# Patient Record
Sex: Male | Born: 1950 | Race: White | Hispanic: No | Marital: Married | Smoking: Former smoker
Health system: Southern US, Community
[De-identification: ages and names within clinical notes are randomized; demographics above are authoritative.]

## PROBLEM LIST (undated history)

## (undated) DIAGNOSIS — G4730 Sleep apnea, unspecified: Secondary | ICD-10-CM

## (undated) DIAGNOSIS — Z973 Presence of spectacles and contact lenses: Secondary | ICD-10-CM

## (undated) DIAGNOSIS — I10 Essential (primary) hypertension: Secondary | ICD-10-CM

## (undated) DIAGNOSIS — R351 Nocturia: Secondary | ICD-10-CM

## (undated) DIAGNOSIS — M069 Rheumatoid arthritis, unspecified: Secondary | ICD-10-CM

## (undated) DIAGNOSIS — E785 Hyperlipidemia, unspecified: Secondary | ICD-10-CM

## (undated) DIAGNOSIS — T7840XA Allergy, unspecified, initial encounter: Secondary | ICD-10-CM

## (undated) DIAGNOSIS — I4891 Unspecified atrial fibrillation: Secondary | ICD-10-CM

## (undated) DIAGNOSIS — Z86010 Personal history of colonic polyps: Secondary | ICD-10-CM

## (undated) DIAGNOSIS — J301 Allergic rhinitis due to pollen: Secondary | ICD-10-CM

## (undated) DIAGNOSIS — K635 Polyp of colon: Secondary | ICD-10-CM

## (undated) DIAGNOSIS — I714 Abdominal aortic aneurysm, without rupture, unspecified: Secondary | ICD-10-CM

## (undated) DIAGNOSIS — I2510 Atherosclerotic heart disease of native coronary artery without angina pectoris: Secondary | ICD-10-CM

## (undated) DIAGNOSIS — I499 Cardiac arrhythmia, unspecified: Secondary | ICD-10-CM

## (undated) HISTORY — DX: Cardiac arrhythmia, unspecified: I49.9

## (undated) HISTORY — PX: INGUINAL HERNIA REPAIR: SUR1180

## (undated) HISTORY — DX: Essential (primary) hypertension: I10

## (undated) HISTORY — DX: Allergy, unspecified, initial encounter: T78.40XA

## (undated) HISTORY — DX: Hyperlipidemia, unspecified: E78.5

## (undated) HISTORY — DX: Abdominal aortic aneurysm, without rupture: I71.4

## (undated) HISTORY — DX: Atherosclerotic heart disease of native coronary artery without angina pectoris: I25.10

## (undated) HISTORY — DX: Personal history of colonic polyps: Z86.010

## (undated) HISTORY — PX: COLONOSCOPY: SHX174

## (undated) HISTORY — DX: Rheumatoid arthritis, unspecified: M06.9

## (undated) HISTORY — PX: TONSILLECTOMY: SUR1361

## (undated) HISTORY — DX: Abdominal aortic aneurysm, without rupture, unspecified: I71.40

## (undated) HISTORY — DX: Unspecified atrial fibrillation: I48.91

---

## 1997-09-30 ENCOUNTER — Encounter: Payer: Self-pay | Admitting: Internal Medicine

## 1999-11-06 ENCOUNTER — Ambulatory Visit (HOSPITAL_COMMUNITY): Admission: RE | Admit: 1999-11-06 | Discharge: 1999-11-06 | Payer: Self-pay | Admitting: Internal Medicine

## 2004-11-06 ENCOUNTER — Encounter: Admission: RE | Admit: 2004-11-06 | Discharge: 2004-11-06 | Payer: Self-pay | Admitting: Rheumatology

## 2004-11-13 ENCOUNTER — Encounter: Admission: RE | Admit: 2004-11-13 | Discharge: 2004-11-13 | Payer: Self-pay | Admitting: Rheumatology

## 2008-06-11 ENCOUNTER — Ambulatory Visit: Payer: Self-pay | Admitting: Internal Medicine

## 2008-06-24 ENCOUNTER — Ambulatory Visit: Payer: Self-pay | Admitting: Internal Medicine

## 2008-06-24 ENCOUNTER — Encounter: Payer: Self-pay | Admitting: Internal Medicine

## 2008-06-30 ENCOUNTER — Encounter: Payer: Self-pay | Admitting: Internal Medicine

## 2009-06-16 ENCOUNTER — Encounter: Payer: Self-pay | Admitting: Internal Medicine

## 2009-06-23 ENCOUNTER — Encounter (INDEPENDENT_AMBULATORY_CARE_PROVIDER_SITE_OTHER): Payer: Self-pay | Admitting: *Deleted

## 2009-10-17 ENCOUNTER — Encounter (INDEPENDENT_AMBULATORY_CARE_PROVIDER_SITE_OTHER): Payer: Self-pay | Admitting: *Deleted

## 2009-11-24 ENCOUNTER — Encounter (INDEPENDENT_AMBULATORY_CARE_PROVIDER_SITE_OTHER): Payer: Self-pay | Admitting: *Deleted

## 2009-11-25 ENCOUNTER — Ambulatory Visit: Payer: Self-pay | Admitting: Internal Medicine

## 2009-12-09 ENCOUNTER — Ambulatory Visit: Payer: Self-pay | Admitting: Internal Medicine

## 2009-12-16 ENCOUNTER — Encounter: Payer: Self-pay | Admitting: Internal Medicine

## 2009-12-21 ENCOUNTER — Telehealth: Payer: Self-pay | Admitting: Internal Medicine

## 2010-10-10 NOTE — Letter (Signed)
Summary: Patient Notice- Polyp Results  [name]  [address] [place], [place] [address]   Phone: [phone]  Fax: [phone]        December 16, 2009 MRN: [id number]    Frederick Bridges [address] [place], [place]  [address]    Dear [name],  I am pleased to inform you that the colon polyp(s) removed during your recent colonoscopy was (were) found to be benign (no cancer detected) upon pathologic examination.  Due to these findings and your history of numerous polyps in 2009, I recommend you have a repeat colonoscopy examination in 3 years to look for recurrent polyps.  Should you develop new or worsening symptoms of abdominal pain, bowel habit changes or bleeding from the rectum or bowels, please schedule an evaluation with either your primary care physician or with me.  Please call us if you are having persistent problems or have questions about your condition that have not been fully answered at this time.  Sincerely,  Iva Boop MD, [facility]  This letter has been electronically signed by your physician.  Appended Document: Patient Notice- Polyp Results letter mailed 4.11.11

## 2010-10-10 NOTE — Letter (Signed)
Summary: Previsit letter  [facility]  [address] [place], [place] [address]   Phone: [phone]  Fax: [phone]       10/17/2009 MRN: [id number]  Frederick Bridges [address] [place], [place]  [address]  Dear Mr. Riffel,  Welcome to the Gastroenterology Division at [facility].    You are scheduled to see a nurse for your pre-procedure visit on 11-25-09 at 8:00a.m. on the 3rd floor at [facility], [address]  We ask that you try to arrive at our office 15 minutes prior to your appointment time to allow for check-in.  Your nurse visit will consist of discussing your medical and surgical history, your immediate family medical history, and your medications.    Please bring a complete list of all your medications or, if you prefer, bring the medication bottles and we will list them.  We will need to be aware of both prescribed and over the counter drugs.  We will need to know exact dosage information as well.  If you are on blood thinners (Coumadin, Plavix, Aggrenox, Ticlid, etc.) please call our office today/prior to your appointment, as we need to consult with your physician about holding your medication.   Please be prepared to read and sign documents such as consent forms, a financial agreement, and acknowledgement forms.  If necessary, and with your consent, a friend or relative is welcome to sit-in on the nurse visit with you.  Please bring your insurance card so that we may make a copy of it.  If your insurance requires a referral to see a specialist, please bring your referral form from your primary care physician.  No co-pay is required for this nurse visit.     If you cannot keep your appointment, please call [phone] to cancel or reschedule prior to your appointment date.  This allows [place] the opportunity to schedule an appointment for another patient in need of care.    Thank you for choosing [facility] for your medical  needs.  We appreciate the opportunity to care for you.  Please visit [place] at our website  to learn more about our practice.                     Sincerely.                                                                                                                   The Gastroenterology Division

## 2010-10-10 NOTE — Progress Notes (Signed)
Summary: call to pt re: genetic testing  Phone Note Outgoing Call   Call placed by: Iva Boop MD, Clementeen Graham,  December 21, 2009 12:02 PM Summary of Call: left message re: discuss possible genetic testing' I will call back his cell [phone]  Follow-up for Phone Call        I explained that the # and type of polyps he has had are an indication to have genetic screening and blood testing (> attenuated FAP or MYH abnormalities) and that it could have implications for his famuily. will send him the info in the mail for him to read. He is not willing to proceed now but will consider Follow-up by: Iva Boop MD, Clementeen Graham,  December 22, 2009 5:38 PM     Appended Document: call to pt re: genetic testing Pamphlet mailed to patient's home

## 2010-10-10 NOTE — Letter (Signed)
Summary: Moviprep Instructions  [facility]  [address]   [place], [place] [address]   Phone: [phone]  Fax: [phone]       Frederick Bridges    10-14-1950    MRN: [id number]        Procedure Day Dorna Bloom: 12-09-09, Friday     Arrival Time: 8:30 a.m.     Procedure Time: 9:30 a.m.     Location of Procedure:                    x    Endoscopy Center (4th Floor)                        PREPARATION FOR COLONOSCOPY WITH MOVIPREP   Starting 5 days prior to your procedure 12-04-09  do not eat nuts, seeds, popcorn, corn, beans, peas,  salads, or any raw vegetables.  Do not take any fiber supplements (e.g. Metamucil, Citrucel, and Benefiber).  THE DAY BEFORE YOUR PROCEDURE         DATE: 12-08-09   DAY: Thursday  1.  Drink clear liquids the entire day-NO SOLID FOOD  2.  Do not drink anything colored red or purple.  Avoid juices with pulp.  No orange juice.  3.  Drink at least 64 oz. (8 glasses) of fluid/clear liquids during the day to prevent dehydration and help the prep work efficiently. No alcohol beverages.  CLEAR LIQUIDS INCLUDE: Water Jello Ice Popsicles Tea (sugar ok, no milk/cream) Powdered fruit flavored drinks Coffee (sugar ok, no milk/cream) Gatorade Juice: apple, white grape, white cranberry  Lemonade Clear bullion, consomm, broth Carbonated beverages (any kind) Strained chicken noodle soup Hard Candy                             4.  In the morning, mix first dose of MoviPrep solution:    Empty 1 Pouch A and 1 Pouch B into the disposable container    Add lukewarm drinking water to the top line of the container. Mix to dissolve    Refrigerate (mixed solution should be used within 24 hrs)  5.  Begin drinking the prep at 5:00 p.m. The MoviPrep container is divided by 4 marks.   Every 15 minutes drink the solution down to the next mark (approximately 8 oz) until the full liter is complete.   6.  Follow completed prep with 16 oz of clear  liquid of your choice (Nothing red or purple).  Continue to drink clear liquids until bedtime.  7.  Before going to bed, mix second dose of MoviPrep solution:    Empty 1 Pouch A and 1 Pouch B into the disposable container    Add lukewarm drinking water to the top line of the container. Mix to dissolve    Refrigerate  THE DAY OF YOUR PROCEDURE      DATE: 12-09-09   DAY: Friday  Beginning at 3:00 a.m. (5 hours before procedure):         1. Every 15 minutes, drink the solution down to the next mark (approx 8 oz) until the full liter is complete.  2. Follow completed prep with 16 oz. of clear liquid of your choice.    3. You may drink clear liquids until 6:00 a.m.  (2 HOURS BEFORE PROCEDURE).   MEDICATION INSTRUCTIONS  Unless otherwise instructed, you should take regular prescription medications with a small sip of water  as early as possible the morning of your procedure.   Additional medication instructions: n/a         OTHER INSTRUCTIONS  You will need a responsible adult at least 59 years of age to accompany you and drive you home.   This person must remain in the waiting room during your procedure.  Wear loose fitting clothing that is easily removed.  Leave jewelry and other valuables at home.  However, you may wish to bring a book to read or  an iPod/MP3 player to listen to music as you wait for your procedure to start.  Remove all body piercing jewelry and leave at home.  Total time from sign-in until discharge is approximately 2-3 hours.  You should go home directly after your procedure and rest.  You can resume normal activities the  day after your procedure.  The day of your procedure you should not:   Drive   Make legal decisions   Operate machinery   Drink alcohol   Return to work  You will receive specific instructions about eating, activities and medications before you leave.    The above instructions have been reviewed and explained to me by    Sherren Kerns RN  November 25, 2009 8:14 AM    I fully understand and can verbalize these instructions _____________________________ Date _________

## 2010-10-10 NOTE — Procedures (Signed)
Summary: Colonoscopy  Patient: Frederick Bridges Note: All result statuses are Final unless otherwise noted.  Tests: (1) Colonoscopy (COL)   COL Colonoscopy           DONE      Endoscopy Center     [address]     [place], [place]  [address]           COLONOSCOPY PROCEDURE REPORT           PATIENT:  [name], [name]  MR#:  [id number]     BIRTHDATE:  1951/07/17, 58 yrs. old  GENDER:  male     ENDOSCOPIST:  Iva Boop, MD, [facility]           PROCEDURE DATE:  12/09/2009     PROCEDURE:  Colonoscopy with biopsy and snare polypectomy     ASA CLASS:  Class II     INDICATIONS:  history of pre-cancerous (adenomatous) colon polyps,     surveillance and screening 15 polyps removed 10/09     for closer follow-up due to # of polyps, all recivered were     adenomas or sessile serrated polyps     he is also immunosuppressed on Embral for rheumatoid arthritis     MEDICATIONS:   Fentanyl 75 mcg IV, Versed 8 mg IV           DESCRIPTION OF PROCEDURE:   After the risks benefits and     alternatives of the procedure were thoroughly explained, informed     consent was obtained.  Digital rectal exam was performed and     revealed no abnormalities and normal prostate.   The LB [id number]     [id number] endoscope was introduced through the anus and advanced to     the cecum, which was identified by both the appendix and ileocecal     valve, without limitations.  The quality of the prep was     excellent, using MoviPrep.  The instrument was then slowly     withdrawn as the colon was fully examined.     Insertion: 2:06 minutes Withdrawal: 20:25 minutes     <<PROCEDUREIMAGES>>           FINDINGS:  Four polyps were found. 3mm ascending polyp, 12 mm     hepatic flexure polyp (identified by right colon retroflex exam),     10 mm hepatic flexure polyp, 7 mm transverse polyp. The 3 mm polyp     was removed using cold biopsy forceps. The other polyps were     snared, then cauterized with monopolar cautery.  Retrieval was     successful. Mild diverticulosis was found in the sigmoid colon.     This was otherwise a normal examination of the colon.   Retroflexed     views in the rectum revealed no abnormalities.    The scope was     then withdrawn from the patient and the procedure completed.           COMPLICATIONS:  None     ENDOSCOPIC IMPRESSION:     1) Four polyps removed (48mm-12mm sizes)     2) Mild diverticulosis in the sigmoid colon     3) Otherwise normal examination           4) 15 polyps removed 10/09 (adenomas and sessile serrated     polyps)           RECOMMENDATIONS:     Consider genetic testing pending pathology review.  REPEAT EXAM:  In for Colonoscopy, pending biopsy results.           Iva Boop, MD, Clementeen Graham           CC:  Jarome Matin, MD     Zenovia Jordan, MD     The Patient           n.     eSIGNED:   Iva Boop at 12/09/2009 09:51 AM           Evette Doffing, [id number]  Note: An exclamation mark (!) indicates a result that was not dispersed into the flowsheet. Document Creation Date: 12/09/2009 9:52 AM _______________________________________________________________________  (1) Order result status: Final Collection or observation date-time: 12/09/2009 09:40 Requested date-time:  Receipt date-time:  Reported date-time:  Referring Physician:   Ordering Physician: Stan Head [phone]) Specimen Source:  Source: Launa Grill Order Number: [id number] Lab site:   Appended Document: Colonoscopy     Procedures Next Due Date:    Colonoscopy: 12/2012

## 2010-10-10 NOTE — Letter (Signed)
Summary: CVTS  CVTS   Imported By: Lester Jay 06/15/2010 09:02:41  _____________________________________________________________________  External Attachment:    Type:   Image     Comment:   External Document

## 2010-10-10 NOTE — Procedures (Signed)
Summary: Recall / [facility]  Recall / [facility]   Imported By: Lennie Odor 02/09/2010 15:55:22  _____________________________________________________________________  External Attachment:    Type:   Image     Comment:   External Document

## 2010-10-10 NOTE — Miscellaneous (Signed)
Summary: previsit/rm  Clinical Lists Changes  Medications: Added new medication of MOVIPREP 100 GM  SOLR (PEG-KCL-NACL-NASULF-NA ASC-C) As per prep instructions. - Signed Rx of MOVIPREP 100 GM  SOLR (PEG-KCL-NACL-NASULF-NA ASC-C) As per prep instructions.;  #1 x 0;  Signed;  Entered by: Sherren Kerns RN;  Authorized by: Iva Boop MD, [facility];  Method used: Electronically to [facility] # [phone]*, [address], [place], [place]  [address], Ph: [phone], Fax: [phone] Observations: Added new observation of ALLERGY REV: Done (11/25/2009 7:50)    Prescriptions: MOVIPREP 100 GM  SOLR (PEG-KCL-NACL-NASULF-NA ASC-C) As per prep instructions.  #1 x 0   Entered by:   Sherren Kerns RN   Authorized by:   Iva Boop MD, [facility]   Signed by:   Sherren Kerns RN on 11/25/2009   Method used:   Electronically to        [facility] # [phone]* (retail)       [address]       [place], [place]  [address]       Ph: [phone]       Fax: [phone]   RxID:   [id number]

## 2012-12-22 ENCOUNTER — Encounter: Payer: Self-pay | Admitting: Internal Medicine

## 2012-12-22 DIAGNOSIS — Z860100 Personal history of colon polyps, unspecified: Secondary | ICD-10-CM

## 2012-12-22 HISTORY — DX: Personal history of colon polyps, unspecified: Z86.0100

## 2012-12-22 HISTORY — DX: Personal history of colonic polyps: Z86.010

## 2012-12-23 ENCOUNTER — Encounter: Payer: Self-pay | Admitting: Internal Medicine

## 2013-07-14 ENCOUNTER — Encounter: Payer: Self-pay | Admitting: Internal Medicine

## 2014-03-10 ENCOUNTER — Encounter: Payer: Self-pay | Admitting: Internal Medicine

## 2014-04-30 ENCOUNTER — Ambulatory Visit (AMBULATORY_SURGERY_CENTER): Payer: Self-pay

## 2014-04-30 VITALS — Ht 70.0 in | Wt 213.0 lb

## 2014-04-30 DIAGNOSIS — Z860100 Personal history of colon polyps, unspecified: Secondary | ICD-10-CM

## 2014-04-30 MED ORDER — SUPREP BOWEL PREP KIT 17.5-3.13-1.6 GM/177ML PO SOLN: 1.0000 | Freq: Once | ORAL | Status: DC

## 2014-04-30 NOTE — Progress Notes (Signed)
No allergies to eggs or soy No past problems with anesthesia No home oxygen No diet/weight loss meds  Has email  Emmi instructions given for colonoscopy 

## 2014-05-14 ENCOUNTER — Ambulatory Visit (AMBULATORY_SURGERY_CENTER): Payer: 59 | Admitting: Internal Medicine

## 2014-05-14 ENCOUNTER — Encounter: Payer: Self-pay | Admitting: Internal Medicine

## 2014-05-14 VITALS — BP 117/74 | HR 55 | Temp 97.5°F | Resp 19 | Ht 70.0 in | Wt 213.0 lb

## 2014-05-14 DIAGNOSIS — D126 Benign neoplasm of colon, unspecified: Secondary | ICD-10-CM

## 2014-05-14 DIAGNOSIS — D125 Benign neoplasm of sigmoid colon: Secondary | ICD-10-CM

## 2014-05-14 DIAGNOSIS — D120 Benign neoplasm of cecum: Secondary | ICD-10-CM

## 2014-05-14 DIAGNOSIS — Z86010 Personal history of colonic polyps: Secondary | ICD-10-CM

## 2014-05-14 MED ORDER — SODIUM CHLORIDE 0.9 % IV SOLN: 500.0000 mL | INTRAVENOUS | Status: DC

## 2014-05-14 NOTE — Patient Instructions (Addendum)
I found and removed 2 tiny polyps. I will let you know pathology results and when to have another routine colonoscopy by mail. Suspect it will be 2020.  I appreciate the opportunity to care for you. Gatha Mayer, MD, FACG  YOU HAD AN ENDOSCOPIC PROCEDURE TODAY AT [facility]: Refer to the procedure report that was given to you for any specific questions about what was found during the examination.  If the procedure report does not answer your questions, please call your gastroenterologist to clarify.  If you requested that your care partner not be given the details of your procedure findings, then the procedure report has been included in a sealed envelope for you to review at your convenience later.  YOU SHOULD EXPECT: Some feelings of bloating in the abdomen. Passage of more gas than usual.  Walking can help get rid of the air that was put into your GI tract during the procedure and reduce the bloating. If you had a lower endoscopy (such as a colonoscopy or flexible sigmoidoscopy) you may notice spotting of blood in your stool or on the toilet paper. If you underwent a bowel prep for your procedure, then you may not have a normal bowel movement for a few days.  DIET: Your first meal following the procedure should be a light meal and then it is ok to progress to your normal diet.  A half-sandwich or bowl of soup is an example of a good first meal.  Heavy or fried foods are harder to digest and may make you feel nauseous or bloated.  Likewise meals heavy in dairy and vegetables can cause extra gas to form and this can also increase the bloating.  Drink plenty of fluids but you should avoid alcoholic beverages for 24 hours.  ACTIVITY: Your care partner should take you home directly after the procedure.  You should plan to take it easy, moving slowly for the rest of the day.  You can resume normal activity the day after the procedure however you should NOT DRIVE or use heavy machinery  for 24 hours (because of the sedation medicines used during the test).    SYMPTOMS TO REPORT IMMEDIATELY: A gastroenterologist can be reached at any hour.  During normal business hours, 8:30 AM to 5:00 PM Monday through Friday, call [phone].  After hours and on weekends, please call the GI answering service at [phone] who will take a message and have the physician on call contact you.   Following lower endoscopy (colonoscopy or flexible sigmoidoscopy):  Excessive amounts of blood in the stool  Significant tenderness or worsening of abdominal pains  Swelling of the abdomen that is new, acute  Fever of 100F or higher  Following upper endoscopy (EGD)  Vomiting of blood or coffee ground material  New chest pain or pain under the shoulder blades  Painful or persistently difficult swallowing  New shortness of breath  Fever of 100F or higher  Black, tarry-looking stools  FOLLOW UP: If any biopsies were taken you will be contacted by phone or by letter within the next 1-3 weeks.  Call your gastroenterologist if you have not heard about the biopsies in 3 weeks.  Our staff will call the home number listed on your records the next business day following your procedure to check on you and address any questions or concerns that you may have at that time regarding the information given to you following your procedure. This is a courtesy call and  so if there is no answer at the home number and we have not heard from you through the emergency physician on call, we will assume that you have returned to your regular daily activities without incident.  SIGNATURES/CONFIDENTIALITY: You and/or your care partner have signed paperwork which will be entered into your electronic medical record.  These signatures attest to the fact that that the information above on your After Visit Summary has been reviewed and is understood.  Full responsibility of the confidentiality of this discharge information  lies with you and/or your care-partner.    Information on polyps given to you today

## 2014-05-14 NOTE — Progress Notes (Signed)
Report to PACU, RN, vss, BBS= Clear.  

## 2014-05-14 NOTE — Op Note (Signed)
[place]  [address] [place], [address]   COLONOSCOPY PROCEDURE REPORT  PATIENT: Frederick, Bridges  MR#: [id number] BIRTHDATE: [birth year] , 62  yrs. old GENDER: Male ENDOSCOPIST: Gatha Mayer, MD, [facility] PROCEDURE DATE:  05/14/2014 PROCEDURE:   Colonoscopy with biopsy and snare polypectomy First Screening Colonoscopy - Avg.  risk and is 50 yrs.  old or older - No.  Prior Negative Screening - Now for repeat screening. N/A  History of Adenoma - Now for follow-up colonoscopy & has been > or = to 3 yrs.  Yes hx of adenoma.  Has been 3 or more years since last colonoscopy.  Polyps Removed Today? Yes. ASA CLASS:   Class II INDICATIONS:Patient's personal history of adenomatous colon polyps.  MEDICATIONS: Propofol (Diprivan) 220 mg IV, MAC sedation, administered by CRNA, and These medications were titrated to patient response per physician's verbal order  DESCRIPTION OF PROCEDURE:   After the risks benefits and alternatives of the procedure were thoroughly explained, informed consent was obtained.  A digital rectal exam revealed no abnormalities of the rectum, A digital rectal exam revealed no prostatic nodules, and A digital rectal exam revealed the prostate was not enlarged.   The LB [id number]  endoscope was introduced through the anus and advanced to the cecum, which was identified by both the appendix and ileocecal valve. No adverse events experienced.   The quality of the prep was Suprep good  The instrument was then slowly withdrawn as the colon was fully examined.  COLON FINDINGS: Two sessile polyps measuring 2 and 5 mm in size were found at the cecum and in the sigmoid colon.  A polypectomy was performed with cold forceps and with a cold snare.  The resection was complete and the polyp tissue was completely retrieved.   The colon mucosa was otherwise normal.  Retroflexed views revealed no abnormalities. The time to cecum=2 minutes 54 seconds.   Withdrawal time=10 minutes 02 seconds.  The scope was withdrawn and the procedure completed. COMPLICATIONS: There were no complications.  ENDOSCOPIC IMPRESSION: 1.   Two sessile polyps measuring 2 and 5 mm in size were found at the cecum and in the sigmoid colon; polypectomy was performed with cold forceps and with a cold snare 2.   The colon mucosa was otherwise normal - hx adenomas and SSP's - 2009 and 2011  RECOMMENDATIONS: 1.  Timing of repeat colonoscopy will be determined by pathology findings. 2.   Probably 5 years   eSigned:  Gatha Mayer, MD, [facility] 05/14/2014 2:22 PM   cc: The Patient  and Janie Morning, MD

## 2014-05-14 NOTE — Progress Notes (Signed)
Called to room to assist during endoscopic procedure.  Patient ID and intended procedure confirmed with present staff. Received instructions for my participation in the procedure from the performing physician.  

## 2014-05-18 ENCOUNTER — Telehealth: Payer: Self-pay

## 2014-05-18 NOTE — Telephone Encounter (Signed)
Left a message at [phone] for the pt to call back if any questions or concerns. maw

## 2014-05-20 ENCOUNTER — Encounter: Payer: Self-pay | Admitting: Internal Medicine

## 2014-05-20 NOTE — Progress Notes (Signed)
Quick Note:  2 hyperplastic polyps - one cecum and one sigmoid Repeat colon 2020 - hx ssp's in past, ? If cecal polyp ssp now ______

## 2014-06-10 ENCOUNTER — Encounter: Payer: Self-pay | Admitting: Internal Medicine

## 2015-06-28 ENCOUNTER — Other Ambulatory Visit (HOSPITAL_COMMUNITY): Payer: Self-pay | Admitting: Surgery

## 2015-07-11 ENCOUNTER — Encounter (HOSPITAL_COMMUNITY)
Admission: RE | Admit: 2015-07-11 | Discharge: 2015-07-11 | Disposition: A | Discharge status: Home / Self Care | Payer: 59 | Source: Ambulatory Visit | Attending: Surgery | Admitting: Surgery

## 2015-07-11 MED ORDER — TECHNETIUM TC 99M SESTAMIBI - CARDIOLITE
26.9000 | Freq: Once | INTRAVENOUS | Status: AC | PRN
  Administered 2015-07-11: 09:00:00 26.9 via INTRAVENOUS

## 2015-07-25 ENCOUNTER — Ambulatory Visit: Payer: Self-pay | Admitting: Surgery

## 2015-08-25 NOTE — Pre-Procedure Instructions (Signed)
    Emiel Leong Lim  08/25/2015      [id number] - [place], [place] - [address] AT [address] [place] [address] Phone: [phone] Fax: [phone]    Your procedure is scheduled on Friday, September 02, 2015  Report to [facility] Admitting at 5:30 A.M.  Call this number if you have problems the morning of surgery:  [phone]   Remember:  Do not eat food or drink liquids after midnight Thursday, September 01, 2015  Take these medicines the morning of surgery with A SIP OF WATER : NONE  Stop taking Aspirin,vitamins, fish oil, and herbal medications. Do not take any NSAIDs ie: Ibuprofen, Advil, Naproxen or any medication containing Aspirin; stop now.   Do not wear jewelry, make-up or nail polish.  Do not wear lotions, powders, or perfumes.  You may wear deodorant.  Do not shave 48 hours prior to surgery.  Men may shave face and neck.  Do not bring valuables to the hospital.  [facility] is not responsible for any belongings or valuables.  Contacts, dentures or bridgework may not be worn into surgery.  Leave your suitcase in the car.  After surgery it may be brought to your room.  For patients admitted to the hospital, discharge time will be determined by your treatment team.  Patients discharged the day of surgery will not be allowed to drive home.   Name and phone number of your driver:   Special instructions: Shower the night before surgery and the morning of surgery with CHG.  Please read over the following fact sheets that you were given. Pain Booklet, Coughing and Deep Breathing and Surgical Site Infection Prevention

## 2015-08-26 ENCOUNTER — Encounter (HOSPITAL_COMMUNITY)
Admission: RE | Admit: 2015-08-26 | Discharge: 2015-08-26 | Disposition: A | Discharge status: Home / Self Care | Payer: 59 | Source: Ambulatory Visit | Attending: Surgery | Admitting: Surgery

## 2015-08-26 ENCOUNTER — Encounter (HOSPITAL_COMMUNITY): Payer: Self-pay

## 2015-08-26 DIAGNOSIS — Z01812 Encounter for preprocedural laboratory examination: Secondary | ICD-10-CM | POA: Insufficient documentation

## 2015-08-26 DIAGNOSIS — E210 Primary hyperparathyroidism: Secondary | ICD-10-CM | POA: Insufficient documentation

## 2015-08-26 HISTORY — DX: Presence of spectacles and contact lenses: Z97.3

## 2015-08-26 HISTORY — DX: Polyp of colon: K63.5

## 2015-08-26 HISTORY — DX: Allergic rhinitis due to pollen: J30.1

## 2015-08-26 HISTORY — DX: Nocturia: R35.1

## 2015-08-26 LAB — COMPREHENSIVE METABOLIC PANEL
ALT: 19 U/L (ref 17–63)
AST: 20 U/L (ref 15–41)
Albumin: 3.8 g/dL (ref 3.5–5.0)
Alkaline Phosphatase: 63 U/L (ref 38–126)
Anion gap: 8 (ref 5–15)
BUN: 14 mg/dL (ref 6–20)
CO2: 24 mmol/L (ref 22–32)
Calcium: 11 mg/dL — ABNORMAL HIGH (ref 8.9–10.3)
Chloride: 105 mmol/L (ref 101–111)
Creatinine, Ser: 0.83 mg/dL (ref 0.61–1.24)
GFR calc Af Amer: >60 mL/min (ref >60)
GFR calc non Af Amer: >60 mL/min (ref >60)
Glucose, Bld: 109 mg/dL — ABNORMAL HIGH (ref 65–99)
Potassium: 4.3 mmol/L (ref 3.5–5.1)
Sodium: 137 mmol/L (ref 135–145)
Total Bilirubin: 0.6 mg/dL (ref 0.3–1.2)
Total Protein: 7.3 g/dL (ref 6.5–8.1)

## 2015-08-26 LAB — CBC
HCT: 46.5 % (ref 39.0–52.0)
Hemoglobin: 15.6 g/dL (ref 13.0–17.0)
MCH: 31.1 pg (ref 26.0–34.0)
MCHC: 33.5 g/dL (ref 30.0–36.0)
MCV: 92.8 fL (ref 78.0–100.0)
Platelets: 263 10*3/uL (ref 150–400)
RBC: 5.01 MIL/uL (ref 4.22–5.81)
RDW: 12.6 % (ref 11.5–15.5)
WBC: 6.7 10*3/uL (ref 4.0–10.5)

## 2015-08-26 NOTE — Progress Notes (Addendum)
PCP - Dr. [name] at [facility] Cardiologist - denies  EKG/CXR - denies Echo - denies Stress test- requested from Dr. Terance Hart Cardiac cath - denies  Patient denies chest pain and shortness of breath at PAT appointment.

## 2015-08-30 ENCOUNTER — Encounter (HOSPITAL_COMMUNITY): Payer: Self-pay | Admitting: Surgery

## 2015-08-30 DIAGNOSIS — E210 Primary hyperparathyroidism: Secondary | ICD-10-CM | POA: Diagnosis present

## 2015-08-30 NOTE — H&P (Signed)
General Surgery [facility]  Frederick Bridges DOB: [birth year] Married / Language: English / Race: White Male  History of Present Illness The patient is a 64 year old male who presents with a parathyroid neoplasm.  Patient referred by Dr. Leanna Bridges for evaluation of suspected primary hyperparathyroidism. Patient was noted over the past several years to have elevated serum calcium levels ranging from 10.8-11.9. Patient had an intact PTH level checked which was in the upper range of normal at 48. Patient has had no other diagnostic studies. Patient denies any prior history of endocrine disease. He has had no prior head or neck surgery. There is no family history of endocrine disease and no family history of endocrine neoplasm. Patient notes mild fatigue. He does note bone and joint pain. He has had a bone density scan a few years ago which showed osteopenia. He has had one episode of nephrolithiasis. Patient presents today for further evaluation and recommendations.   Other Problems Arthritis Back Pain Hemorrhoids Kidney Stone Umbilical Hernia Repair  Past Surgical History  Colon Polyp Removal - Colonoscopy Oral Surgery Tonsillectomy Ventral / Umbilical Hernia Surgery Left.  Diagnostic Studies History  Colonoscopy within last year  Allergies No Known Drug Allergies10/17/2016  Medication History Etanercept (50MG /ML Solution, Subcutaneous) Active. Multiple Vitamin (Oral once a week) Active. Medications Reconciled  Social History Alcohol use Moderate alcohol use. Caffeine use Carbonated beverages, Coffee, Tea. Illicit drug use Remotely quit drug use. Tobacco use Former smoker.  Family History Arthritis Mother. Cancer Brother, Mother. Diabetes Mellitus Daughter. Respiratory Condition Brother, Mother.  Review of Systems General Present- Weight Gain. Not Present- Appetite Loss, Chills, Fatigue, Fever, Night Sweats  and Weight Loss. Skin Not Present- Change in Wart/Mole, Dryness, Hives, Jaundice, New Lesions, Non-Healing Wounds, Rash and Ulcer. HEENT Present- Seasonal Allergies and Wears glasses/contact lenses. Not Present- Earache, Hearing Loss, Hoarseness, Nose Bleed, Oral Ulcers, Ringing in the Ears, Sinus Pain, Sore Throat, Visual Disturbances and Yellow Eyes. Respiratory Present- Snoring. Not Present- Bloody sputum, Chronic Cough, Difficulty Breathing and Wheezing. Breast Not Present- Breast Mass, Breast Pain, Nipple Discharge and Skin Changes. Cardiovascular Not Present- Chest Pain, Difficulty Breathing Lying Down, Leg Cramps, Palpitations, Rapid Heart Rate, Shortness of Breath and Swelling of Extremities. Gastrointestinal Present- Hemorrhoids. Not Present- Abdominal Pain, Bloating, Bloody Stool, Change in Bowel Habits, Chronic diarrhea, Constipation, Difficulty Swallowing, Excessive gas, Gets full quickly at meals, Indigestion, Nausea, Rectal Pain and Vomiting. Male Genitourinary Not Present- Blood in Urine, Change in Urinary Stream, Frequency, Impotence, Nocturia, Painful Urination, Urgency and Urine Leakage. Musculoskeletal Present- Back Pain, Joint Stiffness and Muscle Pain. Not Present- Joint Pain, Muscle Weakness and Swelling of Extremities. Neurological Not Present- Decreased Memory, Fainting, Headaches, Numbness, Seizures, Tingling, Tremor, Trouble walking and Weakness. Psychiatric Not Present- Anxiety, Bipolar, Change in Sleep Pattern, Depression, Fearful and Frequent crying. Endocrine Not Present- Cold Intolerance, Excessive Hunger, Hair Changes, Heat Intolerance, Hot flashes and New Diabetes. Hematology Not Present- Easy Bruising, Excessive bleeding, Gland problems, HIV and Persistent Infections.  Vitals Weight: 221 lb Height: 70in Body Surface Area: 2.18 m Body Mass Index: 31.71 kg/m  Temp.: 97.59F(Temporal)  Pulse: 88 (Regular)  BP: 130/70 (Sitting, Left Arm,  Standard)   Physical Exam   General - appears comfortable, no distress; not diaphorectic  HEENT - normocephalic; sclerae clear, gaze conjugate; mucous membranes moist, dentition good; voice normal  Neck - symmetric on extension; no palpable anterior or posterior cervical adenopathy; no palpable masses in the thyroid bed  Chest - clear bilaterally  without rhonchi, rales, or wheeze  Cor - regular rhythm with normal rate; no significant murmur  Ext - non-tender without significant edema or lymphedema  Neuro - grossly intact; no tremor    Assessment & Plan  PRIMARY HYPERPARATHYROIDISM (E21.0) HYPERCALCEMIA (E83.52)  Patient presents on referral from his primary care physician for evaluation of hypercalcemia and suspected primary hyperparathyroidism. Patient is provided with written literature on parathyroid disease to review at home.  Patient likely has primary hyperparathyroidism based on hypercalcemia and an inappropriately normal intact PTH level. We will order a 25-hydroxy vitamin D level, a 24-hour urine collection for calcium, and arrange for a nuclear medicine parathyroid scan. Once these results are available, I will contact the patient.  The patient and I discussed possible minimally invasive parathyroidectomy as an outpatient surgical procedure. We will await the results of his diagnostic testing before making any further arrangements for surgical intervention.  Earnstine Regal, MD, [facility] Office: [phone]

## 2015-09-01 MED ORDER — CEFAZOLIN SODIUM-DEXTROSE 2-3 GM-% IV SOLR
2.0000 g | INTRAVENOUS | Status: AC
  Administered 2015-09-02: 2 g via INTRAVENOUS
  Filled 2015-09-01: qty 50

## 2015-09-02 ENCOUNTER — Ambulatory Visit (HOSPITAL_COMMUNITY)
Admission: RE | Admit: 2015-09-02 | Discharge: 2015-09-02 | Disposition: A | Discharge status: Home / Self Care | Payer: 59 | Source: Ambulatory Visit | Attending: Surgery | Admitting: Surgery

## 2015-09-02 ENCOUNTER — Encounter (HOSPITAL_COMMUNITY)
Admission: RE | Disposition: A | Discharge status: Home / Self Care | Payer: Self-pay | Source: Ambulatory Visit | Attending: Surgery

## 2015-09-02 ENCOUNTER — Ambulatory Visit (HOSPITAL_COMMUNITY): Payer: 59 | Admitting: Certified Registered Nurse Anesthetist

## 2015-09-02 ENCOUNTER — Encounter (HOSPITAL_COMMUNITY): Payer: Self-pay | Admitting: *Deleted

## 2015-09-02 DIAGNOSIS — D351 Benign neoplasm of parathyroid gland: Secondary | ICD-10-CM | POA: Diagnosis not present

## 2015-09-02 DIAGNOSIS — E210 Primary hyperparathyroidism: Secondary | ICD-10-CM | POA: Diagnosis present

## 2015-09-02 DIAGNOSIS — Z87891 Personal history of nicotine dependence: Secondary | ICD-10-CM | POA: Insufficient documentation

## 2015-09-02 DIAGNOSIS — Z79899 Other long term (current) drug therapy: Secondary | ICD-10-CM | POA: Diagnosis not present

## 2015-09-02 DIAGNOSIS — M1990 Unspecified osteoarthritis, unspecified site: Secondary | ICD-10-CM | POA: Diagnosis not present

## 2015-09-02 HISTORY — PX: PARATHYROIDECTOMY: SHX19

## 2015-09-02 SURGERY — PARATHYROIDECTOMY
Anesthesia: General

## 2015-09-02 MED ORDER — OXYCODONE HCL 5 MG PO TABS: 5.0000 mg | ORAL_TABLET | Freq: Once | ORAL | Status: DC | PRN

## 2015-09-02 MED ORDER — FENTANYL CITRATE (PF) 100 MCG/2ML IJ SOLN
INTRAMUSCULAR | Status: DC | PRN
  Administered 2015-09-02: 50 ug via INTRAVENOUS
  Administered 2015-09-02: 100 ug via INTRAVENOUS

## 2015-09-02 MED ORDER — NEOSTIGMINE METHYLSULFATE 10 MG/10ML IV SOLN
INTRAVENOUS | Status: AC
  Filled 2015-09-02: qty 1

## 2015-09-02 MED ORDER — LIDOCAINE HCL 4 % EX SOLN
CUTANEOUS | Status: DC | PRN
  Administered 2015-09-02: 4 mL via TOPICAL

## 2015-09-02 MED ORDER — FENTANYL CITRATE (PF) 100 MCG/2ML IJ SOLN: 25.0000 ug | INTRAMUSCULAR | Status: DC | PRN

## 2015-09-02 MED ORDER — 0.9 % SODIUM CHLORIDE (POUR BTL) OPTIME
TOPICAL | Status: DC | PRN
  Administered 2015-09-02: 1000 mL

## 2015-09-02 MED ORDER — SUCCINYLCHOLINE CHLORIDE 20 MG/ML IJ SOLN
INTRAMUSCULAR | Status: AC
  Filled 2015-09-02: qty 1

## 2015-09-02 MED ORDER — ACETAMINOPHEN 160 MG/5ML PO SOLN
325.0000 mg | ORAL | Status: DC | PRN
  Filled 2015-09-02: qty 20.3

## 2015-09-02 MED ORDER — PROPOFOL 10 MG/ML IV BOLUS
INTRAVENOUS | Status: AC
  Filled 2015-09-02: qty 40

## 2015-09-02 MED ORDER — BUPIVACAINE HCL (PF) 0.25 % IJ SOLN
INTRAMUSCULAR | Status: AC
  Filled 2015-09-02: qty 30

## 2015-09-02 MED ORDER — ONDANSETRON HCL 4 MG/2ML IJ SOLN
INTRAMUSCULAR | Status: DC | PRN
  Administered 2015-09-02: 4 mg via INTRAVENOUS

## 2015-09-02 MED ORDER — HYDROCODONE-ACETAMINOPHEN 5-325 MG PO TABS: 1.0000 | ORAL_TABLET | ORAL | Status: DC | PRN

## 2015-09-02 MED ORDER — MIDAZOLAM HCL 2 MG/2ML IJ SOLN
INTRAMUSCULAR | Status: AC
  Filled 2015-09-02: qty 2

## 2015-09-02 MED ORDER — MIDAZOLAM HCL 5 MG/5ML IJ SOLN
INTRAMUSCULAR | Status: DC | PRN
  Administered 2015-09-02: 2 mg via INTRAVENOUS

## 2015-09-02 MED ORDER — SODIUM CHLORIDE 0.9 % IJ SOLN
INTRAMUSCULAR | Status: AC
  Filled 2015-09-02: qty 10

## 2015-09-02 MED ORDER — PROPOFOL 10 MG/ML IV BOLUS
INTRAVENOUS | Status: DC | PRN
  Administered 2015-09-02: 140 mg via INTRAVENOUS

## 2015-09-02 MED ORDER — EPHEDRINE SULFATE 50 MG/ML IJ SOLN
INTRAMUSCULAR | Status: AC
  Filled 2015-09-02: qty 1

## 2015-09-02 MED ORDER — PHENYLEPHRINE HCL 10 MG/ML IJ SOLN
INTRAMUSCULAR | Status: DC | PRN
  Administered 2015-09-02: 120 ug via INTRAVENOUS
  Administered 2015-09-02: 80 ug via INTRAVENOUS

## 2015-09-02 MED ORDER — ACETAMINOPHEN 325 MG PO TABS: 325.0000 mg | ORAL_TABLET | ORAL | Status: DC | PRN

## 2015-09-02 MED ORDER — NEOSTIGMINE METHYLSULFATE 10 MG/10ML IV SOLN
INTRAVENOUS | Status: DC | PRN
  Administered 2015-09-02: 4 mg via INTRAVENOUS

## 2015-09-02 MED ORDER — LIDOCAINE HCL (CARDIAC) 20 MG/ML IV SOLN
INTRAVENOUS | Status: AC
  Filled 2015-09-02: qty 5

## 2015-09-02 MED ORDER — ONDANSETRON HCL 4 MG/2ML IJ SOLN
INTRAMUSCULAR | Status: AC
  Filled 2015-09-02: qty 2

## 2015-09-02 MED ORDER — GLYCOPYRROLATE 0.2 MG/ML IJ SOLN
INTRAMUSCULAR | Status: AC
  Filled 2015-09-02: qty 3

## 2015-09-02 MED ORDER — ROCURONIUM BROMIDE 50 MG/5ML IV SOLN
INTRAVENOUS | Status: AC
  Filled 2015-09-02: qty 1

## 2015-09-02 MED ORDER — LIDOCAINE HCL (CARDIAC) 20 MG/ML IV SOLN
INTRAVENOUS | Status: DC | PRN
  Administered 2015-09-02: 70 mg via INTRAVENOUS

## 2015-09-02 MED ORDER — ROCURONIUM BROMIDE 100 MG/10ML IV SOLN
INTRAVENOUS | Status: DC | PRN
  Administered 2015-09-02: 50 mg via INTRAVENOUS

## 2015-09-02 MED ORDER — PHENYLEPHRINE 40 MCG/ML (10ML) SYRINGE FOR IV PUSH (FOR BLOOD PRESSURE SUPPORT)
PREFILLED_SYRINGE | INTRAVENOUS | Status: AC
  Filled 2015-09-02: qty 10

## 2015-09-02 MED ORDER — BUPIVACAINE HCL (PF) 0.25 % IJ SOLN
INTRAMUSCULAR | Status: DC | PRN
  Administered 2015-09-02: 10 mL

## 2015-09-02 MED ORDER — OXYCODONE HCL 5 MG/5ML PO SOLN: 5.0000 mg | Freq: Once | ORAL | Status: DC | PRN

## 2015-09-02 MED ORDER — GLYCOPYRROLATE 0.2 MG/ML IJ SOLN
INTRAMUSCULAR | Status: DC | PRN
  Administered 2015-09-02: 0.6 mg via INTRAVENOUS

## 2015-09-02 MED ORDER — ARTIFICIAL TEARS OP OINT
TOPICAL_OINTMENT | OPHTHALMIC | Status: DC | PRN
  Administered 2015-09-02: 1 via OPHTHALMIC

## 2015-09-02 MED ORDER — LACTATED RINGERS IV SOLN
INTRAVENOUS | Status: DC | PRN
  Administered 2015-09-02: 07:00:00 via INTRAVENOUS

## 2015-09-02 MED ORDER — FENTANYL CITRATE (PF) 250 MCG/5ML IJ SOLN
INTRAMUSCULAR | Status: AC
  Filled 2015-09-02: qty 5

## 2015-09-02 MED ORDER — ARTIFICIAL TEARS OP OINT
TOPICAL_OINTMENT | OPHTHALMIC | Status: AC
  Filled 2015-09-02: qty 3.5

## 2015-09-02 SURGICAL SUPPLY — 49 items
ATTRACTOMAT 16X20 MAGNETIC DRP (DRAPES) IMPLANT
BENZOIN TINCTURE PRP APPL 2/3 (GAUZE/BANDAGES/DRESSINGS) ×2 IMPLANT
BLADE SURG 10 STRL SS (BLADE) ×2 IMPLANT
BLADE SURG 15 STRL LF DISP TIS (BLADE) ×1 IMPLANT
BLADE SURG 15 STRL SS (BLADE) ×1
CANISTER SUCTION 2500CC (MISCELLANEOUS) ×2 IMPLANT
CHLORAPREP W/TINT 26ML (MISCELLANEOUS) ×2 IMPLANT
CLIP TI MEDIUM 6 (CLIP) ×2 IMPLANT
CLIP TI WIDE RED SMALL 6 (CLIP) ×2 IMPLANT
CONT SPEC 4OZ CLIKSEAL STRL BL (MISCELLANEOUS) ×2 IMPLANT
COVER SURGICAL LIGHT HANDLE (MISCELLANEOUS) ×2 IMPLANT
CRADLE DONUT ADULT HEAD (MISCELLANEOUS) ×2 IMPLANT
DRAPE PED LAPAROTOMY (DRAPES) ×2 IMPLANT
DRAPE UTILITY XL STRL (DRAPES) ×2 IMPLANT
ELECT CAUTERY BLADE 6.4 (BLADE) ×2 IMPLANT
ELECT REM PT RETURN 9FT ADLT (ELECTROSURGICAL) ×2
ELECTRODE REM PT RTRN 9FT ADLT (ELECTROSURGICAL) ×1 IMPLANT
GAUZE SPONGE 2X2 8PLY STRL LF (GAUZE/BANDAGES/DRESSINGS) ×1 IMPLANT
GAUZE SPONGE 4X4 16PLY XRAY LF (GAUZE/BANDAGES/DRESSINGS) ×2 IMPLANT
GLOVE BIOGEL PI IND STRL 7.0 (GLOVE) ×1 IMPLANT
GLOVE BIOGEL PI INDICATOR 7.0 (GLOVE) ×1
GLOVE ECLIPSE 7.5 STRL STRAW (GLOVE) ×2 IMPLANT
GLOVE SURG ORTHO 8.0 STRL STRW (GLOVE) ×2 IMPLANT
GOWN STRL REUS W/ TWL LRG LVL3 (GOWN DISPOSABLE) ×1 IMPLANT
GOWN STRL REUS W/ TWL XL LVL3 (GOWN DISPOSABLE) ×1 IMPLANT
GOWN STRL REUS W/TWL LRG LVL3 (GOWN DISPOSABLE) ×1
GOWN STRL REUS W/TWL XL LVL3 (GOWN DISPOSABLE) ×1
HEMOSTAT SURGICEL 2X4 FIBR (HEMOSTASIS) ×2 IMPLANT
KIT BASIN OR (CUSTOM PROCEDURE TRAY) ×2 IMPLANT
KIT ROOM TURNOVER OR (KITS) ×2 IMPLANT
NEEDLE HYPO 25GX1X1/2 BEV (NEEDLE) ×2 IMPLANT
NS IRRIG 1000ML POUR BTL (IV SOLUTION) ×2 IMPLANT
PACK SURGICAL SETUP 50X90 (CUSTOM PROCEDURE TRAY) ×2 IMPLANT
PAD ARMBOARD 7.5X6 YLW CONV (MISCELLANEOUS) ×2 IMPLANT
PENCIL BUTTON HOLSTER BLD 10FT (ELECTRODE) ×2 IMPLANT
SPONGE GAUZE 2X2 STER 10/PKG (GAUZE/BANDAGES/DRESSINGS) ×1
SPONGE INTESTINAL PEANUT (DISPOSABLE) IMPLANT
STRIP CLOSURE SKIN 1/2X4 (GAUZE/BANDAGES/DRESSINGS) ×2 IMPLANT
SUT MNCRL AB 4-0 PS2 18 (SUTURE) ×2 IMPLANT
SUT SILK 2 0 (SUTURE)
SUT SILK 2-0 18XBRD TIE 12 (SUTURE) IMPLANT
SUT SILK 3 0 (SUTURE)
SUT SILK 3-0 18XBRD TIE 12 (SUTURE) IMPLANT
SUT VIC AB 3-0 SH 18 (SUTURE) ×2 IMPLANT
SYR BULB 3OZ (MISCELLANEOUS) ×2 IMPLANT
SYR CONTROL 10ML LL (SYRINGE) IMPLANT
TOWEL OR 17X24 6PK STRL BLUE (TOWEL DISPOSABLE) ×2 IMPLANT
TOWEL OR 17X26 10 PK STRL BLUE (TOWEL DISPOSABLE) ×2 IMPLANT
TUBE CONNECTING 12X1/4 (SUCTIONS) ×2 IMPLANT

## 2015-09-02 NOTE — Transfer of Care (Signed)
Immediate Anesthesia Transfer of Care Note  Patient: Frederick Bridges  Procedure(s) Performed: Procedure(s): PARATHYROIDECTOMY (N/A)  Patient Location: PACU  Anesthesia Type:General  Level of Consciousness: awake, alert , oriented and patient cooperative  Airway & Oxygen Therapy: Patient Spontanous Breathing  Post-op Assessment: Report given to RN, Post -op Vital signs reviewed and stable and Patient moving all extremities  Post vital signs: Reviewed and stable  Last Vitals:  Filed Vitals:   09/02/15 0633  BP: 180/85  Pulse: 85  Temp: 36.3 C  Resp: 20    Complications: No apparent anesthesia complications

## 2015-09-02 NOTE — Anesthesia Preprocedure Evaluation (Signed)
Anesthesia Evaluation  Patient identified by MRN, date of birth, ID band Patient awake    Reviewed: Allergy & Precautions, NPO status , Patient's Chart, lab work & pertinent test results  History of Anesthesia Complications Negative for: history of anesthetic complications  Airway Mallampati: II  TM Distance: >3 FB Neck ROM: Full    Dental  (+) Teeth Intact   Pulmonary neg shortness of breath, neg sleep apnea, neg COPD, neg recent URI, former smoker,    breath sounds clear to auscultation       Cardiovascular negative cardio ROS   Rhythm:Regular     Neuro/Psych negative neurological ROS  negative psych ROS   GI/Hepatic negative GI ROS, Neg liver ROS,   Endo/Other  negative endocrine ROS  Renal/GU negative Renal ROS     Musculoskeletal  (+) Arthritis , Rheumatoid disorders,    Abdominal   Peds  Hematology negative hematology ROS (+)   Anesthesia Other Findings   Reproductive/Obstetrics                             Anesthesia Physical Anesthesia Plan  ASA: II  Anesthesia Plan: General   Post-op Pain Management:    Induction: Intravenous  Airway Management Planned: Oral ETT  Additional Equipment: None  Intra-op Plan:   Post-operative Plan: Extubation in OR  Informed Consent: I have reviewed the patients History and Physical, chart, labs and discussed the procedure including the risks, benefits and alternatives for the proposed anesthesia with the patient or authorized representative who has indicated his/her understanding and acceptance.   Dental advisory given  Plan Discussed with: CRNA  Anesthesia Plan Comments:         Anesthesia Quick Evaluation

## 2015-09-02 NOTE — Op Note (Signed)
OPERATIVE REPORT - PARATHYROIDECTOMY  Preoperative diagnosis: Primary hyperparathyroidism  Postop diagnosis: Same  Procedure: Left inferior minimally invasive parathyroidectomy  Surgeon:  Earnstine Regal, MD, FACS  Anesthesia: Gen. endotracheal  Estimated blood loss: Minimal  Preparation: ChloraPrep  Indications: Patient referred by Dr. [name] for evaluation of suspected primary hyperparathyroidism. Patient was noted over the past several years to have elevated serum calcium levels ranging from 10.8-11.9. Patient had an intact PTH level checked which was in the upper range of normal at 48. 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  Procedure: Patient was prepared in the holding area. He was brought to operating room and placed in a supine position on the operating room table. Following administration of general anesthesia, the patient was positioned and then prepped and draped in the usual strict aseptic fashion. After ascertaining that an adequate level of anesthesia been achieved, a neck incision was made with a #15 blade. Dissection was carried through subcutaneous tissues and platysma. Hemostasis was obtained with the electrocautery. Skin flaps were developed circumferentially and a Weitlander retractor was placed for exposure.  Strap muscles were incised in the midline. Strap muscles were reflected exposing the thyroid lobe. With gentle blunt dissection the thyroid lobe was mobilized.  Dissection was carried through adipose tissue and an enlarged parathyroid gland was identified. It was gently mobilized. Vascular structures were divided between small and medium ligaclips. Care was taken to avoid the recurrent laryngeal nerve and the esophagus. The parathyroid gland was completely excised. It was submitted to pathology where frozen section confirmed parathyroid tissue consistent with adenoma.  Neck was irrigated with warm saline and good hemostasis was noted. Fibrillar  was placed in the operative field. Strap muscles were reapproximated in the midline with interrupted 3-0 Vicryl sutures. Platysma was closed with interrupted 3-0 Vicryl sutures. Skin was closed with a running 4-0 Monocryl subcuticular suture. Marcaine was infiltrated circumferentially. Wound was washed and dried and benzoin and Steri-Strips were applied. Sterile gauze dressings were applied. Patient was awakened from anesthesia and brought to the recovery room. The patient tolerated the procedure well.   Earnstine Regal, MD, [facility]

## 2015-09-02 NOTE — Anesthesia Procedure Notes (Signed)
Procedure Name: Intubation Date/Time: 09/02/2015 7:17 AM Performed by: Willeen Cass P Pre-anesthesia Checklist: Patient identified, Timeout performed, Emergency Drugs available, Suction available and Patient being monitored Patient Re-evaluated:Patient Re-evaluated prior to inductionOxygen Delivery Method: Circle system utilized Preoxygenation: Pre-oxygenation with 100% oxygen Intubation Type: IV induction Ventilation: Mask ventilation without difficulty and Oral airway inserted - appropriate to patient size Laryngoscope Size: Mac and 4 Grade View: Grade I Tube type: Oral Tube size: 7.5 mm Number of attempts: 1 Airway Equipment and Method: Stylet and LTA kit utilized Placement Confirmation: ETT inserted through vocal cords under direct vision,  breath sounds checked- equal and bilateral and positive ETCO2 Secured at: 23 cm Tube secured with: Tape Dental Injury: Teeth and Oropharynx as per pre-operative assessment

## 2015-09-02 NOTE — Interval H&P Note (Signed)
History and Physical Interval Note:  09/02/2015 6:45 AM  Frederick Bridges  has presented today for surgery, with the diagnosis of primary parathyroidism.  Nuclear med scan localized an adenoma to the left inferior position.  The various methods of treatment have been discussed with the patient and family. After consideration of risks, benefits and other options for treatment, the patient has consented to    Procedure(s): PARATHYROIDECTOMY (N/A) as a surgical intervention .    The patient's history has been reviewed, patient examined, no change in status, stable for surgery.  I have reviewed the patient's chart and labs.  Questions were answered to the patient's satisfaction.    Earnstine Regal, MD, [facility] Office: [place]

## 2015-09-04 NOTE — Anesthesia Postprocedure Evaluation (Signed)
Anesthesia Post Note  Patient: Frederick Bridges  Procedure(s) Performed: Procedure(s) (LRB): PARATHYROIDECTOMY (N/A)  Patient location during evaluation: PACU Anesthesia Type: General Level of consciousness: awake Pain management: pain level controlled Vital Signs Assessment: post-procedure vital signs reviewed and stable Respiratory status: spontaneous breathing Cardiovascular status: stable Postop Assessment: no signs of nausea or vomiting Anesthetic complications: no    Last Vitals:  Filed Vitals:   09/02/15 0912 09/02/15 0915  BP:    Pulse: 74 70  Temp:  36.6 C  Resp: 13 13    Last Pain:  Filed Vitals:   09/02/15 0942  PainSc: 2                  Janye Maynor

## 2015-09-06 ENCOUNTER — Encounter (HOSPITAL_COMMUNITY): Payer: Self-pay | Admitting: Surgery

## 2016-07-09 ENCOUNTER — Other Ambulatory Visit (INDEPENDENT_AMBULATORY_CARE_PROVIDER_SITE_OTHER): Payer: Self-pay | Admitting: Orthopaedic Surgery

## 2016-07-09 MED ORDER — METHOCARBAMOL 500 MG PO TABS: 500.0000 mg | ORAL_TABLET | Freq: Four times a day (QID) | ORAL | 2 refills | Status: DC | PRN

## 2016-07-09 MED ORDER — PREDNISONE 10 MG (21) PO TBPK: 10.0000 mg | ORAL_TABLET | Freq: Every day | ORAL | 0 refills | Status: DC

## 2016-07-09 MED ORDER — NAPROXEN 500 MG PO TABS: 500.0000 mg | ORAL_TABLET | Freq: Two times a day (BID) | ORAL | 3 refills | Status: DC

## 2017-02-27 DIAGNOSIS — M0579 Rheumatoid arthritis with rheumatoid factor of multiple sites without organ or systems involvement: Secondary | ICD-10-CM | POA: Diagnosis not present

## 2017-05-14 DIAGNOSIS — M5441 Lumbago with sciatica, right side: Secondary | ICD-10-CM | POA: Diagnosis not present

## 2017-05-14 DIAGNOSIS — G8929 Other chronic pain: Secondary | ICD-10-CM | POA: Diagnosis not present

## 2017-05-14 DIAGNOSIS — M5442 Lumbago with sciatica, left side: Secondary | ICD-10-CM | POA: Diagnosis not present

## 2017-06-08 ENCOUNTER — Other Ambulatory Visit (INDEPENDENT_AMBULATORY_CARE_PROVIDER_SITE_OTHER): Payer: Self-pay | Admitting: Orthopaedic Surgery

## 2017-07-10 DIAGNOSIS — M542 Cervicalgia: Secondary | ICD-10-CM | POA: Diagnosis not present

## 2017-07-10 DIAGNOSIS — M5441 Lumbago with sciatica, right side: Secondary | ICD-10-CM | POA: Diagnosis not present

## 2017-07-10 DIAGNOSIS — G8929 Other chronic pain: Secondary | ICD-10-CM | POA: Diagnosis not present

## 2017-07-10 DIAGNOSIS — M5442 Lumbago with sciatica, left side: Secondary | ICD-10-CM | POA: Diagnosis not present

## 2017-07-18 DIAGNOSIS — M0579 Rheumatoid arthritis with rheumatoid factor of multiple sites without organ or systems involvement: Secondary | ICD-10-CM | POA: Diagnosis not present

## 2017-08-08 DIAGNOSIS — M545 Low back pain: Secondary | ICD-10-CM | POA: Diagnosis not present

## 2017-08-15 DIAGNOSIS — M0579 Rheumatoid arthritis with rheumatoid factor of multiple sites without organ or systems involvement: Secondary | ICD-10-CM | POA: Diagnosis not present

## 2017-08-29 DIAGNOSIS — Z6830 Body mass index (BMI) 30.0-30.9, adult: Secondary | ICD-10-CM | POA: Diagnosis not present

## 2017-08-29 DIAGNOSIS — M0579 Rheumatoid arthritis with rheumatoid factor of multiple sites without organ or systems involvement: Secondary | ICD-10-CM | POA: Diagnosis not present

## 2017-08-29 DIAGNOSIS — M5432 Sciatica, left side: Secondary | ICD-10-CM | POA: Diagnosis not present

## 2017-08-29 DIAGNOSIS — Z79899 Other long term (current) drug therapy: Secondary | ICD-10-CM | POA: Diagnosis not present

## 2017-08-29 DIAGNOSIS — E669 Obesity, unspecified: Secondary | ICD-10-CM | POA: Diagnosis not present

## 2017-08-29 DIAGNOSIS — L409 Psoriasis, unspecified: Secondary | ICD-10-CM | POA: Diagnosis not present

## 2017-09-04 DIAGNOSIS — Z6830 Body mass index (BMI) 30.0-30.9, adult: Secondary | ICD-10-CM | POA: Diagnosis not present

## 2017-09-04 DIAGNOSIS — M5442 Lumbago with sciatica, left side: Secondary | ICD-10-CM | POA: Diagnosis not present

## 2017-09-04 DIAGNOSIS — R030 Elevated blood-pressure reading, without diagnosis of hypertension: Secondary | ICD-10-CM | POA: Diagnosis not present

## 2017-10-04 DIAGNOSIS — R030 Elevated blood-pressure reading, without diagnosis of hypertension: Secondary | ICD-10-CM | POA: Diagnosis not present

## 2017-10-04 DIAGNOSIS — M5442 Lumbago with sciatica, left side: Secondary | ICD-10-CM | POA: Diagnosis not present

## 2017-10-04 DIAGNOSIS — M542 Cervicalgia: Secondary | ICD-10-CM | POA: Diagnosis not present

## 2017-10-04 DIAGNOSIS — Z6830 Body mass index (BMI) 30.0-30.9, adult: Secondary | ICD-10-CM | POA: Diagnosis not present

## 2017-10-10 DIAGNOSIS — M0579 Rheumatoid arthritis with rheumatoid factor of multiple sites without organ or systems involvement: Secondary | ICD-10-CM | POA: Diagnosis not present

## 2017-10-14 ENCOUNTER — Other Ambulatory Visit: Payer: Self-pay

## 2017-10-14 DIAGNOSIS — D225 Melanocytic nevi of trunk: Secondary | ICD-10-CM | POA: Diagnosis not present

## 2017-10-14 DIAGNOSIS — L570 Actinic keratosis: Secondary | ICD-10-CM | POA: Diagnosis not present

## 2017-10-14 DIAGNOSIS — C44519 Basal cell carcinoma of skin of other part of trunk: Secondary | ICD-10-CM | POA: Diagnosis not present

## 2017-10-14 DIAGNOSIS — L814 Other melanin hyperpigmentation: Secondary | ICD-10-CM | POA: Diagnosis not present

## 2017-10-14 DIAGNOSIS — D485 Neoplasm of uncertain behavior of skin: Secondary | ICD-10-CM | POA: Diagnosis not present

## 2017-10-14 DIAGNOSIS — L820 Inflamed seborrheic keratosis: Secondary | ICD-10-CM | POA: Diagnosis not present

## 2017-11-01 ENCOUNTER — Other Ambulatory Visit: Payer: Self-pay

## 2017-11-01 DIAGNOSIS — C44519 Basal cell carcinoma of skin of other part of trunk: Secondary | ICD-10-CM | POA: Diagnosis not present

## 2017-11-07 DIAGNOSIS — M5442 Lumbago with sciatica, left side: Secondary | ICD-10-CM | POA: Diagnosis not present

## 2017-11-19 DIAGNOSIS — Z6830 Body mass index (BMI) 30.0-30.9, adult: Secondary | ICD-10-CM | POA: Diagnosis not present

## 2017-11-19 DIAGNOSIS — M5442 Lumbago with sciatica, left side: Secondary | ICD-10-CM | POA: Diagnosis not present

## 2017-11-19 DIAGNOSIS — R030 Elevated blood-pressure reading, without diagnosis of hypertension: Secondary | ICD-10-CM | POA: Diagnosis not present

## 2017-11-19 DIAGNOSIS — M542 Cervicalgia: Secondary | ICD-10-CM | POA: Diagnosis not present

## 2017-12-03 DIAGNOSIS — Z6830 Body mass index (BMI) 30.0-30.9, adult: Secondary | ICD-10-CM | POA: Diagnosis not present

## 2017-12-03 DIAGNOSIS — Z135 Encounter for screening for eye and ear disorders: Secondary | ICD-10-CM | POA: Diagnosis not present

## 2017-12-03 DIAGNOSIS — M48061 Spinal stenosis, lumbar region without neurogenic claudication: Secondary | ICD-10-CM | POA: Diagnosis not present

## 2017-12-03 DIAGNOSIS — R030 Elevated blood-pressure reading, without diagnosis of hypertension: Secondary | ICD-10-CM | POA: Diagnosis not present

## 2017-12-03 DIAGNOSIS — M5126 Other intervertebral disc displacement, lumbar region: Secondary | ICD-10-CM | POA: Diagnosis not present

## 2017-12-03 DIAGNOSIS — M5442 Lumbago with sciatica, left side: Secondary | ICD-10-CM | POA: Diagnosis not present

## 2017-12-05 DIAGNOSIS — M0579 Rheumatoid arthritis with rheumatoid factor of multiple sites without organ or systems involvement: Secondary | ICD-10-CM | POA: Diagnosis not present

## 2017-12-24 DIAGNOSIS — M859 Disorder of bone density and structure, unspecified: Secondary | ICD-10-CM | POA: Diagnosis not present

## 2017-12-24 DIAGNOSIS — R739 Hyperglycemia, unspecified: Secondary | ICD-10-CM | POA: Diagnosis not present

## 2017-12-24 DIAGNOSIS — E7849 Other hyperlipidemia: Secondary | ICD-10-CM | POA: Diagnosis not present

## 2017-12-24 DIAGNOSIS — Z125 Encounter for screening for malignant neoplasm of prostate: Secondary | ICD-10-CM | POA: Diagnosis not present

## 2017-12-24 DIAGNOSIS — R82998 Other abnormal findings in urine: Secondary | ICD-10-CM | POA: Diagnosis not present

## 2017-12-31 DIAGNOSIS — E7849 Other hyperlipidemia: Secondary | ICD-10-CM | POA: Diagnosis not present

## 2017-12-31 DIAGNOSIS — I10 Essential (primary) hypertension: Secondary | ICD-10-CM | POA: Diagnosis not present

## 2017-12-31 DIAGNOSIS — Z1389 Encounter for screening for other disorder: Secondary | ICD-10-CM | POA: Diagnosis not present

## 2017-12-31 DIAGNOSIS — M069 Rheumatoid arthritis, unspecified: Secondary | ICD-10-CM | POA: Diagnosis not present

## 2017-12-31 DIAGNOSIS — Z0000 Encounter for general adult medical examination without abnormal findings: Secondary | ICD-10-CM | POA: Diagnosis not present

## 2017-12-31 DIAGNOSIS — E663 Overweight: Secondary | ICD-10-CM | POA: Diagnosis not present

## 2017-12-31 DIAGNOSIS — M859 Disorder of bone density and structure, unspecified: Secondary | ICD-10-CM | POA: Diagnosis not present

## 2017-12-31 DIAGNOSIS — M5416 Radiculopathy, lumbar region: Secondary | ICD-10-CM | POA: Diagnosis not present

## 2017-12-31 DIAGNOSIS — Z1212 Encounter for screening for malignant neoplasm of rectum: Secondary | ICD-10-CM | POA: Diagnosis not present

## 2017-12-31 DIAGNOSIS — Z6831 Body mass index (BMI) 31.0-31.9, adult: Secondary | ICD-10-CM | POA: Diagnosis not present

## 2017-12-31 DIAGNOSIS — R7309 Other abnormal glucose: Secondary | ICD-10-CM | POA: Diagnosis not present

## 2018-01-29 DIAGNOSIS — L819 Disorder of pigmentation, unspecified: Secondary | ICD-10-CM | POA: Diagnosis not present

## 2018-01-29 DIAGNOSIS — Z85828 Personal history of other malignant neoplasm of skin: Secondary | ICD-10-CM | POA: Diagnosis not present

## 2018-01-30 DIAGNOSIS — M0579 Rheumatoid arthritis with rheumatoid factor of multiple sites without organ or systems involvement: Secondary | ICD-10-CM | POA: Diagnosis not present

## 2018-02-04 DIAGNOSIS — M5442 Lumbago with sciatica, left side: Secondary | ICD-10-CM | POA: Diagnosis not present

## 2018-02-24 DIAGNOSIS — M5432 Sciatica, left side: Secondary | ICD-10-CM | POA: Diagnosis not present

## 2018-02-24 DIAGNOSIS — L409 Psoriasis, unspecified: Secondary | ICD-10-CM | POA: Diagnosis not present

## 2018-02-24 DIAGNOSIS — Z6831 Body mass index (BMI) 31.0-31.9, adult: Secondary | ICD-10-CM | POA: Diagnosis not present

## 2018-02-24 DIAGNOSIS — E669 Obesity, unspecified: Secondary | ICD-10-CM | POA: Diagnosis not present

## 2018-02-24 DIAGNOSIS — Z79899 Other long term (current) drug therapy: Secondary | ICD-10-CM | POA: Diagnosis not present

## 2018-02-24 DIAGNOSIS — M0579 Rheumatoid arthritis with rheumatoid factor of multiple sites without organ or systems involvement: Secondary | ICD-10-CM | POA: Diagnosis not present

## 2018-03-11 DIAGNOSIS — R030 Elevated blood-pressure reading, without diagnosis of hypertension: Secondary | ICD-10-CM | POA: Diagnosis not present

## 2018-03-11 DIAGNOSIS — M5442 Lumbago with sciatica, left side: Secondary | ICD-10-CM | POA: Diagnosis not present

## 2018-03-11 DIAGNOSIS — Z6831 Body mass index (BMI) 31.0-31.9, adult: Secondary | ICD-10-CM | POA: Diagnosis not present

## 2018-03-27 DIAGNOSIS — Z79899 Other long term (current) drug therapy: Secondary | ICD-10-CM | POA: Diagnosis not present

## 2018-03-27 DIAGNOSIS — M0579 Rheumatoid arthritis with rheumatoid factor of multiple sites without organ or systems involvement: Secondary | ICD-10-CM | POA: Diagnosis not present

## 2018-05-22 DIAGNOSIS — M0579 Rheumatoid arthritis with rheumatoid factor of multiple sites without organ or systems involvement: Secondary | ICD-10-CM | POA: Diagnosis not present

## 2018-05-29 ENCOUNTER — Encounter (INDEPENDENT_AMBULATORY_CARE_PROVIDER_SITE_OTHER): Payer: Self-pay | Admitting: Orthopaedic Surgery

## 2018-05-29 ENCOUNTER — Ambulatory Visit (INDEPENDENT_AMBULATORY_CARE_PROVIDER_SITE_OTHER): Payer: Medicare Other | Admitting: Orthopaedic Surgery

## 2018-05-29 DIAGNOSIS — M47816 Spondylosis without myelopathy or radiculopathy, lumbar region: Secondary | ICD-10-CM | POA: Diagnosis not present

## 2018-05-29 DIAGNOSIS — Z6830 Body mass index (BMI) 30.0-30.9, adult: Secondary | ICD-10-CM | POA: Diagnosis not present

## 2018-05-29 DIAGNOSIS — L409 Psoriasis, unspecified: Secondary | ICD-10-CM | POA: Diagnosis not present

## 2018-05-29 DIAGNOSIS — M5432 Sciatica, left side: Secondary | ICD-10-CM | POA: Diagnosis not present

## 2018-05-29 DIAGNOSIS — E669 Obesity, unspecified: Secondary | ICD-10-CM | POA: Diagnosis not present

## 2018-05-29 DIAGNOSIS — M65341 Trigger finger, right ring finger: Secondary | ICD-10-CM

## 2018-05-29 DIAGNOSIS — Z79899 Other long term (current) drug therapy: Secondary | ICD-10-CM | POA: Diagnosis not present

## 2018-05-29 DIAGNOSIS — M0579 Rheumatoid arthritis with rheumatoid factor of multiple sites without organ or systems involvement: Secondary | ICD-10-CM | POA: Diagnosis not present

## 2018-05-29 DIAGNOSIS — M2550 Pain in unspecified joint: Secondary | ICD-10-CM | POA: Diagnosis not present

## 2018-05-29 MED ORDER — LIDOCAINE HCL 1 % IJ SOLN
0.3000 mL | INTRAMUSCULAR | Status: AC | PRN
Start: 2018-05-29 — End: 2018-05-29
  Administered 2018-05-29: .3 mL

## 2018-05-29 MED ORDER — METHYLPREDNISOLONE ACETATE 40 MG/ML IJ SUSP
13.3300 mg | INTRAMUSCULAR | Status: AC | PRN
  Administered 2018-05-29: 13.33 mg

## 2018-05-29 MED ORDER — BUPIVACAINE HCL 0.5 % IJ SOLN
0.3300 mL | INTRAMUSCULAR | Status: AC | PRN
  Administered 2018-05-29: .33 mL

## 2018-05-29 NOTE — Progress Notes (Signed)
Office Visit Note   Patient: Frederick Bridges           Date of Birth: Feb 25, 1951           MRN: [id number] Visit Date: 05/29/2018              Requested by: [name], MD [address] [place], [place] [address] PCP: [name], MD   Assessment & Plan: Visit Diagnoses:  1. Trigger finger, right ring finger     Plan: Impression is right ring trigger finger.  After discussion of treatment options we agreed to proceed with repeating the cortisone injection.  He understands that if this recurs then likely be looking at surgery.  Risks and benefits and rehab and recovery were discussed.  He is on antibiotic for his rheumatoid arthritis for which he will have to be off of for at least 2 weeks before and 2 weeks after surgery.  Follow-Up Instructions: Return if symptoms worsen or fail to improve.   Orders:  No orders of the defined types were placed in this encounter.  No orders of the defined types were placed in this encounter.     Procedures: Hand/UE Inj: R ring A1 for trigger finger on 05/29/2018 8:46 AM Indications: pain Details: 25 G needle Medications: 0.3 mL lidocaine 1 %; 0.33 mL bupivacaine 0.5 %; 13.33 mg methylPREDNISolone acetate 40 MG/ML Outcome: tolerated well, no immediate complications Consent was given by the patient. Patient was prepped and draped in the usual sterile fashion.       Clinical Data: No additional findings.   Subjective: Chief Complaint  Patient presents with  . Right Ring Finger - Pain    Frederick Bridges comes in today for recurrent right ring trigger finger.  I previously injected his finger about 9 months ago.  He recently had an episode in which his finger locked up.  He had to forcefully extend it.  Denies any changes in medical history.   Review of Systems   Objective: Vital Signs: There were no vitals taken for this visit.  Physical Exam  Ortho Exam Right ring finger shows painful A1 pulley with flexion and  extension. Specialty Comments:  No specialty comments available.  Imaging: No results found.   PMFS History: Patient Active Problem List   Diagnosis Date Noted  . Hyperparathyroidism, primary ([place]) 08/30/2015  . Personal history of colonic adenomas 12/22/2012   Past Medical History:  Diagnosis Date  . Colon polyps   . Hay fever   . Nocturia   . Personal history of colonic adenomas 12/22/2012  . Rheumatoid arthritis ([place])   . Wears glasses     Family History  Problem Relation Age of Onset  . Colon cancer Neg Hx   . Pancreatic cancer Neg Hx   . Rectal cancer Neg Hx   . Stomach cancer Neg Hx   . Lung cancer Mother   . Lung cancer Brother     Past Surgical History:  Procedure Laterality Date  . COLONOSCOPY  multiple  . INGUINAL HERNIA REPAIR    . PARATHYROIDECTOMY N/A 09/02/2015   Procedure: PARATHYROIDECTOMY;  Surgeon: Armandina Gemma, MD;  Location: [place];  Service: General;  Laterality: N/A;  . TONSILLECTOMY     as a child   Social History   Occupational History  . Not on file  Tobacco Use  . Smoking status: Former Smoker    Last attempt to quit: 07/10/2008    Years since quitting: 9.8  . Smokeless tobacco: Never  Used  Substance and Sexual Activity  . Alcohol use: Yes    Alcohol/week: 12.0 standard drinks    Types: 12 Cans of beer per week    Comment: 12 cans per week  . Drug use: No  . Sexual activity: Not on file

## 2018-07-17 DIAGNOSIS — M0579 Rheumatoid arthritis with rheumatoid factor of multiple sites without organ or systems involvement: Secondary | ICD-10-CM | POA: Diagnosis not present

## 2018-08-01 DIAGNOSIS — L905 Scar conditions and fibrosis of skin: Secondary | ICD-10-CM | POA: Diagnosis not present

## 2018-08-01 DIAGNOSIS — D485 Neoplasm of uncertain behavior of skin: Secondary | ICD-10-CM | POA: Diagnosis not present

## 2018-08-01 DIAGNOSIS — Z85828 Personal history of other malignant neoplasm of skin: Secondary | ICD-10-CM | POA: Diagnosis not present

## 2018-08-28 DIAGNOSIS — Z79899 Other long term (current) drug therapy: Secondary | ICD-10-CM | POA: Diagnosis not present

## 2018-08-28 DIAGNOSIS — E669 Obesity, unspecified: Secondary | ICD-10-CM | POA: Diagnosis not present

## 2018-08-28 DIAGNOSIS — L409 Psoriasis, unspecified: Secondary | ICD-10-CM | POA: Diagnosis not present

## 2018-08-28 DIAGNOSIS — M0579 Rheumatoid arthritis with rheumatoid factor of multiple sites without organ or systems involvement: Secondary | ICD-10-CM | POA: Diagnosis not present

## 2018-08-28 DIAGNOSIS — M47816 Spondylosis without myelopathy or radiculopathy, lumbar region: Secondary | ICD-10-CM | POA: Diagnosis not present

## 2018-08-28 DIAGNOSIS — Z6832 Body mass index (BMI) 32.0-32.9, adult: Secondary | ICD-10-CM | POA: Diagnosis not present

## 2018-08-28 DIAGNOSIS — M5432 Sciatica, left side: Secondary | ICD-10-CM | POA: Diagnosis not present

## 2018-09-04 DIAGNOSIS — J020 Streptococcal pharyngitis: Secondary | ICD-10-CM | POA: Diagnosis not present

## 2018-09-04 DIAGNOSIS — I10 Essential (primary) hypertension: Secondary | ICD-10-CM | POA: Diagnosis not present

## 2018-09-04 DIAGNOSIS — E669 Obesity, unspecified: Secondary | ICD-10-CM | POA: Diagnosis not present

## 2018-09-04 DIAGNOSIS — Z6831 Body mass index (BMI) 31.0-31.9, adult: Secondary | ICD-10-CM | POA: Diagnosis not present

## 2018-09-11 DIAGNOSIS — M0579 Rheumatoid arthritis with rheumatoid factor of multiple sites without organ or systems involvement: Secondary | ICD-10-CM | POA: Diagnosis not present

## 2018-10-20 DIAGNOSIS — R05 Cough: Secondary | ICD-10-CM | POA: Diagnosis not present

## 2018-10-20 DIAGNOSIS — Z6833 Body mass index (BMI) 33.0-33.9, adult: Secondary | ICD-10-CM | POA: Diagnosis not present

## 2018-10-20 DIAGNOSIS — J181 Lobar pneumonia, unspecified organism: Secondary | ICD-10-CM | POA: Diagnosis not present

## 2018-11-06 DIAGNOSIS — Z79899 Other long term (current) drug therapy: Secondary | ICD-10-CM | POA: Diagnosis not present

## 2018-11-06 DIAGNOSIS — M0579 Rheumatoid arthritis with rheumatoid factor of multiple sites without organ or systems involvement: Secondary | ICD-10-CM | POA: Diagnosis not present

## 2018-12-29 DIAGNOSIS — R739 Hyperglycemia, unspecified: Secondary | ICD-10-CM | POA: Diagnosis not present

## 2018-12-29 DIAGNOSIS — M859 Disorder of bone density and structure, unspecified: Secondary | ICD-10-CM | POA: Diagnosis not present

## 2018-12-29 DIAGNOSIS — I10 Essential (primary) hypertension: Secondary | ICD-10-CM | POA: Diagnosis not present

## 2018-12-29 DIAGNOSIS — Z125 Encounter for screening for malignant neoplasm of prostate: Secondary | ICD-10-CM | POA: Diagnosis not present

## 2018-12-29 DIAGNOSIS — E7849 Other hyperlipidemia: Secondary | ICD-10-CM | POA: Diagnosis not present

## 2018-12-29 DIAGNOSIS — R82998 Other abnormal findings in urine: Secondary | ICD-10-CM | POA: Diagnosis not present

## 2019-01-01 DIAGNOSIS — M0579 Rheumatoid arthritis with rheumatoid factor of multiple sites without organ or systems involvement: Secondary | ICD-10-CM | POA: Diagnosis not present

## 2019-01-05 DIAGNOSIS — M069 Rheumatoid arthritis, unspecified: Secondary | ICD-10-CM | POA: Diagnosis not present

## 2019-01-05 DIAGNOSIS — R739 Hyperglycemia, unspecified: Secondary | ICD-10-CM | POA: Diagnosis not present

## 2019-01-05 DIAGNOSIS — Z1331 Encounter for screening for depression: Secondary | ICD-10-CM | POA: Diagnosis not present

## 2019-01-05 DIAGNOSIS — M8580 Other specified disorders of bone density and structure, unspecified site: Secondary | ICD-10-CM | POA: Diagnosis not present

## 2019-01-05 DIAGNOSIS — J181 Lobar pneumonia, unspecified organism: Secondary | ICD-10-CM | POA: Diagnosis not present

## 2019-01-05 DIAGNOSIS — E785 Hyperlipidemia, unspecified: Secondary | ICD-10-CM | POA: Diagnosis not present

## 2019-01-05 DIAGNOSIS — Z1339 Encounter for screening examination for other mental health and behavioral disorders: Secondary | ICD-10-CM | POA: Diagnosis not present

## 2019-01-05 DIAGNOSIS — M5416 Radiculopathy, lumbar region: Secondary | ICD-10-CM | POA: Diagnosis not present

## 2019-01-05 DIAGNOSIS — N529 Male erectile dysfunction, unspecified: Secondary | ICD-10-CM | POA: Diagnosis not present

## 2019-01-05 DIAGNOSIS — I10 Essential (primary) hypertension: Secondary | ICD-10-CM | POA: Diagnosis not present

## 2019-01-05 DIAGNOSIS — Z0000 Encounter for general adult medical examination without abnormal findings: Secondary | ICD-10-CM | POA: Diagnosis not present

## 2019-01-05 DIAGNOSIS — E669 Obesity, unspecified: Secondary | ICD-10-CM | POA: Diagnosis not present

## 2019-02-11 DIAGNOSIS — H04123 Dry eye syndrome of bilateral lacrimal glands: Secondary | ICD-10-CM | POA: Diagnosis not present

## 2019-02-11 DIAGNOSIS — H02834 Dermatochalasis of left upper eyelid: Secondary | ICD-10-CM | POA: Diagnosis not present

## 2019-02-11 DIAGNOSIS — H02831 Dermatochalasis of right upper eyelid: Secondary | ICD-10-CM | POA: Diagnosis not present

## 2019-02-11 DIAGNOSIS — H2513 Age-related nuclear cataract, bilateral: Secondary | ICD-10-CM | POA: Diagnosis not present

## 2019-02-26 DIAGNOSIS — M0579 Rheumatoid arthritis with rheumatoid factor of multiple sites without organ or systems involvement: Secondary | ICD-10-CM | POA: Diagnosis not present

## 2019-03-02 DIAGNOSIS — L409 Psoriasis, unspecified: Secondary | ICD-10-CM | POA: Diagnosis not present

## 2019-03-02 DIAGNOSIS — E669 Obesity, unspecified: Secondary | ICD-10-CM | POA: Diagnosis not present

## 2019-03-02 DIAGNOSIS — Z6831 Body mass index (BMI) 31.0-31.9, adult: Secondary | ICD-10-CM | POA: Diagnosis not present

## 2019-03-02 DIAGNOSIS — M2550 Pain in unspecified joint: Secondary | ICD-10-CM | POA: Diagnosis not present

## 2019-03-02 DIAGNOSIS — M0579 Rheumatoid arthritis with rheumatoid factor of multiple sites without organ or systems involvement: Secondary | ICD-10-CM | POA: Diagnosis not present

## 2019-03-02 DIAGNOSIS — M5432 Sciatica, left side: Secondary | ICD-10-CM | POA: Diagnosis not present

## 2019-03-02 DIAGNOSIS — M47816 Spondylosis without myelopathy or radiculopathy, lumbar region: Secondary | ICD-10-CM | POA: Diagnosis not present

## 2019-03-17 ENCOUNTER — Encounter: Payer: Self-pay | Admitting: Orthopaedic Surgery

## 2019-03-17 ENCOUNTER — Ambulatory Visit (INDEPENDENT_AMBULATORY_CARE_PROVIDER_SITE_OTHER): Payer: Medicare Other | Admitting: Orthopaedic Surgery

## 2019-03-17 ENCOUNTER — Other Ambulatory Visit: Payer: Self-pay

## 2019-03-17 DIAGNOSIS — M65341 Trigger finger, right ring finger: Secondary | ICD-10-CM | POA: Diagnosis not present

## 2019-03-17 MED ORDER — BUPIVACAINE HCL 0.5 % IJ SOLN
0.3300 mL | INTRAMUSCULAR | Status: AC | PRN
  Administered 2019-03-17: .33 mL

## 2019-03-17 MED ORDER — LIDOCAINE HCL 1 % IJ SOLN
0.3000 mL | INTRAMUSCULAR | Status: AC | PRN
  Administered 2019-03-17: .3 mL

## 2019-03-17 MED ORDER — METHYLPREDNISOLONE ACETATE 40 MG/ML IJ SUSP
13.3300 mg | INTRAMUSCULAR | Status: AC | PRN
  Administered 2019-03-17: 13.33 mg

## 2019-03-17 NOTE — Progress Notes (Signed)
Office Visit Note   Patient: Frederick Bridges           Date of Birth: [birth year]           MRN: [id number] Visit Date: 03/17/2019              Requested by: [name], MD [place],  [place] [address] PCP: [name], MD   Assessment & Plan: Visit Diagnoses: No diagnosis found.  Plan: Impression is recurrent right ring trigger finger.  He has had 2 injections with temporary relief of about 9 to 10 months each.  He is interested in having this surgically released in the fall or winter time therefore we provide him with 1 more injection to get him through the summer.  He tolerated the injection well today.  He will contact us when he is ready to schedule surgery.  He understands that he will have to stop his anti-Biologics for his rheumatoid arthritis at least 2 weeks before and after surgery.  Risks and benefits and alternatives and rehab and recovery were discussed.  We will see him back as needed.  Follow-Up Instructions: Return if symptoms worsen or fail to improve.   Orders:  No orders of the defined types were placed in this encounter.  No orders of the defined types were placed in this encounter.     Procedures: Hand/UE Inj: R ring A1 for trigger finger on 03/17/2019 10:12 AM Indications: pain Details: 25 G needle Medications: 0.3 mL lidocaine 1 %; 0.33 mL bupivacaine 0.5 %; 13.33 mg methylPREDNISolone acetate 40 MG/ML Outcome: tolerated well, no immediate complications Consent was given by the patient. Patient was prepped and draped in the usual sterile fashion.       Clinical Data: No additional findings.   Subjective: Chief Complaint  Patient presents with  . Right Hand - Pain    Thi comes in today for reevaluation of a right ring trigger finger.  He is previous injection was about 9 to 10 months ago.  He had really good relief with this injection but recently it has started to wear off and he is starting to experience increased pain  and triggering and difficulty making a fist.  He does have rheumatoid arthritis.   Review of Systems   Objective: Vital Signs: There were no vitals taken for this visit.  Physical Exam  Ortho Exam Right hand exam is unchanged.  He has tenderness at the A1 pulley in line with the ring finger. Specialty Comments:  No specialty comments available.  Imaging: No results found.   PMFS History: Patient Active Problem List   Diagnosis Date Noted  . Hyperparathyroidism, primary ([place]) 08/30/2015  . Personal history of colonic adenomas 12/22/2012   Past Medical History:  Diagnosis Date  . Colon polyps   . Hay fever   . Nocturia   . Personal history of colonic adenomas 12/22/2012  . Rheumatoid arthritis ([place])   . Wears glasses     Family History  Problem Relation Age of Onset  . Colon cancer Neg Hx   . Pancreatic cancer Neg Hx   . Rectal cancer Neg Hx   . Stomach cancer Neg Hx   . Lung cancer Mother   . Lung cancer Brother     Past Surgical History:  Procedure Laterality Date  . COLONOSCOPY  multiple  . INGUINAL HERNIA REPAIR    . PARATHYROIDECTOMY N/A 09/02/2015   Procedure: PARATHYROIDECTOMY;  Surgeon: Armandina Gemma, MD;  Location: [facility];  Service: General;  Laterality: N/A;  . TONSILLECTOMY     as a child   Social History   Occupational History  . Not on file  Tobacco Use  . Smoking status: Former Smoker    Quit date: 07/10/2008    Years since quitting: 10.6  . Smokeless tobacco: Never Used  Substance and Sexual Activity  . Alcohol use: Yes    Alcohol/week: 12.0 standard drinks    Types: 12 Cans of beer per week    Comment: 12 cans per week  . Drug use: No  . Sexual activity: Not on file

## 2019-04-06 DIAGNOSIS — I714 Abdominal aortic aneurysm, without rupture, unspecified: Secondary | ICD-10-CM | POA: Insufficient documentation

## 2019-04-06 DIAGNOSIS — M5136 Other intervertebral disc degeneration, lumbar region: Secondary | ICD-10-CM | POA: Diagnosis not present

## 2019-04-16 DIAGNOSIS — D1801 Hemangioma of skin and subcutaneous tissue: Secondary | ICD-10-CM | POA: Diagnosis not present

## 2019-04-16 DIAGNOSIS — B078 Other viral warts: Secondary | ICD-10-CM | POA: Diagnosis not present

## 2019-04-16 DIAGNOSIS — L814 Other melanin hyperpigmentation: Secondary | ICD-10-CM | POA: Diagnosis not present

## 2019-04-16 DIAGNOSIS — Z85828 Personal history of other malignant neoplasm of skin: Secondary | ICD-10-CM | POA: Diagnosis not present

## 2019-04-16 DIAGNOSIS — I788 Other diseases of capillaries: Secondary | ICD-10-CM | POA: Diagnosis not present

## 2019-04-16 DIAGNOSIS — L821 Other seborrheic keratosis: Secondary | ICD-10-CM | POA: Diagnosis not present

## 2019-04-23 DIAGNOSIS — M5136 Other intervertebral disc degeneration, lumbar region: Secondary | ICD-10-CM | POA: Diagnosis not present

## 2019-04-23 DIAGNOSIS — M0579 Rheumatoid arthritis with rheumatoid factor of multiple sites without organ or systems involvement: Secondary | ICD-10-CM | POA: Diagnosis not present

## 2019-04-27 DIAGNOSIS — R319 Hematuria, unspecified: Secondary | ICD-10-CM | POA: Diagnosis not present

## 2019-04-30 DIAGNOSIS — R3121 Asymptomatic microscopic hematuria: Secondary | ICD-10-CM | POA: Diagnosis not present

## 2019-04-30 DIAGNOSIS — R310 Gross hematuria: Secondary | ICD-10-CM | POA: Diagnosis not present

## 2019-05-06 DIAGNOSIS — N200 Calculus of kidney: Secondary | ICD-10-CM | POA: Diagnosis not present

## 2019-05-06 DIAGNOSIS — R310 Gross hematuria: Secondary | ICD-10-CM | POA: Diagnosis not present

## 2019-05-12 DIAGNOSIS — J181 Lobar pneumonia, unspecified organism: Secondary | ICD-10-CM | POA: Diagnosis not present

## 2019-05-12 DIAGNOSIS — N3000 Acute cystitis without hematuria: Secondary | ICD-10-CM | POA: Diagnosis not present

## 2019-05-12 DIAGNOSIS — R3121 Asymptomatic microscopic hematuria: Secondary | ICD-10-CM | POA: Diagnosis not present

## 2019-05-12 DIAGNOSIS — M5416 Radiculopathy, lumbar region: Secondary | ICD-10-CM | POA: Diagnosis not present

## 2019-05-12 DIAGNOSIS — I10 Essential (primary) hypertension: Secondary | ICD-10-CM | POA: Diagnosis not present

## 2019-06-10 ENCOUNTER — Other Ambulatory Visit: Payer: Self-pay

## 2019-06-10 DIAGNOSIS — I714 Abdominal aortic aneurysm, without rupture, unspecified: Secondary | ICD-10-CM

## 2019-06-11 ENCOUNTER — Telehealth (HOSPITAL_COMMUNITY): Payer: Self-pay | Admitting: *Deleted

## 2019-06-11 ENCOUNTER — Ambulatory Visit (HOSPITAL_COMMUNITY): Payer: Medicare Other

## 2019-06-11 ENCOUNTER — Encounter: Payer: Medicare Other | Admitting: Vascular Surgery

## 2019-06-11 NOTE — Telephone Encounter (Signed)
The above patient or their representative was contacted and gave the following answers to these questions:         Do you have any of the following symptoms?n  Fever                    Cough                   Shortness of breath  Do  you have any of the following other symptoms? n   muscle pain         vomiting,        diarrhea        rash         weakness        red eye        abdominal pain         bruising          bruising or bleeding              joint pain           severe headache    Have you been in contact with someone who was or has been sick in the past 2 weeks?n  Yes                 Unsure                         Unable to assess   Does the person that you were in contact with have any of the following symptoms?   Cough         shortness of breath           muscle pain         vomiting,            diarrhea            rash            weakness           fever            red eye           abdominal pain           bruising  or  bleeding                joint pain                severe headache               Have you  or someone you have been in contact with traveled internationally in th last month?         If yes, which countries?   Have you  or someone you have been in contact with traveled outside [place] in th last month?         If yes, which state and city?   COMMENTS OR ACTION PLAN FOR THIS PATIENT:         [place]  

## 2019-06-12 ENCOUNTER — Other Ambulatory Visit: Payer: Self-pay

## 2019-06-12 ENCOUNTER — Ambulatory Visit (INDEPENDENT_AMBULATORY_CARE_PROVIDER_SITE_OTHER): Payer: Medicare Other | Admitting: Vascular Surgery

## 2019-06-12 ENCOUNTER — Ambulatory Visit (HOSPITAL_COMMUNITY)
Admission: RE | Admit: 2019-06-12 | Discharge: 2019-06-12 | Disposition: A | Discharge status: Home / Self Care | Payer: Medicare Other | Source: Ambulatory Visit | Attending: Family | Admitting: Family

## 2019-06-12 ENCOUNTER — Encounter: Payer: Self-pay | Admitting: Vascular Surgery

## 2019-06-12 VITALS — BP 151/95 | HR 65 | Temp 97.2°F | Resp 20 | Ht 70.0 in | Wt 212.0 lb

## 2019-06-12 DIAGNOSIS — I714 Abdominal aortic aneurysm, without rupture, unspecified: Secondary | ICD-10-CM

## 2019-06-12 NOTE — Progress Notes (Signed)
Patient ID: Frederick Bridges, male   DOB: April 14, 1951, 68 y.o.   MRN: [id number]  Reason for Consult: New Patient (Initial Visit)   Referred by [name], MD  Subjective:     HPI:  Frederick Bridges is a 68 y.o. male sent for evaluation abdominal aortic aneurysm.  He has no new back or abdominal pain.  This was seen on screening exam.  He does not have any family members with abdominal aneurysm.  He is able to walk without limitation.  Denies any history of stroke TIA amaurosis.  Past Medical History:  Diagnosis Date  . AAA (abdominal aortic aneurysm) ([place])   . Colon polyps   . Hay fever   . Nocturia   . Personal history of colonic adenomas 12/22/2012  . Rheumatoid arthritis ([place])   . Wears glasses    Family History  Problem Relation Age of Onset  . Lung cancer Mother   . Lung cancer Brother   . Colon cancer Neg Hx   . Pancreatic cancer Neg Hx   . Rectal cancer Neg Hx   . Stomach cancer Neg Hx    Past Surgical History:  Procedure Laterality Date  . COLONOSCOPY  multiple  . INGUINAL HERNIA REPAIR    . PARATHYROIDECTOMY N/A 09/02/2015   Procedure: PARATHYROIDECTOMY;  Surgeon: Armandina Gemma, MD;  Location: [facility];  Service: General;  Laterality: N/A;  . TONSILLECTOMY     as a child    Short Social History:  Social History   Tobacco Use  . Smoking status: Former Smoker    Quit date: 07/10/2008    Years since quitting: 10.9  . Smokeless tobacco: Never Used  Substance Use Topics  . Alcohol use: Yes    Alcohol/week: 12.0 standard drinks    Types: 12 Cans of beer per week    Comment: 12 cans per week    No Known Allergies  Current Outpatient Medications  Medication Sig Dispense Refill  . golimumab (SIMPONI ARIA) 50 MG/4ML SOLN injection Simponi ARIA 12.5 mg/mL intravenous solution    . naproxen sodium (ALEVE) 220 MG tablet Take 220 mg by mouth.     No current facility-administered medications for this visit.     Review of Systems  Constitutional:   Constitutional negative. HENT: HENT negative.  Eyes: Eyes negative.  Respiratory: Respiratory negative.  Cardiovascular: Cardiovascular negative.  GI: Gastrointestinal negative.  GU: Positive for hematuria.  Skin: Skin negative.  Neurological: Neurological negative. Hematologic: Hematologic/lymphatic negative.  Psychiatric: Psychiatric negative.        Objective:  Objective   Vitals:   06/12/19 0945  BP: (!) 151/95  Pulse: 65  Resp: 20  Temp: (!) 97.2 F (36.2 C)  SpO2: 98%  Weight: 212 lb (96.2 kg)  Height: 5\' 10"  (1.778 m)   Body mass index is 30.42 kg/m.  Physical Exam Constitutional:      Appearance: Normal appearance.  HENT:     Head: Normocephalic.     Nose: Nose normal.     Mouth/Throat:     Mouth: Mucous membranes are moist.  Eyes:     Pupils: Pupils are equal, round, and reactive to light.  Neck:     Musculoskeletal: Normal range of motion and neck supple.     Vascular: No carotid bruit.  Cardiovascular:     Rate and Rhythm: Normal rate.     Pulses: Normal pulses.  Abdominal:     General: Abdomen is flat.     Palpations: Abdomen is  soft. There is no mass.  Musculoskeletal: Normal range of motion.  Skin:    General: Skin is warm and dry.     Capillary Refill: Capillary refill takes less than 2 seconds.  Neurological:     Mental Status: He is alert.  Psychiatric:        Mood and Affect: Mood normal.        Behavior: Behavior normal.        Thought Content: Thought content normal.        Judgment: Judgment normal.     Data: I have independently interpreted his aortic duplex which demonstrates largest diameter 2.97 cm.  This correlates with outside CT scan which was 3 cm in the distal aorta.     Assessment/Plan:     68 year old male with 3 cm distal aortic aneurysm.  Given the small size we will see him back in 2 years.  I discussed the signs and symptoms of rupture as well as expected course of persistent growth although is mainly reached  the size for repair.  He demonstrates good understanding we will get him followed up with repeat aortic duplex in 2 years.     Waynetta Sandy MD [facility]

## 2019-06-19 DIAGNOSIS — R799 Abnormal finding of blood chemistry, unspecified: Secondary | ICD-10-CM | POA: Diagnosis not present

## 2019-06-19 DIAGNOSIS — M0579 Rheumatoid arthritis with rheumatoid factor of multiple sites without organ or systems involvement: Secondary | ICD-10-CM | POA: Diagnosis not present

## 2019-06-26 ENCOUNTER — Encounter: Payer: Self-pay | Admitting: Internal Medicine

## 2019-08-14 DIAGNOSIS — M0579 Rheumatoid arthritis with rheumatoid factor of multiple sites without organ or systems involvement: Secondary | ICD-10-CM | POA: Diagnosis not present

## 2019-08-15 DIAGNOSIS — M5136 Other intervertebral disc degeneration, lumbar region: Secondary | ICD-10-CM | POA: Diagnosis not present

## 2019-09-02 ENCOUNTER — Ambulatory Visit (AMBULATORY_SURGERY_CENTER): Payer: Medicare Other

## 2019-09-02 ENCOUNTER — Other Ambulatory Visit: Payer: Self-pay

## 2019-09-02 VITALS — Temp 97.7°F | Ht 70.0 in | Wt 215.4 lb

## 2019-09-02 DIAGNOSIS — Z86010 Personal history of colonic polyps: Secondary | ICD-10-CM

## 2019-09-02 DIAGNOSIS — Z1159 Encounter for screening for other viral diseases: Secondary | ICD-10-CM

## 2019-09-02 NOTE — Progress Notes (Signed)

## 2019-09-14 ENCOUNTER — Ambulatory Visit (INDEPENDENT_AMBULATORY_CARE_PROVIDER_SITE_OTHER): Payer: Medicare Other

## 2019-09-14 ENCOUNTER — Other Ambulatory Visit: Payer: Self-pay | Admitting: Internal Medicine

## 2019-09-14 DIAGNOSIS — Z1159 Encounter for screening for other viral diseases: Secondary | ICD-10-CM

## 2019-09-15 LAB — SARS CORONAVIRUS 2 (TAT 6-24 HRS): SARS Coronavirus 2: NEGATIVE

## 2019-09-17 ENCOUNTER — Other Ambulatory Visit: Payer: Self-pay | Admitting: Internal Medicine

## 2019-09-17 ENCOUNTER — Other Ambulatory Visit: Payer: Self-pay

## 2019-09-17 ENCOUNTER — Ambulatory Visit (AMBULATORY_SURGERY_CENTER): Payer: Medicare Other | Admitting: Internal Medicine

## 2019-09-17 ENCOUNTER — Encounter: Payer: Self-pay | Admitting: Internal Medicine

## 2019-09-17 VITALS — BP 112/79 | HR 73 | Temp 97.6°F | Resp 12 | Ht 70.0 in | Wt 215.0 lb

## 2019-09-17 DIAGNOSIS — Z86010 Personal history of colonic polyps: Secondary | ICD-10-CM | POA: Diagnosis not present

## 2019-09-17 DIAGNOSIS — M069 Rheumatoid arthritis, unspecified: Secondary | ICD-10-CM | POA: Diagnosis not present

## 2019-09-17 MED ORDER — SODIUM CHLORIDE 0.9 % IV SOLN: 500.0000 mL | Freq: Once | INTRAVENOUS | Status: DC

## 2019-09-17 NOTE — Patient Instructions (Addendum)
No polyps today!  Your next routine colonoscopy should be in 5 years - 2026.  You do not need to test stool for blood if offered by Dr. Philip Aspen since you have regular colonoscopy testing.  I appreciate the opportunity to care for you. Gatha Mayer, MD, FACG   YOU HAD AN ENDOSCOPIC PROCEDURE TODAY AT [facility]:   Refer to the procedure report that was given to you for any specific questions about what was found during the examination.  If the procedure report does not answer your questions, please call your gastroenterologist to clarify.  If you requested that your care partner not be given the details of your procedure findings, then the procedure report has been included in a sealed envelope for you to review at your convenience later.  YOU SHOULD EXPECT: Some feelings of bloating in the abdomen. Passage of more gas than usual.  Walking can help get rid of the air that was put into your GI tract during the procedure and reduce the bloating. If you had a lower endoscopy (such as a colonoscopy or flexible sigmoidoscopy) you may notice spotting of blood in your stool or on the toilet paper. If you underwent a bowel prep for your procedure, you may not have a normal bowel movement for a few days.  Please Note:  You might notice some irritation and congestion in your nose or some drainage.  This is from the oxygen used during your procedure.  There is no need for concern and it should clear up in a day or so.  SYMPTOMS TO REPORT IMMEDIATELY:   Following lower endoscopy (colonoscopy or flexible sigmoidoscopy):  Excessive amounts of blood in the stool  Significant tenderness or worsening of abdominal pains  Swelling of the abdomen that is new, acute  Fever of 100F or higher   For urgent or emergent issues, a gastroenterologist can be reached at any hour by calling [phone].   DIET:  We do recommend a small meal at first, but then you may proceed to your regular  diet.  Drink plenty of fluids but you should avoid alcoholic beverages for 24 hours.  ACTIVITY:  You should plan to take it easy for the rest of today and you should NOT DRIVE or use heavy machinery until tomorrow (because of the sedation medicines used during the test).    FOLLOW UP: Our staff will call the number listed on your records 48-72 hours following your procedure to check on you and address any questions or concerns that you may have regarding the information given to you following your procedure. If we do not reach you, we will leave a message.  We will attempt to reach you two times.  During this call, we will ask if you have developed any symptoms of COVID 19. If you develop any symptoms (ie: fever, flu-like symptoms, shortness of breath, cough etc.) before then, please call [phone].  If you test positive for Covid 19 in the 2 weeks post procedure, please call and report this information to [place].    If any biopsies were taken you will be contacted by phone or by letter within the next 1-3 weeks.  Please call us at [phone] if you have not heard about the biopsies in 3 weeks.    SIGNATURES/CONFIDENTIALITY: You and/or your care partner have signed paperwork which will be entered into your electronic medical record.  These signatures attest to the fact that that the information above on your  After Visit Summary has been reviewed and is understood.  Full responsibility of the confidentiality of this discharge information lies with you and/or your care-partner.

## 2019-09-17 NOTE — Op Note (Signed)
[facility] Patient Name: Frederick Bridges Procedure Date: 09/17/2019 10:02 AM MRN: [id number] Endoscopist: Gatha Mayer , MD Age: 69 Referring MD:  Date of Birth: 1950/11/18 Gender: Male Account #: [id number] Procedure:                Colonoscopy Indications:              Surveillance: Personal history of adenomatous                            polyps on last colonoscopy 5 years ago Medicines:                Propofol per Anesthesia, Monitored Anesthesia Care Procedure:                Pre-Anesthesia Assessment:                           - Prior to the procedure, a History and Physical                            was performed, and patient medications and                            allergies were reviewed. The patient's tolerance of                            previous anesthesia was also reviewed. The risks                            and benefits of the procedure and the sedation                            options and risks were discussed with the patient.                            All questions were answered, and informed consent                            was obtained. Prior Anticoagulants: The patient has                            taken no previous anticoagulant or antiplatelet                            agents. ASA Grade Assessment: II - A patient with                            mild systemic disease. After reviewing the risks                            and benefits, the patient was deemed in                            satisfactory condition to undergo the procedure.  After obtaining informed consent, the colonoscope                            was passed under direct vision. Throughout the                            procedure, the patient's blood pressure, pulse, and                            oxygen saturations were monitored continuously. The                            Colonoscope was introduced through the anus and                             advanced to the the cecum, identified by                            appendiceal orifice and ileocecal valve. The                            colonoscopy was performed without difficulty. The                            patient tolerated the procedure well. The quality                            of the bowel preparation was good. The bowel                            preparation used was Miralax via split dose                            instruction. The ileocecal valve, appendiceal                            orifice, and rectum were photographed. Scope In: 10:04:16 AM Scope Out: 10:16:13 AM Scope Withdrawal Time: 0 hours 8 minutes 35 seconds  Total Procedure Duration: 0 hours 11 minutes 57 seconds  Findings:                 The perianal and digital rectal examinations were                            normal. Pertinent negatives include normal prostate                            (size, shape, and consistency).                           The entire examined colon appeared normal on direct                            and retroflexion views. Complications:  No immediate complications. Estimated Blood Loss:     Estimated blood loss: none. Impression:               - The entire examined colon is normal on direct and                            retroflexion views.                           - No specimens collected.                           - Personal history of colonic polyps.                           - 2009 - 15 removed - max 8 mm mix of adenomas and                            ssp's                           - 2011 - 3 polyps max 12 mm - right sided                            hyperplastic (serrated)                           - 05/14/2014 - 2 diminutive polyps, cecum and sigmoid                            - 2 hyperplastic Recommendation:           - Patient has a contact number available for                            emergencies. The signs and symptoms of potential                             delayed complications were discussed with the                            patient. Return to normal activities tomorrow.                            Written discharge instructions were provided to the                            patient.                           - Resume previous diet.                           - Continue present medications.                           - Repeat colonoscopy in  5 years for surveillance. Gatha Mayer, MD 09/17/2019 10:26:20 AM This report has been signed electronically.

## 2019-09-17 NOTE — Progress Notes (Signed)
Report to PACU, RN, vss, BBS= Clear.  

## 2019-09-21 ENCOUNTER — Telehealth: Payer: Self-pay | Admitting: *Deleted

## 2019-09-21 NOTE — Telephone Encounter (Signed)
  Follow up Call-  Call back number 09/17/2019  Post procedure Call Back phone  # [phone]  Permission to leave phone message Yes  Some recent data might be hidden     Patient questions:  Do you have a fever, pain , or abdominal swelling? No. Pain Score  0 *  Have you tolerated food without any problems? Yes.    Have you been able to return to your normal activities? Yes.    Do you have any questions about your discharge instructions: Diet   No. Medications  No. Follow up visit  No.  Do you have questions or concerns about your Care? No.  Actions: * If pain score is 4 or above: No action needed, pain <4.  1. Have you developed a fever since your procedure? no  2.   Have you had an respiratory symptoms (SOB or cough) since your procedure? no  3.   Have you tested positive for COVID 19 since your procedure no  4.   Have you had any family members/close contacts diagnosed with the COVID 19 since your procedure?  no   If yes to any of these questions please route to Joylene John, RN and Alphonsa Gin, Therapist, sports.

## 2019-10-09 DIAGNOSIS — M0579 Rheumatoid arthritis with rheumatoid factor of multiple sites without organ or systems involvement: Secondary | ICD-10-CM | POA: Diagnosis not present

## 2019-12-04 DIAGNOSIS — Z79899 Other long term (current) drug therapy: Secondary | ICD-10-CM | POA: Diagnosis not present

## 2019-12-04 DIAGNOSIS — M0579 Rheumatoid arthritis with rheumatoid factor of multiple sites without organ or systems involvement: Secondary | ICD-10-CM | POA: Diagnosis not present

## 2020-01-05 DIAGNOSIS — M859 Disorder of bone density and structure, unspecified: Secondary | ICD-10-CM | POA: Diagnosis not present

## 2020-01-05 DIAGNOSIS — R739 Hyperglycemia, unspecified: Secondary | ICD-10-CM | POA: Diagnosis not present

## 2020-01-05 DIAGNOSIS — E7849 Other hyperlipidemia: Secondary | ICD-10-CM | POA: Diagnosis not present

## 2020-01-11 DIAGNOSIS — M8580 Other specified disorders of bone density and structure, unspecified site: Secondary | ICD-10-CM | POA: Diagnosis not present

## 2020-01-11 DIAGNOSIS — M069 Rheumatoid arthritis, unspecified: Secondary | ICD-10-CM | POA: Diagnosis not present

## 2020-01-11 DIAGNOSIS — Z1339 Encounter for screening examination for other mental health and behavioral disorders: Secondary | ICD-10-CM | POA: Diagnosis not present

## 2020-01-11 DIAGNOSIS — Z0000 Encounter for general adult medical examination without abnormal findings: Secondary | ICD-10-CM | POA: Diagnosis not present

## 2020-01-11 DIAGNOSIS — N529 Male erectile dysfunction, unspecified: Secondary | ICD-10-CM | POA: Diagnosis not present

## 2020-01-11 DIAGNOSIS — I10 Essential (primary) hypertension: Secondary | ICD-10-CM | POA: Diagnosis not present

## 2020-01-11 DIAGNOSIS — R0683 Snoring: Secondary | ICD-10-CM | POA: Diagnosis not present

## 2020-01-11 DIAGNOSIS — R82998 Other abnormal findings in urine: Secondary | ICD-10-CM | POA: Diagnosis not present

## 2020-01-11 DIAGNOSIS — E785 Hyperlipidemia, unspecified: Secondary | ICD-10-CM | POA: Diagnosis not present

## 2020-01-11 DIAGNOSIS — R739 Hyperglycemia, unspecified: Secondary | ICD-10-CM | POA: Diagnosis not present

## 2020-01-11 DIAGNOSIS — E669 Obesity, unspecified: Secondary | ICD-10-CM | POA: Diagnosis not present

## 2020-01-11 DIAGNOSIS — Z1331 Encounter for screening for depression: Secondary | ICD-10-CM | POA: Diagnosis not present

## 2020-01-11 DIAGNOSIS — M5416 Radiculopathy, lumbar region: Secondary | ICD-10-CM | POA: Diagnosis not present

## 2020-01-14 DIAGNOSIS — Z1212 Encounter for screening for malignant neoplasm of rectum: Secondary | ICD-10-CM | POA: Diagnosis not present

## 2020-01-29 DIAGNOSIS — M0579 Rheumatoid arthritis with rheumatoid factor of multiple sites without organ or systems involvement: Secondary | ICD-10-CM | POA: Diagnosis not present

## 2020-02-01 DIAGNOSIS — K219 Gastro-esophageal reflux disease without esophagitis: Secondary | ICD-10-CM | POA: Insufficient documentation

## 2020-02-01 DIAGNOSIS — J309 Allergic rhinitis, unspecified: Secondary | ICD-10-CM | POA: Insufficient documentation

## 2020-02-01 DIAGNOSIS — J329 Chronic sinusitis, unspecified: Secondary | ICD-10-CM | POA: Diagnosis not present

## 2020-02-10 ENCOUNTER — Other Ambulatory Visit: Payer: Self-pay

## 2020-02-10 ENCOUNTER — Ambulatory Visit (INDEPENDENT_AMBULATORY_CARE_PROVIDER_SITE_OTHER): Payer: Medicare Other | Admitting: Neurology

## 2020-02-10 ENCOUNTER — Encounter: Payer: Self-pay | Admitting: Neurology

## 2020-02-10 VITALS — BP 143/90 | HR 71 | Ht 71.0 in | Wt 216.5 lb

## 2020-02-10 DIAGNOSIS — M2619 Other specified anomalies of jaw-cranial base relationship: Secondary | ICD-10-CM | POA: Diagnosis not present

## 2020-02-10 DIAGNOSIS — R0683 Snoring: Secondary | ICD-10-CM

## 2020-02-10 DIAGNOSIS — Z9089 Acquired absence of other organs: Secondary | ICD-10-CM | POA: Diagnosis not present

## 2020-02-10 DIAGNOSIS — B9689 Other specified bacterial agents as the cause of diseases classified elsewhere: Secondary | ICD-10-CM

## 2020-02-10 DIAGNOSIS — J329 Chronic sinusitis, unspecified: Secondary | ICD-10-CM | POA: Diagnosis not present

## 2020-02-10 DIAGNOSIS — G478 Other sleep disorders: Secondary | ICD-10-CM | POA: Diagnosis not present

## 2020-02-10 NOTE — Progress Notes (Signed)
SLEEP MEDICINE CLINIC    Provider:  Larey Seat, MD  Primary Care Physician:  [name], MD [place] [address]     Referring Provider: [name], [address] [place],  [place] [address]          Chief Complaint according to patient   Patient presents with:    . New Patient (Initial Visit)     Paper referral from Dr. Philip Aspen (PCP) for snoring/sleep apnea. No prior sleep study. He is currently being treated for sinus infection. He is seeing ENT for this. He is unsure if he wants to do sleep study, but came today to speak with MD about next steps.      HISTORY OF PRESENT ILLNESS:  02-10-2020: Frederick Bridges is a 69 y.o. year old  Caucasian male patient and seen here upon referral on 02/10/2020 from Dr. Philip Aspen. Chief concern according to patient :  I have quite a bit of sinus trouble and was recently placed on ATB for this- by dr. Constance Holster.  I sometimes be awake for an hour before falling asleep, sometimes I wake earlier than desired"    I have the pleasure of seeing Frederick Bridges today, a right-handed White or Caucasian male with a possible sleep disorder.  He  has a past medical history of AAA (abdominal aortic aneurysm) ([place]), Allergy, Colon polyps, Hay fever, Nocturia, Personal history of colonic adenomas (12/22/2012), Rheumatoid arthritis ([place]), and Wears glasses, and has sinusitis. .   Sleep relevant medical history: Nocturia 2-3 , Tonsillectomy: yes, no ENT surgery.     Family medical /sleep history: No other family member on CPAP with OSA.   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  He lives in a household with spouse , the couple has adult children , daughter 39, son is 26, 4 grandchildren.  Pets are not present.  Tobacco use- quit after 35 years 12 years ago .  ETOH use : socially-,  Caffeine intake in form of Coffee( 2 cups a day) Soda( 3-4/week) Tea ( 3/ week ) or  energy drinks. Regular exercise in form of golf and walking.  Hobbies :golf on Tuesday , 3-4 beers a week.      Sleep habits are as follows: The patient's dinner time is between 6 PM. The patient goes to bed at 10.30-11.30 PM and reads in bed- continues to sleep for 4 hours, wakes for 1-2 bathroom breaks, the first time at 2-3 AM.   The preferred sleep position is on theright shoulder, with the support of 1 pillow, non- adjustable bed.   Dreams are reportedly frequent.Marland Kitchen  7.30 AM is the usual rise time. The patient wakes up spontaneously.  He reports mostly feeling refreshed / restored in AM, with symptoms such as dry mouth , slight morning headaches , and residual fatigue.  Naps are not taken.    Review of Systems: Out of a complete 14 system review, the patient complains of only the following symptoms, and all other reviewed systems are negative.:  Fatigue, sleepiness , snoring, fragmented sleep, Insomnia -yes - difficulties falling asleep, reading on a kindle, having a clock projecting the time to the ceiling of the bedroom.    How likely are you to doze in the following situations: 0 = not likely, 1 = slight chance, 2 = moderate chance, 3 = high chance   Sitting and Reading? Watching Television? Sitting  inactive in a public place (theater or meeting)? As a passenger in a car for an hour without a break? Lying down in the afternoon when circumstances permit? Sitting and talking to someone? Sitting quietly after lunch without alcohol? In a car, while stopped for a few minutes in traffic?   Total = 7/ 24 points   FSS endorsed at 26/ 63 points.   GDS 0/ 15     Fit bit recording : up at 6-7 AM , average sleep time 6.45- 7.5 hours   Social History   Socioeconomic History  . Marital status: Married    Spouse name: Not on file  . Number of children: Not on file  . Years of education: Not on file  . Highest education level: Not on file  Occupational History  . Occupation:  retired    Fish farm manager: Frederick Bridges  Tobacco Use  . Smoking status: Former Smoker    Types: Cigarettes    Quit date: 07/10/2008    Years since quitting: 11.5  . Smokeless tobacco: Never Used  Substance and Sexual Activity  . Alcohol use: Yes    Alcohol/week: 12.0 standard drinks    Types: 12 Cans of beer per week    Comment: 12 cans per week  . Drug use: No  . Sexual activity: Not on file  Other Topics Concern  . Not on file  Social History Narrative   Left handed    Social Determinants of Health   Financial Resource Strain:   . Difficulty of Paying Living Expenses:   Food Insecurity:   . Worried About Charity fundraiser in the Last Year:   . Arboriculturist in the Last Year:   Transportation Needs:   . Film/video editor (Medical):   Marland Kitchen Lack of Transportation (Non-Medical):   Physical Activity:   . Days of Exercise per Week:   . Minutes of Exercise per Session:   Stress:   . Feeling of Stress :   Social Connections:   . Frequency of Communication with Friends and Family:   . Frequency of Social Gatherings with Friends and Family:   . Attends Religious Services:   . Active Member of Clubs or Organizations:   . Attends Archivist Meetings:   Marland Kitchen Marital Status:     Family History  Problem Relation Age of Onset  . Lung cancer Mother   . Lung cancer Brother   . Colon cancer Neg Hx   . Pancreatic cancer Neg Hx   . Rectal cancer Neg Hx   . Stomach cancer Neg Hx   . Colon polyps Neg Hx   . Esophageal cancer Neg Hx     Past Medical History:  Diagnosis Date  . AAA (abdominal aortic aneurysm) ([place])   . Allergy    hayfever  . Colon polyps   . Hay fever   . Nocturia   . Personal history of colonic adenomas 12/22/2012  . Rheumatoid arthritis ([place])   . Wears glasses     Past Surgical History:  Procedure Laterality Date  . COLONOSCOPY  multiple  . INGUINAL HERNIA REPAIR    . PARATHYROIDECTOMY N/A 09/02/2015   Procedure:  PARATHYROIDECTOMY;  Surgeon: Armandina Gemma, MD;  Location: [facility];  Service: General;  Laterality: N/A;  . TONSILLECTOMY     as a child     Current Outpatient Medications on File Prior to Visit  Medication Sig Dispense Refill  . amoxicillin-clavulanate (AUGMENTIN) 875-125 MG tablet Take  1 tablet by mouth 2 (two) times daily.    . Cholecalciferol (VITAMIN D3 PO) Take 5,000 Units by mouth daily.    . Cyanocobalamin (B-12 PO) Take 5,000 mg by mouth daily.    Marland Kitchen golimumab (SIMPONI ARIA) 50 MG/4ML SOLN injection every 8 (eight) weeks.     . Magnesium 100 MG CAPS Take 800 mg by mouth.     . Multiple Vitamin (MULTI VITAMIN MENS PO) Take 1 tablet by mouth daily.    . Red Yeast Rice Extract (RED YEAST RICE PO) Take 600 mg by mouth daily.     . Turmeric (QC TUMERIC COMPLEX PO) Take 1 capsule by mouth daily.    Marland Kitchen Ubiquinol 100 MG CAPS Take 1 Dose by mouth daily.    Marland Kitchen UNABLE TO FIND Take 125 mg by mouth daily. Med Name: tocotriends    . zinc gluconate 50 MG tablet Take 50 mg by mouth daily.     No current facility-administered medications on file prior to visit.    No Known Allergies  Physical exam:  Today's Vitals   02/10/20 0952  BP: (!) 143/90  Pulse: 71  SpO2: 96%  Weight: 216 lb 8 oz (98.2 kg)  Height: 5\' 11"  (1.803 m)   Body mass index is 30.2 kg/m.   Wt Readings from Last 3 Encounters:  02/10/20 216 lb 8 oz (98.2 kg)  09/17/19 215 lb (97.5 kg)  09/02/19 215 lb 6.4 oz (97.7 kg)     Ht Readings from Last 3 Encounters:  02/10/20 5\' 10"  (1.77803 m)  09/17/19 5\' 10"  (1.778 m)  09/02/19 5\' 10"  (1.778 m)      General: The patient is awake, alert and appears not in acute distress. The patient is well groomed. Head: Normocephalic, atraumatic. Neck is supple. Mallampati 3 plus- very small airway.  ,  neck circumference:16. 5 inches . Scalloped tongue- crowded dentition.  Nasal airflow barely patent.  Retrognathia is mild   Dental status: intact  Cardiovascular:  Regular rate and  cardiac rhythm by pulse,  without distended neck veins. Respiratory: Lungs are clear to auscultation.  Skin:  Without evidence of ankle edema, or rash. Trunk: The patient's posture is erect.   Neurologic exam : The patient is awake and alert, oriented to place and time.   Memory subjective described as intact.  Attention span & concentration ability appears normal.  Speech is fluent,  without  dysarthria, dysphonia or aphasia.  Mood and affect are appropriate.   Cranial nerves: no loss of smell or taste reported - he had reduced taste while having sinus infection.    Pupils are equal and briskly reactive to light. Funduscopic exam deferred. .  Extraocular movements in vertical and horizontal planes were intact and without nystagmus. No Diplopia. Visual fields by finger perimetry are intact. Hearing was intact to soft voice and finger rubbing.   Facial sensation intact to fine touch. Facial motor strength is symmetric and tongue and uvula move midline.  Neck ROM : rotation, tilt and flexion extension were normal for age and shoulder shrug was symmetrical.  Motor exam:  Symmetric bulk, tone and ROM.   Normal tone without cog wheeling, symmetric grip strength . Sensory:  Fine touch, pinprick and vibration were tested  and  normal.  Proprioception tested in the upper extremities was normal. Coordination: Rapid alternating movements in the fingers/hands were of normal speed.  The Finger-to-nose maneuver was intact without evidence of ataxia, dysmetria or tremor.  Gait and station: Patient could  rise unassisted from a seated position, walked without assistive device.  Stance is of normal width/ base .  Toe and heel walk were deferred.  Deep tendon reflexes: in the  upper and lower extremities are symmetric and intact.  Babinski response was deferred.     After spending a total time of 40 minutes face to face and additional time for physical and neurologic examination, review of  laboratory studies,  personal review of imaging studies, reports and results of other testing and review of referral information / records as far as provided in visit, I have established the following assessments:  1)  Here with loud snoring, non - restorative sleep, and frequent Sinusitis.  2)  High in risk for OSA -he has a small upper airway, small jaw, and functionally larger tongue.  may need CPAP     My Plan is to proceed with:  1) HST or PSG. Patient is not vaccinated.  I would like to thank [name], MD , [address] [place],  [place] [address] for allowing me to meet with and to take care of this pleasant patient.   In short, Frederick Bridges is presenting with very loud snoring, sinusitis, morning headaches.   I plan to follow up either personally or through our NP within 2-3 month.     Electronically signed by: Larey Seat, MD 02/10/2020 10:08 AM  [facility] Board certified by The [employer] of Sleep Medicine and Diplomate of the [employer] of Sleep Medicine. Board certified In Neurology through the [place], Fellow of the [employer] of Neurology. Medical Director of [facility].

## 2020-02-10 NOTE — Patient Instructions (Signed)

## 2020-02-16 ENCOUNTER — Telehealth: Payer: Self-pay

## 2020-02-16 NOTE — Telephone Encounter (Signed)
LVM for pt to call me back to schedule sleep study  

## 2020-04-04 ENCOUNTER — Ambulatory Visit (INDEPENDENT_AMBULATORY_CARE_PROVIDER_SITE_OTHER): Payer: Medicare Other | Admitting: Neurology

## 2020-04-04 DIAGNOSIS — G478 Other sleep disorders: Secondary | ICD-10-CM

## 2020-04-04 DIAGNOSIS — J329 Chronic sinusitis, unspecified: Secondary | ICD-10-CM

## 2020-04-04 DIAGNOSIS — G4733 Obstructive sleep apnea (adult) (pediatric): Secondary | ICD-10-CM | POA: Diagnosis not present

## 2020-04-04 DIAGNOSIS — R0683 Snoring: Secondary | ICD-10-CM | POA: Diagnosis not present

## 2020-04-04 DIAGNOSIS — M2619 Other specified anomalies of jaw-cranial base relationship: Secondary | ICD-10-CM

## 2020-04-04 DIAGNOSIS — B9689 Other specified bacterial agents as the cause of diseases classified elsewhere: Secondary | ICD-10-CM

## 2020-04-04 DIAGNOSIS — Z9089 Acquired absence of other organs: Secondary | ICD-10-CM

## 2020-04-08 DIAGNOSIS — M0579 Rheumatoid arthritis with rheumatoid factor of multiple sites without organ or systems involvement: Secondary | ICD-10-CM | POA: Diagnosis not present

## 2020-04-19 DIAGNOSIS — R0683 Snoring: Secondary | ICD-10-CM | POA: Insufficient documentation

## 2020-04-19 DIAGNOSIS — Z9089 Acquired absence of other organs: Secondary | ICD-10-CM | POA: Insufficient documentation

## 2020-04-19 DIAGNOSIS — B9689 Other specified bacterial agents as the cause of diseases classified elsewhere: Secondary | ICD-10-CM | POA: Insufficient documentation

## 2020-04-19 DIAGNOSIS — G478 Other sleep disorders: Secondary | ICD-10-CM | POA: Insufficient documentation

## 2020-04-19 DIAGNOSIS — J329 Chronic sinusitis, unspecified: Secondary | ICD-10-CM | POA: Insufficient documentation

## 2020-04-19 DIAGNOSIS — M2619 Other specified anomalies of jaw-cranial base relationship: Secondary | ICD-10-CM | POA: Insufficient documentation

## 2020-04-19 NOTE — Addendum Note (Signed)
Addended by: Larey Seat on: 04/19/2020 08:29 AM   Modules accepted: Orders

## 2020-04-19 NOTE — Procedures (Signed)
Patient Information     First Name: Frederick Last Name: Bridges ID: [id number]  Birth Date: 2050-12-31 Age: 69 Gender: Male  Referring Provider: Dr. Philip Aspen BMI: 30.2 (W=216 lbs, H=5' 11'')  Neck Circ.:  80 '' Epworth:  7/24   Sleep Study Information    Study Date: 04/04/20 S/H/A Version: 001.001.001.001 / 4.0.1515 / 77  History:    Frederick Bridges is a 69 y.o. year old Caucasian male patient and seen here upon referral on 02/10/2020 from Dr. Philip Aspen. Chief concern according to patient:  I have quite a bit of sinus trouble and was recently placed on ATB for this- by Dr. Constance Holster, MD  ENT. I sometimes will be awake for an hour before falling asleep, sometimes I wake earlier than desired". He has a past medical history of AAA (abdominal aortic aneurysm) ([place]), Allergy, Colon polyps, Hay fever, Nocturia, Personal history of colonic adenomas (12/22/2012), Rheumatoid arthritis ([place]), and Retrognathia, and this patient has frequent sinusitis.        Summary & Diagnosis:    Moderate sleep apnea at AHI 22/h and REM AHI of 20.4/h, with oxygen desaturation during REM sleep. Moderate snoring was present.   Recommendations:     While there is no REM sleep dependent apnea, there is REM dependent hypoxemia. In the setting of sinusitis, there may be little tolerance for CPAP use, which requires nasal patency.  I like for Mr. Larmer to try humidified CPAP at home - autotitration by device- with setting of 5-15 cm water, 1 cm EPR. the interface should be a non-FFM. If this is uncomfortable, please have the patient notify us immediately and we will try a dental device instead.   Interpreting Physician: Larey Seat, MD           Sleep Summary  Oxygen Saturation Statistics   Start Study Time: End Study Time: Total Recording Time:  11:44:59 PM 7:01:12 AM 7 h, 16 min  Total Sleep Time % REM of Sleep Time:  5 h, 50 min  15.3    Mean: 91 Minimum: 62 Maximum: 98  Mean of Desaturations Nadirs (%):   87   Oxygen Desaturation. %:   4-9 10-20 >20 Total  Events Number Total    51  9 82.3 14.5  2 3.2  62 100.0  Oxygen Saturation: <90 <=88 <85 <80 <70  Duration (minutes): Sleep % 24.0 6.8 20.8 9.4 5.9 2.7 5.8 1.7 1.7 0.5     Respiratory Indices      Total Events REM NREM All Night  pRDI:  149  pAHI:  126 ODI:  62  pAHIc:  2  % CSR: 0.0 21.5 20.4 14.7 0.0 26.9 22.3 10.1 0.5 26.0 22.0 10.8 0.4       Pulse Rate Statistics during Sleep (BPM)      Mean: 74 Minimum: 45 Maximum: 105    Indices are calculated using technically valid sleep time of 5 h, 43 min. Central-Indices are calculated using technically valid sleep time of 4 h, 56 min. pRDI/pAHI are calculated using oxi desaturations ? 3%  Body Position Statistics  Position Supine Prone Right Left Non-Supine  Sleep (min) 4.5 120.5 121.0 59.0 300.5  Sleep % 1.3 34.4 34.5 16.8 85.7  pRDI N/A 31.1 16.0 42.5 27.0  pAHI N/A 27.6 13.5 31.3 22.5  ODI N/A 16.1 5.5 15.7 11.7     Snoring Statistics Snoring Level (dB) >40 >50 >60 >70 >80 >Threshold (45)  Sleep (min) 109.3 15.0 0.8 0.0 0.0 43.9  Sleep %  31.2 4.3 0.2 0.0 0.0 12.5    Mean: 42 dB Sleep Stages Chart                                  pAHI=22.0                                              Mild              Moderate                    Severe                                                 5              15                    30

## 2020-04-19 NOTE — Progress Notes (Signed)
Cc Dr Philip Aspen, Summary & Diagnosis:   Moderate sleep apnea at AHI 22/h and REM AHI of 20.4/h, with  oxygen desaturation during REM sleep. Moderate snoring was  present.   Recommendations:    While there is no REM sleep dependent apnea, there is REM  dependent hypoxemia. In the setting of sinusitis, there may be  little tolerance for CPAP use, which requires nasal patency.  I like for Mr. Sobel to try humidified CPAP at home -  autotitration by device- with setting of 5-15 cm water, 1 cm EPR.  the interface should be a non-FFM. If this is uncomfortable,  please have the patient notify us immediately and we will try a  dental device instead.   Interpreting Physician: Larey Seat, MD

## 2020-04-20 ENCOUNTER — Telehealth: Payer: Self-pay | Admitting: Neurology

## 2020-04-20 DIAGNOSIS — R0683 Snoring: Secondary | ICD-10-CM

## 2020-04-20 NOTE — Telephone Encounter (Signed)
I called pt. I advised pt that Dr. Brett Fairy reviewed their sleep study results and found that pt has moderate sleep apnea. Dr. Brett Fairy 1st recommendation would be an auto CPAP. He saw where she mentioned the dental device and he prefers to try that. Advised the patient that her first treatment choice was auto CPAP. Advised that with the dental device a lot of the times insurance will require that he tries and failed cpap first. Patient would like to pursue dental referral and see if they will cover. Advised I would discuss with Dr Brett Fairy. Informed that if she proceeds forward with the dental device then we would forward the referral to dentist and they would contact him. Pt verbalized understanding. Patient also states that he will contact us if that does not work our and pursue CPAP at that point.

## 2020-04-20 NOTE — Telephone Encounter (Signed)
-----   Message from Larey Seat, MD sent at 04/19/2020  8:29 AM EDT ----- Cc Dr Philip Aspen, Summary & Diagnosis:   Moderate sleep apnea at AHI 22/h and REM AHI of 20.4/h, with  oxygen desaturation during REM sleep. Moderate snoring was  present.   Recommendations:    While there is no REM sleep dependent apnea, there is REM  dependent hypoxemia. In the setting of sinusitis, there may be  little tolerance for CPAP use, which requires nasal patency.  I like for Mr. Smeltzer to try humidified CPAP at home -  autotitration by device- with setting of 5-15 cm water, 1 cm EPR.  the interface should be a non-FFM. If this is uncomfortable,  please have the patient notify us immediately and we will try a  dental device instead.   Interpreting Physician: Larey Seat, MD

## 2020-04-21 DIAGNOSIS — L821 Other seborrheic keratosis: Secondary | ICD-10-CM | POA: Diagnosis not present

## 2020-04-21 DIAGNOSIS — L819 Disorder of pigmentation, unspecified: Secondary | ICD-10-CM | POA: Diagnosis not present

## 2020-04-21 DIAGNOSIS — D1801 Hemangioma of skin and subcutaneous tissue: Secondary | ICD-10-CM | POA: Diagnosis not present

## 2020-04-21 DIAGNOSIS — L918 Other hypertrophic disorders of the skin: Secondary | ICD-10-CM | POA: Diagnosis not present

## 2020-04-21 DIAGNOSIS — D225 Melanocytic nevi of trunk: Secondary | ICD-10-CM | POA: Diagnosis not present

## 2020-04-21 DIAGNOSIS — L578 Other skin changes due to chronic exposure to nonionizing radiation: Secondary | ICD-10-CM | POA: Diagnosis not present

## 2020-04-21 NOTE — Telephone Encounter (Signed)
Dr Dohmeier agreed to the dental device referral. Order placed.

## 2020-04-21 NOTE — Addendum Note (Signed)
Addended by: Darleen Crocker on: 04/21/2020 04:53 PM   Modules accepted: Orders

## 2020-04-25 DIAGNOSIS — J3081 Allergic rhinitis due to animal (cat) (dog) hair and dander: Secondary | ICD-10-CM | POA: Diagnosis not present

## 2020-04-25 DIAGNOSIS — J301 Allergic rhinitis due to pollen: Secondary | ICD-10-CM | POA: Diagnosis not present

## 2020-04-25 DIAGNOSIS — J3089 Other allergic rhinitis: Secondary | ICD-10-CM | POA: Diagnosis not present

## 2020-04-25 DIAGNOSIS — J300 Vasomotor rhinitis: Secondary | ICD-10-CM | POA: Diagnosis not present

## 2020-05-30 ENCOUNTER — Telehealth: Payer: Self-pay | Admitting: Neurology

## 2020-05-30 NOTE — Telephone Encounter (Signed)
Per Dr. Corky Sing office Patient did not want to schedule and oral sleep at this time due finical reasons.

## 2020-06-06 DIAGNOSIS — M0579 Rheumatoid arthritis with rheumatoid factor of multiple sites without organ or systems involvement: Secondary | ICD-10-CM | POA: Diagnosis not present

## 2020-07-05 DIAGNOSIS — J3081 Allergic rhinitis due to animal (cat) (dog) hair and dander: Secondary | ICD-10-CM | POA: Diagnosis not present

## 2020-07-05 DIAGNOSIS — J3089 Other allergic rhinitis: Secondary | ICD-10-CM | POA: Diagnosis not present

## 2020-07-05 DIAGNOSIS — J301 Allergic rhinitis due to pollen: Secondary | ICD-10-CM | POA: Diagnosis not present

## 2020-07-11 DIAGNOSIS — E785 Hyperlipidemia, unspecified: Secondary | ICD-10-CM | POA: Diagnosis not present

## 2020-07-11 DIAGNOSIS — M791 Myalgia, unspecified site: Secondary | ICD-10-CM | POA: Diagnosis not present

## 2020-07-11 DIAGNOSIS — E669 Obesity, unspecified: Secondary | ICD-10-CM | POA: Diagnosis not present

## 2020-07-11 DIAGNOSIS — M069 Rheumatoid arthritis, unspecified: Secondary | ICD-10-CM | POA: Diagnosis not present

## 2020-07-11 DIAGNOSIS — I10 Essential (primary) hypertension: Secondary | ICD-10-CM | POA: Diagnosis not present

## 2020-07-13 DIAGNOSIS — J3089 Other allergic rhinitis: Secondary | ICD-10-CM | POA: Diagnosis not present

## 2020-07-13 DIAGNOSIS — J3081 Allergic rhinitis due to animal (cat) (dog) hair and dander: Secondary | ICD-10-CM | POA: Diagnosis not present

## 2020-07-13 DIAGNOSIS — J301 Allergic rhinitis due to pollen: Secondary | ICD-10-CM | POA: Diagnosis not present

## 2020-07-15 DIAGNOSIS — J3089 Other allergic rhinitis: Secondary | ICD-10-CM | POA: Diagnosis not present

## 2020-07-15 DIAGNOSIS — J3081 Allergic rhinitis due to animal (cat) (dog) hair and dander: Secondary | ICD-10-CM | POA: Diagnosis not present

## 2020-07-15 DIAGNOSIS — J301 Allergic rhinitis due to pollen: Secondary | ICD-10-CM | POA: Diagnosis not present

## 2020-07-18 DIAGNOSIS — J3089 Other allergic rhinitis: Secondary | ICD-10-CM | POA: Diagnosis not present

## 2020-07-18 DIAGNOSIS — J3081 Allergic rhinitis due to animal (cat) (dog) hair and dander: Secondary | ICD-10-CM | POA: Diagnosis not present

## 2020-07-18 DIAGNOSIS — J301 Allergic rhinitis due to pollen: Secondary | ICD-10-CM | POA: Diagnosis not present

## 2020-07-20 DIAGNOSIS — J3089 Other allergic rhinitis: Secondary | ICD-10-CM | POA: Diagnosis not present

## 2020-07-20 DIAGNOSIS — J3081 Allergic rhinitis due to animal (cat) (dog) hair and dander: Secondary | ICD-10-CM | POA: Diagnosis not present

## 2020-07-20 DIAGNOSIS — J301 Allergic rhinitis due to pollen: Secondary | ICD-10-CM | POA: Diagnosis not present

## 2020-07-22 DIAGNOSIS — J301 Allergic rhinitis due to pollen: Secondary | ICD-10-CM | POA: Diagnosis not present

## 2020-07-22 DIAGNOSIS — J3081 Allergic rhinitis due to animal (cat) (dog) hair and dander: Secondary | ICD-10-CM | POA: Diagnosis not present

## 2020-07-22 DIAGNOSIS — J3089 Other allergic rhinitis: Secondary | ICD-10-CM | POA: Diagnosis not present

## 2020-07-25 DIAGNOSIS — J3089 Other allergic rhinitis: Secondary | ICD-10-CM | POA: Diagnosis not present

## 2020-07-25 DIAGNOSIS — J301 Allergic rhinitis due to pollen: Secondary | ICD-10-CM | POA: Diagnosis not present

## 2020-07-25 DIAGNOSIS — J3081 Allergic rhinitis due to animal (cat) (dog) hair and dander: Secondary | ICD-10-CM | POA: Diagnosis not present

## 2020-07-27 DIAGNOSIS — J301 Allergic rhinitis due to pollen: Secondary | ICD-10-CM | POA: Diagnosis not present

## 2020-07-27 DIAGNOSIS — J3089 Other allergic rhinitis: Secondary | ICD-10-CM | POA: Diagnosis not present

## 2020-07-27 DIAGNOSIS — J3081 Allergic rhinitis due to animal (cat) (dog) hair and dander: Secondary | ICD-10-CM | POA: Diagnosis not present

## 2020-07-29 DIAGNOSIS — J3081 Allergic rhinitis due to animal (cat) (dog) hair and dander: Secondary | ICD-10-CM | POA: Diagnosis not present

## 2020-07-29 DIAGNOSIS — J3089 Other allergic rhinitis: Secondary | ICD-10-CM | POA: Diagnosis not present

## 2020-07-29 DIAGNOSIS — J301 Allergic rhinitis due to pollen: Secondary | ICD-10-CM | POA: Diagnosis not present

## 2020-08-01 DIAGNOSIS — J301 Allergic rhinitis due to pollen: Secondary | ICD-10-CM | POA: Diagnosis not present

## 2020-08-01 DIAGNOSIS — J3081 Allergic rhinitis due to animal (cat) (dog) hair and dander: Secondary | ICD-10-CM | POA: Diagnosis not present

## 2020-08-01 DIAGNOSIS — Z79899 Other long term (current) drug therapy: Secondary | ICD-10-CM | POA: Diagnosis not present

## 2020-08-01 DIAGNOSIS — J3089 Other allergic rhinitis: Secondary | ICD-10-CM | POA: Diagnosis not present

## 2020-08-01 DIAGNOSIS — M0579 Rheumatoid arthritis with rheumatoid factor of multiple sites without organ or systems involvement: Secondary | ICD-10-CM | POA: Diagnosis not present

## 2020-08-08 DIAGNOSIS — J3081 Allergic rhinitis due to animal (cat) (dog) hair and dander: Secondary | ICD-10-CM | POA: Diagnosis not present

## 2020-08-08 DIAGNOSIS — J3089 Other allergic rhinitis: Secondary | ICD-10-CM | POA: Diagnosis not present

## 2020-08-08 DIAGNOSIS — J301 Allergic rhinitis due to pollen: Secondary | ICD-10-CM | POA: Diagnosis not present

## 2020-08-10 DIAGNOSIS — J3081 Allergic rhinitis due to animal (cat) (dog) hair and dander: Secondary | ICD-10-CM | POA: Diagnosis not present

## 2020-08-10 DIAGNOSIS — J3089 Other allergic rhinitis: Secondary | ICD-10-CM | POA: Diagnosis not present

## 2020-08-10 DIAGNOSIS — J301 Allergic rhinitis due to pollen: Secondary | ICD-10-CM | POA: Diagnosis not present

## 2020-08-16 DIAGNOSIS — J301 Allergic rhinitis due to pollen: Secondary | ICD-10-CM | POA: Diagnosis not present

## 2020-08-16 DIAGNOSIS — J3089 Other allergic rhinitis: Secondary | ICD-10-CM | POA: Diagnosis not present

## 2020-08-16 DIAGNOSIS — J3081 Allergic rhinitis due to animal (cat) (dog) hair and dander: Secondary | ICD-10-CM | POA: Diagnosis not present

## 2020-08-19 DIAGNOSIS — J301 Allergic rhinitis due to pollen: Secondary | ICD-10-CM | POA: Diagnosis not present

## 2020-08-19 DIAGNOSIS — J3081 Allergic rhinitis due to animal (cat) (dog) hair and dander: Secondary | ICD-10-CM | POA: Diagnosis not present

## 2020-08-19 DIAGNOSIS — J3089 Other allergic rhinitis: Secondary | ICD-10-CM | POA: Diagnosis not present

## 2020-08-22 DIAGNOSIS — J301 Allergic rhinitis due to pollen: Secondary | ICD-10-CM | POA: Diagnosis not present

## 2020-08-22 DIAGNOSIS — J3089 Other allergic rhinitis: Secondary | ICD-10-CM | POA: Diagnosis not present

## 2020-08-22 DIAGNOSIS — J3081 Allergic rhinitis due to animal (cat) (dog) hair and dander: Secondary | ICD-10-CM | POA: Diagnosis not present

## 2020-08-24 DIAGNOSIS — J301 Allergic rhinitis due to pollen: Secondary | ICD-10-CM | POA: Diagnosis not present

## 2020-08-24 DIAGNOSIS — J3089 Other allergic rhinitis: Secondary | ICD-10-CM | POA: Diagnosis not present

## 2020-08-24 DIAGNOSIS — J3081 Allergic rhinitis due to animal (cat) (dog) hair and dander: Secondary | ICD-10-CM | POA: Diagnosis not present

## 2020-08-29 DIAGNOSIS — J3089 Other allergic rhinitis: Secondary | ICD-10-CM | POA: Diagnosis not present

## 2020-08-29 DIAGNOSIS — J301 Allergic rhinitis due to pollen: Secondary | ICD-10-CM | POA: Diagnosis not present

## 2020-08-29 DIAGNOSIS — J3081 Allergic rhinitis due to animal (cat) (dog) hair and dander: Secondary | ICD-10-CM | POA: Diagnosis not present

## 2020-08-31 DIAGNOSIS — N390 Urinary tract infection, site not specified: Secondary | ICD-10-CM | POA: Diagnosis not present

## 2020-08-31 DIAGNOSIS — I10 Essential (primary) hypertension: Secondary | ICD-10-CM | POA: Diagnosis not present

## 2020-09-05 DIAGNOSIS — J3081 Allergic rhinitis due to animal (cat) (dog) hair and dander: Secondary | ICD-10-CM | POA: Diagnosis not present

## 2020-09-05 DIAGNOSIS — J3089 Other allergic rhinitis: Secondary | ICD-10-CM | POA: Diagnosis not present

## 2020-09-05 DIAGNOSIS — J301 Allergic rhinitis due to pollen: Secondary | ICD-10-CM | POA: Diagnosis not present

## 2020-09-06 DIAGNOSIS — Z79899 Other long term (current) drug therapy: Secondary | ICD-10-CM | POA: Diagnosis not present

## 2020-09-06 DIAGNOSIS — E669 Obesity, unspecified: Secondary | ICD-10-CM | POA: Diagnosis not present

## 2020-09-06 DIAGNOSIS — L409 Psoriasis, unspecified: Secondary | ICD-10-CM | POA: Diagnosis not present

## 2020-09-06 DIAGNOSIS — Z6830 Body mass index (BMI) 30.0-30.9, adult: Secondary | ICD-10-CM | POA: Diagnosis not present

## 2020-09-06 DIAGNOSIS — M47816 Spondylosis without myelopathy or radiculopathy, lumbar region: Secondary | ICD-10-CM | POA: Diagnosis not present

## 2020-09-06 DIAGNOSIS — M0579 Rheumatoid arthritis with rheumatoid factor of multiple sites without organ or systems involvement: Secondary | ICD-10-CM | POA: Diagnosis not present

## 2020-09-08 DIAGNOSIS — J301 Allergic rhinitis due to pollen: Secondary | ICD-10-CM | POA: Diagnosis not present

## 2020-09-08 DIAGNOSIS — J3089 Other allergic rhinitis: Secondary | ICD-10-CM | POA: Diagnosis not present

## 2020-09-08 DIAGNOSIS — J3081 Allergic rhinitis due to animal (cat) (dog) hair and dander: Secondary | ICD-10-CM | POA: Diagnosis not present

## 2020-09-13 DIAGNOSIS — R972 Elevated prostate specific antigen [PSA]: Secondary | ICD-10-CM | POA: Diagnosis not present

## 2020-09-13 DIAGNOSIS — R8290 Unspecified abnormal findings in urine: Secondary | ICD-10-CM | POA: Diagnosis not present

## 2020-09-13 DIAGNOSIS — N390 Urinary tract infection, site not specified: Secondary | ICD-10-CM | POA: Diagnosis not present

## 2020-09-15 DIAGNOSIS — J301 Allergic rhinitis due to pollen: Secondary | ICD-10-CM | POA: Diagnosis not present

## 2020-09-15 DIAGNOSIS — J3089 Other allergic rhinitis: Secondary | ICD-10-CM | POA: Diagnosis not present

## 2020-09-15 DIAGNOSIS — J3081 Allergic rhinitis due to animal (cat) (dog) hair and dander: Secondary | ICD-10-CM | POA: Diagnosis not present

## 2020-09-19 DIAGNOSIS — J3081 Allergic rhinitis due to animal (cat) (dog) hair and dander: Secondary | ICD-10-CM | POA: Diagnosis not present

## 2020-09-19 DIAGNOSIS — J301 Allergic rhinitis due to pollen: Secondary | ICD-10-CM | POA: Diagnosis not present

## 2020-09-19 DIAGNOSIS — J3089 Other allergic rhinitis: Secondary | ICD-10-CM | POA: Diagnosis not present

## 2020-09-21 DIAGNOSIS — J301 Allergic rhinitis due to pollen: Secondary | ICD-10-CM | POA: Diagnosis not present

## 2020-09-21 DIAGNOSIS — J300 Vasomotor rhinitis: Secondary | ICD-10-CM | POA: Diagnosis not present

## 2020-09-21 DIAGNOSIS — J3089 Other allergic rhinitis: Secondary | ICD-10-CM | POA: Diagnosis not present

## 2020-09-21 DIAGNOSIS — J3081 Allergic rhinitis due to animal (cat) (dog) hair and dander: Secondary | ICD-10-CM | POA: Diagnosis not present

## 2020-09-30 DIAGNOSIS — R319 Hematuria, unspecified: Secondary | ICD-10-CM | POA: Diagnosis not present

## 2020-09-30 DIAGNOSIS — R972 Elevated prostate specific antigen [PSA]: Secondary | ICD-10-CM | POA: Diagnosis not present

## 2020-10-10 DIAGNOSIS — M0579 Rheumatoid arthritis with rheumatoid factor of multiple sites without organ or systems involvement: Secondary | ICD-10-CM | POA: Diagnosis not present

## 2020-10-13 DIAGNOSIS — J3089 Other allergic rhinitis: Secondary | ICD-10-CM | POA: Diagnosis not present

## 2020-10-13 DIAGNOSIS — J301 Allergic rhinitis due to pollen: Secondary | ICD-10-CM | POA: Diagnosis not present

## 2020-10-13 DIAGNOSIS — J3081 Allergic rhinitis due to animal (cat) (dog) hair and dander: Secondary | ICD-10-CM | POA: Diagnosis not present

## 2020-10-17 DIAGNOSIS — J301 Allergic rhinitis due to pollen: Secondary | ICD-10-CM | POA: Diagnosis not present

## 2020-10-17 DIAGNOSIS — J3081 Allergic rhinitis due to animal (cat) (dog) hair and dander: Secondary | ICD-10-CM | POA: Diagnosis not present

## 2020-10-17 DIAGNOSIS — J3089 Other allergic rhinitis: Secondary | ICD-10-CM | POA: Diagnosis not present

## 2020-10-19 DIAGNOSIS — J3081 Allergic rhinitis due to animal (cat) (dog) hair and dander: Secondary | ICD-10-CM | POA: Diagnosis not present

## 2020-10-19 DIAGNOSIS — J301 Allergic rhinitis due to pollen: Secondary | ICD-10-CM | POA: Diagnosis not present

## 2020-10-19 DIAGNOSIS — J3089 Other allergic rhinitis: Secondary | ICD-10-CM | POA: Diagnosis not present

## 2020-10-25 DIAGNOSIS — J3089 Other allergic rhinitis: Secondary | ICD-10-CM | POA: Diagnosis not present

## 2020-10-25 DIAGNOSIS — J3081 Allergic rhinitis due to animal (cat) (dog) hair and dander: Secondary | ICD-10-CM | POA: Diagnosis not present

## 2020-10-25 DIAGNOSIS — J301 Allergic rhinitis due to pollen: Secondary | ICD-10-CM | POA: Diagnosis not present

## 2020-10-28 DIAGNOSIS — J3089 Other allergic rhinitis: Secondary | ICD-10-CM | POA: Diagnosis not present

## 2020-10-28 DIAGNOSIS — J301 Allergic rhinitis due to pollen: Secondary | ICD-10-CM | POA: Diagnosis not present

## 2020-10-28 DIAGNOSIS — J3081 Allergic rhinitis due to animal (cat) (dog) hair and dander: Secondary | ICD-10-CM | POA: Diagnosis not present

## 2020-10-31 DIAGNOSIS — J3089 Other allergic rhinitis: Secondary | ICD-10-CM | POA: Diagnosis not present

## 2020-10-31 DIAGNOSIS — J301 Allergic rhinitis due to pollen: Secondary | ICD-10-CM | POA: Diagnosis not present

## 2020-10-31 DIAGNOSIS — J3081 Allergic rhinitis due to animal (cat) (dog) hair and dander: Secondary | ICD-10-CM | POA: Diagnosis not present

## 2020-11-04 DIAGNOSIS — J301 Allergic rhinitis due to pollen: Secondary | ICD-10-CM | POA: Diagnosis not present

## 2020-11-04 DIAGNOSIS — J3081 Allergic rhinitis due to animal (cat) (dog) hair and dander: Secondary | ICD-10-CM | POA: Diagnosis not present

## 2020-11-04 DIAGNOSIS — J3089 Other allergic rhinitis: Secondary | ICD-10-CM | POA: Diagnosis not present

## 2020-11-07 DIAGNOSIS — J301 Allergic rhinitis due to pollen: Secondary | ICD-10-CM | POA: Diagnosis not present

## 2020-11-07 DIAGNOSIS — J3089 Other allergic rhinitis: Secondary | ICD-10-CM | POA: Diagnosis not present

## 2020-11-07 DIAGNOSIS — J3081 Allergic rhinitis due to animal (cat) (dog) hair and dander: Secondary | ICD-10-CM | POA: Diagnosis not present

## 2020-11-10 DIAGNOSIS — J301 Allergic rhinitis due to pollen: Secondary | ICD-10-CM | POA: Diagnosis not present

## 2020-11-10 DIAGNOSIS — J3089 Other allergic rhinitis: Secondary | ICD-10-CM | POA: Diagnosis not present

## 2020-11-10 DIAGNOSIS — J3081 Allergic rhinitis due to animal (cat) (dog) hair and dander: Secondary | ICD-10-CM | POA: Diagnosis not present

## 2020-11-14 DIAGNOSIS — J3089 Other allergic rhinitis: Secondary | ICD-10-CM | POA: Diagnosis not present

## 2020-11-14 DIAGNOSIS — J301 Allergic rhinitis due to pollen: Secondary | ICD-10-CM | POA: Diagnosis not present

## 2020-11-14 DIAGNOSIS — M0579 Rheumatoid arthritis with rheumatoid factor of multiple sites without organ or systems involvement: Secondary | ICD-10-CM | POA: Diagnosis not present

## 2020-11-14 DIAGNOSIS — J3081 Allergic rhinitis due to animal (cat) (dog) hair and dander: Secondary | ICD-10-CM | POA: Diagnosis not present

## 2020-11-16 ENCOUNTER — Ambulatory Visit (HOSPITAL_COMMUNITY)
Admission: RE | Admit: 2020-11-16 | Discharge: 2020-11-16 | Disposition: A | Discharge status: Home / Self Care | Payer: Medicare Other | Source: Ambulatory Visit | Attending: Internal Medicine | Admitting: Internal Medicine

## 2020-11-16 ENCOUNTER — Other Ambulatory Visit: Payer: Self-pay

## 2020-11-16 ENCOUNTER — Other Ambulatory Visit (HOSPITAL_COMMUNITY): Payer: Self-pay | Admitting: Internal Medicine

## 2020-11-16 DIAGNOSIS — M7989 Other specified soft tissue disorders: Secondary | ICD-10-CM | POA: Insufficient documentation

## 2020-11-16 DIAGNOSIS — M069 Rheumatoid arthritis, unspecified: Secondary | ICD-10-CM | POA: Diagnosis not present

## 2020-11-23 DIAGNOSIS — J3081 Allergic rhinitis due to animal (cat) (dog) hair and dander: Secondary | ICD-10-CM | POA: Diagnosis not present

## 2020-11-23 DIAGNOSIS — J3089 Other allergic rhinitis: Secondary | ICD-10-CM | POA: Diagnosis not present

## 2020-11-23 DIAGNOSIS — J301 Allergic rhinitis due to pollen: Secondary | ICD-10-CM | POA: Diagnosis not present

## 2020-11-30 DIAGNOSIS — J301 Allergic rhinitis due to pollen: Secondary | ICD-10-CM | POA: Diagnosis not present

## 2020-11-30 DIAGNOSIS — J3081 Allergic rhinitis due to animal (cat) (dog) hair and dander: Secondary | ICD-10-CM | POA: Diagnosis not present

## 2020-11-30 DIAGNOSIS — J3089 Other allergic rhinitis: Secondary | ICD-10-CM | POA: Diagnosis not present

## 2020-12-05 DIAGNOSIS — M0579 Rheumatoid arthritis with rheumatoid factor of multiple sites without organ or systems involvement: Secondary | ICD-10-CM | POA: Diagnosis not present

## 2020-12-06 DIAGNOSIS — E669 Obesity, unspecified: Secondary | ICD-10-CM | POA: Diagnosis not present

## 2020-12-06 DIAGNOSIS — M25562 Pain in left knee: Secondary | ICD-10-CM | POA: Diagnosis not present

## 2020-12-06 DIAGNOSIS — L409 Psoriasis, unspecified: Secondary | ICD-10-CM | POA: Diagnosis not present

## 2020-12-06 DIAGNOSIS — M2550 Pain in unspecified joint: Secondary | ICD-10-CM | POA: Diagnosis not present

## 2020-12-06 DIAGNOSIS — Z79899 Other long term (current) drug therapy: Secondary | ICD-10-CM | POA: Diagnosis not present

## 2020-12-06 DIAGNOSIS — Z6831 Body mass index (BMI) 31.0-31.9, adult: Secondary | ICD-10-CM | POA: Diagnosis not present

## 2020-12-06 DIAGNOSIS — M0579 Rheumatoid arthritis with rheumatoid factor of multiple sites without organ or systems involvement: Secondary | ICD-10-CM | POA: Diagnosis not present

## 2020-12-06 DIAGNOSIS — M47816 Spondylosis without myelopathy or radiculopathy, lumbar region: Secondary | ICD-10-CM | POA: Diagnosis not present

## 2020-12-07 DIAGNOSIS — J3081 Allergic rhinitis due to animal (cat) (dog) hair and dander: Secondary | ICD-10-CM | POA: Diagnosis not present

## 2020-12-07 DIAGNOSIS — J300 Vasomotor rhinitis: Secondary | ICD-10-CM | POA: Diagnosis not present

## 2020-12-07 DIAGNOSIS — J3089 Other allergic rhinitis: Secondary | ICD-10-CM | POA: Diagnosis not present

## 2020-12-07 DIAGNOSIS — J301 Allergic rhinitis due to pollen: Secondary | ICD-10-CM | POA: Diagnosis not present

## 2020-12-14 DIAGNOSIS — J301 Allergic rhinitis due to pollen: Secondary | ICD-10-CM | POA: Diagnosis not present

## 2020-12-14 DIAGNOSIS — J3089 Other allergic rhinitis: Secondary | ICD-10-CM | POA: Diagnosis not present

## 2020-12-14 DIAGNOSIS — J3081 Allergic rhinitis due to animal (cat) (dog) hair and dander: Secondary | ICD-10-CM | POA: Diagnosis not present

## 2020-12-21 DIAGNOSIS — J301 Allergic rhinitis due to pollen: Secondary | ICD-10-CM | POA: Diagnosis not present

## 2020-12-21 DIAGNOSIS — J3081 Allergic rhinitis due to animal (cat) (dog) hair and dander: Secondary | ICD-10-CM | POA: Diagnosis not present

## 2020-12-21 DIAGNOSIS — J3089 Other allergic rhinitis: Secondary | ICD-10-CM | POA: Diagnosis not present

## 2020-12-26 DIAGNOSIS — M25562 Pain in left knee: Secondary | ICD-10-CM | POA: Diagnosis not present

## 2020-12-28 DIAGNOSIS — J3089 Other allergic rhinitis: Secondary | ICD-10-CM | POA: Diagnosis not present

## 2020-12-28 DIAGNOSIS — J3081 Allergic rhinitis due to animal (cat) (dog) hair and dander: Secondary | ICD-10-CM | POA: Diagnosis not present

## 2020-12-28 DIAGNOSIS — J301 Allergic rhinitis due to pollen: Secondary | ICD-10-CM | POA: Diagnosis not present

## 2021-01-06 DIAGNOSIS — J3081 Allergic rhinitis due to animal (cat) (dog) hair and dander: Secondary | ICD-10-CM | POA: Diagnosis not present

## 2021-01-06 DIAGNOSIS — J301 Allergic rhinitis due to pollen: Secondary | ICD-10-CM | POA: Diagnosis not present

## 2021-01-06 DIAGNOSIS — J3089 Other allergic rhinitis: Secondary | ICD-10-CM | POA: Diagnosis not present

## 2021-01-09 DIAGNOSIS — E785 Hyperlipidemia, unspecified: Secondary | ICD-10-CM | POA: Diagnosis not present

## 2021-01-09 DIAGNOSIS — R739 Hyperglycemia, unspecified: Secondary | ICD-10-CM | POA: Diagnosis not present

## 2021-01-09 DIAGNOSIS — Z125 Encounter for screening for malignant neoplasm of prostate: Secondary | ICD-10-CM | POA: Diagnosis not present

## 2021-01-13 DIAGNOSIS — J3089 Other allergic rhinitis: Secondary | ICD-10-CM | POA: Diagnosis not present

## 2021-01-13 DIAGNOSIS — J301 Allergic rhinitis due to pollen: Secondary | ICD-10-CM | POA: Diagnosis not present

## 2021-01-13 DIAGNOSIS — J3081 Allergic rhinitis due to animal (cat) (dog) hair and dander: Secondary | ICD-10-CM | POA: Diagnosis not present

## 2021-01-16 DIAGNOSIS — R739 Hyperglycemia, unspecified: Secondary | ICD-10-CM | POA: Diagnosis not present

## 2021-01-16 DIAGNOSIS — I4891 Unspecified atrial fibrillation: Secondary | ICD-10-CM | POA: Diagnosis not present

## 2021-01-16 DIAGNOSIS — R002 Palpitations: Secondary | ICD-10-CM | POA: Diagnosis not present

## 2021-01-16 DIAGNOSIS — G4733 Obstructive sleep apnea (adult) (pediatric): Secondary | ICD-10-CM | POA: Diagnosis not present

## 2021-01-16 DIAGNOSIS — J302 Other seasonal allergic rhinitis: Secondary | ICD-10-CM | POA: Diagnosis not present

## 2021-01-16 DIAGNOSIS — K921 Melena: Secondary | ICD-10-CM | POA: Diagnosis not present

## 2021-01-16 DIAGNOSIS — M069 Rheumatoid arthritis, unspecified: Secondary | ICD-10-CM | POA: Diagnosis not present

## 2021-01-16 DIAGNOSIS — E785 Hyperlipidemia, unspecified: Secondary | ICD-10-CM | POA: Diagnosis not present

## 2021-01-16 DIAGNOSIS — M8580 Other specified disorders of bone density and structure, unspecified site: Secondary | ICD-10-CM | POA: Diagnosis not present

## 2021-01-16 DIAGNOSIS — I10 Essential (primary) hypertension: Secondary | ICD-10-CM | POA: Diagnosis not present

## 2021-01-16 DIAGNOSIS — Z0000 Encounter for general adult medical examination without abnormal findings: Secondary | ICD-10-CM | POA: Diagnosis not present

## 2021-01-16 DIAGNOSIS — R82998 Other abnormal findings in urine: Secondary | ICD-10-CM | POA: Diagnosis not present

## 2021-01-16 DIAGNOSIS — T466X5A Adverse effect of antihyperlipidemic and antiarteriosclerotic drugs, initial encounter: Secondary | ICD-10-CM | POA: Diagnosis not present

## 2021-01-16 DIAGNOSIS — E669 Obesity, unspecified: Secondary | ICD-10-CM | POA: Diagnosis not present

## 2021-01-18 DIAGNOSIS — G4733 Obstructive sleep apnea (adult) (pediatric): Secondary | ICD-10-CM | POA: Diagnosis not present

## 2021-01-18 DIAGNOSIS — J3089 Other allergic rhinitis: Secondary | ICD-10-CM | POA: Diagnosis not present

## 2021-01-18 DIAGNOSIS — J3081 Allergic rhinitis due to animal (cat) (dog) hair and dander: Secondary | ICD-10-CM | POA: Diagnosis not present

## 2021-01-18 DIAGNOSIS — J301 Allergic rhinitis due to pollen: Secondary | ICD-10-CM | POA: Diagnosis not present

## 2021-01-20 ENCOUNTER — Other Ambulatory Visit: Payer: Self-pay

## 2021-01-20 ENCOUNTER — Ambulatory Visit (INDEPENDENT_AMBULATORY_CARE_PROVIDER_SITE_OTHER): Payer: Medicare Other | Admitting: Interventional Cardiology

## 2021-01-20 ENCOUNTER — Encounter: Payer: Self-pay | Admitting: Interventional Cardiology

## 2021-01-20 VITALS — BP 130/80 | HR 94 | Ht 71.0 in | Wt 217.6 lb

## 2021-01-20 DIAGNOSIS — I4891 Unspecified atrial fibrillation: Secondary | ICD-10-CM | POA: Diagnosis not present

## 2021-01-20 DIAGNOSIS — E782 Mixed hyperlipidemia: Secondary | ICD-10-CM | POA: Diagnosis not present

## 2021-01-20 DIAGNOSIS — I714 Abdominal aortic aneurysm, without rupture, unspecified: Secondary | ICD-10-CM

## 2021-01-20 NOTE — Patient Instructions (Signed)
Medication Instructions:  Your physician recommends that you continue on your current medications as directed. Please refer to the Current Medication list given to you today.  *If you need a refill on your cardiac medications before your next appointment, please call your pharmacy*   Lab Work: CBC and BMET If you have labs (blood work) drawn today and your tests are completely normal, you will receive your results only by: Marland Kitchen MyChart Message (if you have MyChart) OR . A paper copy in the mail If you have any lab test that is abnormal or we need to change your treatment, we will call you to review the results.   Testing/Procedures: Your physician has requested that you have an echocardiogram. Echocardiography is a painless test that uses sound waves to create images of your heart. It provides your doctor with information about the size and shape of your heart and how well your heart's chambers and valves are working. This procedure takes approximately one hour. There are no restrictions for this procedure.     Follow-Up: At [facility], you and your health needs are our priority.  As part of our continuing mission to provide you with exceptional heart care, we have created designated Provider Care Teams.  These Care Teams include your primary Cardiologist (physician) and Advanced Practice Providers (APPs -  Physician Assistants and Nurse Practitioners) who all work together to provide you with the care you need, when you need it.  We recommend signing up for the patient portal called "MyChart".  Sign up information is provided on this After Visit Summary.  MyChart is used to connect with patients for Virtual Visits (Telemedicine).  Patients are able to view lab/test results, encounter notes, upcoming appointments, etc.  Non-urgent messages can be sent to your provider as well.   To learn more about what you can do with MyChart, go to [email or web address].    Your next appointment:   6  week(s)  The format for your next appointment:   In Person  Provider:   You may see Dr. [name] or one of the following Advanced Practice Providers on your designated Care Team:    Melina Copa, PA-C  Ermalinda Barrios, PA-C    Other Instructions  Dear  Frederick Bridges You are scheduled for a TEE/Cardioversion/TEE Cardioversion on February 20, 2021 with Dr. Earlene Plater.  Please arrive at the [facility] (Main Entrance A) at [place]: [address] [place], [place] [address] at 9:00 am/pm. (1 hour prior to procedure unless lab work is needed; if lab work is needed arrive 1.5 hours ahead)  DIET: Nothing to eat or drink after midnight except a sip of water with medications (see medication instructions below)  Medication Instructions:  Continue your anticoagulant: Eliquis You will need to continue your anticoagulant after your procedure until you  are told by your  Provider that it is safe to stop   Labs: If patient is on Coumadin, patient needs pt/INR, CBC, BMET within 3 days (No pt/INR needed for patients taking Xarelto, Eliquis, Pradaxa) For patients receiving anesthesia for TEE and all Cardioversion patients: BMET, CBC within 1 week  Come to:  Come to the lab at [facility] between the hours of 8:00 am and 4:30 pm. You do not have to be fasting on February 13, 2021  You must have a responsible person to drive you home and stay in the waiting area during your procedure. Failure to do so could result in cancellation.  Bring  your insurance cards.  *Special Note: Every effort is made to have your procedure done on time. Occasionally there are emergencies that occur at the hospital that may cause delays. Please be patient if a delay does occur.

## 2021-01-20 NOTE — Progress Notes (Signed)
Cardiology Office Note   Date:  01/20/2021   ID:  Frederick, Bridges 02/12/1951, MRN [id number]  PCP:  [name], MD    No chief complaint on file.  Atrial fibrillation  Wt Readings from Last 3 Encounters:  01/20/21 217 lb 9.6 oz (98.7 kg)  02/10/20 216 lb 8 oz (98.2 kg)  09/17/19 215 lb (97.5 kg)       History of Present Illness: Frederick Bridges is a 70 y.o. male who is being seen today for the evaluation of atrial fibrillation at the request of [name], MD.  He had a UTI in 12/21.  He reacted to two different antibiotics.    He had fluid on his knee in Jan 2022.  DVT was ruled out.  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 He got a cortisone shot- Dr. Trudie Reed. THen he went to Dr. Maureen Ralphs. 25 cc fluid drained, and more cortisone given. Knee is better.   Dr. [name] detected AFib on 01/16/21.    He declined atorvastatin in the past.  Used Enbrel for RA.    Denies : Chest pain. Dizziness. Leg edema. Nitroglycerin use. Orthopnea. Palpitations. Paroxysmal nocturnal dyspnea. Shortness of breath. Syncope.       Past Medical History:  Diagnosis Date  . A-fib ([place])   . AAA (abdominal aortic aneurysm) ([place])   . Allergy    hayfever  . Colon polyps   . Hay fever   . Nocturia   . Personal history of colonic adenomas 12/22/2012  . Rheumatoid arthritis ([place])   . Wears glasses     Past Surgical History:  Procedure Laterality Date  . COLONOSCOPY  multiple  . INGUINAL HERNIA REPAIR    . PARATHYROIDECTOMY N/A 09/02/2015   Procedure: PARATHYROIDECTOMY;  Surgeon: Armandina Gemma, MD;  Location: [facility];  Service: General;  Laterality: N/A;  . TONSILLECTOMY     as a child     Current Outpatient Medications  Medication Sig Dispense Refill  . amoxicillin-clavulanate (AUGMENTIN) 875-125 MG tablet Take 1 tablet by mouth 2 (two) times daily.    Marland Kitchen ascorbic acid (VITAMIN C) 100 MG tablet Take by mouth.    . Azelastine HCl 0.15 % SOLN Place 2 sprays into both nostrils 2 (two)  times daily.    . cetirizine (ZYRTEC) 10 MG tablet 1 tablet    . Cholecalciferol (VITAMIN D3 PO) Take 5,000 Units by mouth daily.    Marland Kitchen co-enzyme Q-10 30 MG capsule Take by mouth.    . Cyanocobalamin (B-12 PO) Take 5,000 mg by mouth daily.    Marland Kitchen ELIQUIS 5 MG TABS tablet Take 1 tablet by mouth 2 (two) times daily.    Marland Kitchen EPINEPHrine 0.3 mg/0.3 mL IJ SOAJ injection See admin instructions.    . fexofenadine (ALLEGRA) 180 MG tablet 1 tablet    . glucose 4 GM chewable tablet Chew by mouth.    Marland Kitchen golimumab (SIMPONI ARIA) 50 MG/4ML SOLN injection every 8 (eight) weeks.     . Magnesium 100 MG CAPS Take 800 mg by mouth.     . metoprolol succinate (TOPROL-XL) 25 MG 24 hr tablet Take 1 tablet by mouth daily.    . Multiple Vitamin (MULTI VITAMIN MENS PO) Take 1 tablet by mouth daily.    . naproxen sodium (ALEVE) 220 MG tablet Take by mouth.    . Red Yeast Rice Extract (RED YEAST RICE PO) Take 600 mg by mouth daily.     . tamsulosin (FLOMAX) 0.4 MG CAPS  capsule Take 0.4 mg by mouth daily.    . Turmeric (QC TUMERIC COMPLEX PO) Take 1 capsule by mouth daily.    Marland Kitchen Ubiquinol 100 MG CAPS Take 1 Dose by mouth daily.    Marland Kitchen UNABLE TO FIND Take 125 mg by mouth daily. Med Name: tocotriends    . zinc gluconate 50 MG tablet Take 50 mg by mouth daily.     No current facility-administered medications for this visit.    Allergies:   Ciprofloxacin, Other, and Sulfa antibiotics    Social History:  The patient  reports that he quit smoking about 12 years ago. His smoking use included cigarettes. He has never used smokeless tobacco. He reports current alcohol use of about 12.0 standard drinks of alcohol per week. He reports that he does not use drugs.   Family History:  The patient's family history includes Lung cancer in his brother and mother.    ROS:  Please see the history of present illness.   Otherwise, review of systems are positive for not wanting to take statins.   All other systems are reviewed and negative.     PHYSICAL EXAM: VS:  BP 130/80   Pulse 94   Ht 5\' 11"  (1.803 m)   Wt 217 lb 9.6 oz (98.7 kg)   SpO2 98%   BMI 30.35 kg/m  , BMI Body mass index is 30.35 kg/m. GEN: Well nourished, well developed, in no acute distress  HEENT: normal  Neck: no JVD, carotid bruits, or masses Cardiac: *irregularly iregular; no murmurs, rubs, or gallops,no edema  Respiratory:  clear to auscultation bilaterally, normal work of breathing GI: soft, nontender, nondistended, + BS MS: no deformity or atrophy  Skin: warm and dry, no rash Neuro:  Strength and sensation are intact Psych: euthymic mood, full affect   EKG:   The ekg ordered today demonstrates AFib, controlled V rate   Recent Labs: No results found for requested labs within last 8760 hours.   Lipid Panel No results found for: CHOL, TRIG, HDL, CHOLHDL, VLDL, LDLCALC, LDLDIRECT   Other studies Reviewed: Additional studies/ records that were reviewed today with results demonstrating: labs reviewed.   ASSESSMENT AND PLAN:  1. Atrial fibrillation: Start metoprolol as Dr. Philip Aspen prescribed.  Check echocardiogram. Plan for DCCV in 4 weeks after he has started anticoagulation.  All questions answered. I instructed him to start today.  He will need TSH.  I will check with Dr. Philip Aspen if this was checked.   2. Hyperlipidemia: Taking red yeast rice.  Considering Bempedoic acid with Dr. Philip Aspen.  He is not interested in [place]. LDL 172 in 5/22.  I think red yeast rice is not helping much.  He needs lipid lowering therapy but doesn't believe in statins.  3. Obstructive sleep apnea: Not using CPAP. THis could be a risk factor for his AFib.  He is going to try an oral device that is specially made.  4. AAA: 3.0 cm noted from outside CT scan.   This patients CHA2DS2-VASc Score and unadjusted Ischemic Stroke Rate (% per year) is equal to 2.2 % stroke rate/year from a score of 2  Above score calculated as 1 point each if present [CHF, HTN, DM,  Vascular=MI/PAD/Aortic Plaque, Age if 65-74, or Male] Above score calculated as 2 points each if present [Age > 75, or Stroke/TIA/TE]      Current medicines are reviewed at length with the patient today.  The patient concerns regarding his medicines were addressed.  The following  changes have been made:  Start the metoprolol and Eliquis.  Labs/ tests ordered today include:   Orders Placed This Encounter  Procedures  . EKG 12-Lead    Recommend 150 minutes/week of aerobic exercise Low fat, low carb, high fiber diet recommended  Disposition:   FU in 6 weeks   Signed, Larae Grooms, MD  01/20/2021 10:42 AM    [facility] [place], [address], [place]  [address] Phone: [phone]; Fax: [phone]

## 2021-01-23 DIAGNOSIS — G4733 Obstructive sleep apnea (adult) (pediatric): Secondary | ICD-10-CM | POA: Diagnosis not present

## 2021-01-26 DIAGNOSIS — J3089 Other allergic rhinitis: Secondary | ICD-10-CM | POA: Diagnosis not present

## 2021-01-26 DIAGNOSIS — J301 Allergic rhinitis due to pollen: Secondary | ICD-10-CM | POA: Diagnosis not present

## 2021-01-26 DIAGNOSIS — J3081 Allergic rhinitis due to animal (cat) (dog) hair and dander: Secondary | ICD-10-CM | POA: Diagnosis not present

## 2021-01-30 DIAGNOSIS — M0579 Rheumatoid arthritis with rheumatoid factor of multiple sites without organ or systems involvement: Secondary | ICD-10-CM | POA: Diagnosis not present

## 2021-02-01 DIAGNOSIS — M25562 Pain in left knee: Secondary | ICD-10-CM | POA: Diagnosis not present

## 2021-02-01 DIAGNOSIS — J301 Allergic rhinitis due to pollen: Secondary | ICD-10-CM | POA: Diagnosis not present

## 2021-02-01 DIAGNOSIS — J3081 Allergic rhinitis due to animal (cat) (dog) hair and dander: Secondary | ICD-10-CM | POA: Diagnosis not present

## 2021-02-01 DIAGNOSIS — J3089 Other allergic rhinitis: Secondary | ICD-10-CM | POA: Diagnosis not present

## 2021-02-09 DIAGNOSIS — J3089 Other allergic rhinitis: Secondary | ICD-10-CM | POA: Diagnosis not present

## 2021-02-09 DIAGNOSIS — J3081 Allergic rhinitis due to animal (cat) (dog) hair and dander: Secondary | ICD-10-CM | POA: Diagnosis not present

## 2021-02-09 DIAGNOSIS — J301 Allergic rhinitis due to pollen: Secondary | ICD-10-CM | POA: Diagnosis not present

## 2021-02-11 ENCOUNTER — Other Ambulatory Visit: Payer: Self-pay

## 2021-02-11 DIAGNOSIS — I714 Abdominal aortic aneurysm, without rupture, unspecified: Secondary | ICD-10-CM

## 2021-02-13 ENCOUNTER — Other Ambulatory Visit: Payer: Self-pay

## 2021-02-13 ENCOUNTER — Other Ambulatory Visit: Payer: Medicare Other | Admitting: *Deleted

## 2021-02-13 ENCOUNTER — Ambulatory Visit (HOSPITAL_COMMUNITY): Payer: Medicare Other | Attending: Internal Medicine

## 2021-02-13 DIAGNOSIS — I4891 Unspecified atrial fibrillation: Secondary | ICD-10-CM

## 2021-02-13 LAB — BASIC METABOLIC PANEL
BUN/Creatinine Ratio: 31 — ABNORMAL HIGH (ref 10–24)
BUN: 26 mg/dL (ref 8–27)
CO2: 24 mmol/L (ref 20–29)
Calcium: 10.3 mg/dL — ABNORMAL HIGH (ref 8.6–10.2)
Chloride: 100 mmol/L (ref 96–106)
Creatinine, Ser: 0.85 mg/dL (ref 0.76–1.27)
Glucose: 106 mg/dL — ABNORMAL HIGH (ref 65–99)
Potassium: 4.3 mmol/L (ref 3.5–5.2)
Sodium: 139 mmol/L (ref 134–144)
eGFR: 95 mL/min/{1.73_m2} (ref >59)

## 2021-02-13 LAB — ECHOCARDIOGRAM COMPLETE
MV M vel: 5.16 m/s
MV Peak grad: 106.6 mmHg
Radius: 0.5 cm
S' Lateral: 3.3 cm

## 2021-02-13 MED ORDER — PERFLUTREN LIPID MICROSPHERE
1.0000 mL | INTRAVENOUS | Status: AC | PRN
  Administered 2021-02-13: 2 mL via INTRAVENOUS

## 2021-02-14 ENCOUNTER — Telehealth: Payer: Self-pay | Admitting: Interventional Cardiology

## 2021-02-14 NOTE — Telephone Encounter (Signed)
Patient states he will be unavailable for the next 4-5 hours. He requested a call back after 5:00 PM if possible.

## 2021-02-14 NOTE — Telephone Encounter (Signed)
Left message to call office

## 2021-02-14 NOTE — Telephone Encounter (Signed)
Patient is returning call to discuss lab results. 

## 2021-02-15 NOTE — Telephone Encounter (Signed)
I spoke with patient and reviewed echo and lab results with him

## 2021-02-16 DIAGNOSIS — J3089 Other allergic rhinitis: Secondary | ICD-10-CM | POA: Diagnosis not present

## 2021-02-16 DIAGNOSIS — J301 Allergic rhinitis due to pollen: Secondary | ICD-10-CM | POA: Diagnosis not present

## 2021-02-16 DIAGNOSIS — J3081 Allergic rhinitis due to animal (cat) (dog) hair and dander: Secondary | ICD-10-CM | POA: Diagnosis not present

## 2021-02-17 ENCOUNTER — Other Ambulatory Visit (HOSPITAL_COMMUNITY): Payer: Medicare Other

## 2021-02-20 ENCOUNTER — Ambulatory Visit (HOSPITAL_COMMUNITY): Payer: Medicare Other | Admitting: Anesthesiology

## 2021-02-20 ENCOUNTER — Ambulatory Visit (HOSPITAL_COMMUNITY)
Admission: RE | Admit: 2021-02-20 | Discharge: 2021-02-20 | Disposition: A | Discharge status: Home / Self Care | Payer: Medicare Other | Attending: Cardiology | Admitting: Cardiology

## 2021-02-20 ENCOUNTER — Encounter (HOSPITAL_COMMUNITY): Payer: Self-pay | Admitting: Cardiology

## 2021-02-20 ENCOUNTER — Other Ambulatory Visit: Payer: Self-pay

## 2021-02-20 ENCOUNTER — Encounter (HOSPITAL_COMMUNITY)
Admission: RE | Disposition: A | Discharge status: Home / Self Care | Payer: Self-pay | Source: Home / Self Care | Attending: Cardiology

## 2021-02-20 DIAGNOSIS — Z7901 Long term (current) use of anticoagulants: Secondary | ICD-10-CM | POA: Insufficient documentation

## 2021-02-20 DIAGNOSIS — Z79899 Other long term (current) drug therapy: Secondary | ICD-10-CM | POA: Insufficient documentation

## 2021-02-20 DIAGNOSIS — I714 Abdominal aortic aneurysm, without rupture: Secondary | ICD-10-CM | POA: Diagnosis not present

## 2021-02-20 DIAGNOSIS — Z881 Allergy status to other antibiotic agents status: Secondary | ICD-10-CM | POA: Diagnosis not present

## 2021-02-20 DIAGNOSIS — M5136 Other intervertebral disc degeneration, lumbar region: Secondary | ICD-10-CM | POA: Diagnosis not present

## 2021-02-20 DIAGNOSIS — Z801 Family history of malignant neoplasm of trachea, bronchus and lung: Secondary | ICD-10-CM | POA: Insufficient documentation

## 2021-02-20 DIAGNOSIS — I4891 Unspecified atrial fibrillation: Secondary | ICD-10-CM | POA: Diagnosis not present

## 2021-02-20 DIAGNOSIS — Z882 Allergy status to sulfonamides status: Secondary | ICD-10-CM | POA: Insufficient documentation

## 2021-02-20 DIAGNOSIS — I4819 Other persistent atrial fibrillation: Secondary | ICD-10-CM | POA: Diagnosis not present

## 2021-02-20 DIAGNOSIS — E213 Hyperparathyroidism, unspecified: Secondary | ICD-10-CM | POA: Diagnosis not present

## 2021-02-20 HISTORY — PX: CARDIOVERSION: SHX1299

## 2021-02-20 SURGERY — CARDIOVERSION
Anesthesia: General

## 2021-02-20 MED ORDER — LIDOCAINE 2% (20 MG/ML) 5 ML SYRINGE
INTRAMUSCULAR | Status: DC | PRN
  Administered 2021-02-20: 60 mg via INTRAVENOUS

## 2021-02-20 MED ORDER — PROPOFOL 10 MG/ML IV BOLUS
INTRAVENOUS | Status: DC | PRN
  Administered 2021-02-20: 50 mg via INTRAVENOUS
  Administered 2021-02-20: 80 mg via INTRAVENOUS

## 2021-02-20 NOTE — Anesthesia Preprocedure Evaluation (Addendum)
Anesthesia Evaluation  Patient identified by MRN, date of birth, ID band Patient awake    Reviewed: Allergy & Precautions, NPO status , Patient's Chart, lab work & pertinent test results  History of Anesthesia Complications Negative for: history of anesthetic complications  Airway Mallampati: I  TM Distance: >3 FB Neck ROM: Full    Dental  (+) Teeth Intact   Pulmonary neg shortness of breath, neg sleep apnea, neg COPD, neg recent URI, former smoker,    Pulmonary exam normal breath sounds clear to auscultation       Cardiovascular negative cardio ROS Normal cardiovascular exam Rhythm:Regular  Abdominal aortic aneurysm  ECHO 6/22 1. The mitral valve is grossly normal. Moderate mitral valve  regurgitation- mechanism appears to be atrial functional; there is  systolic blunting of the pulmonary vein flow. No evidence of mitral  stenosis.  2. Left atrial size was severely dilated.  3. Left ventricular ejection fraction, by estimation, is 50 to 55%. The  left ventricle has low normal function. The left ventricle has no regional  wall motion abnormalities. Left ventricular diastolic parameters are  indeterminate.  4. Right ventricular systolic function is normal. The right ventricular  size is normal. Tricuspid regurgitation signal is inadequate for assessing  PA pressure.  5. Right atrial size was mildly dilated.  6. The aortic valve is tricuspid. There is mild calcification of the  aortic valve. There is mild thickening of the aortic valve. Aortic valve  regurgitation is not visualized. No aortic stenosis is present.  7. Aortic dilatation noted. There is mild dilatation of the aortic root,  measuring 41 mm.  8. The inferior vena cava is normal in size with greater than 50%  respiratory variability, suggesting right atrial pressure of 3 mmHg.    Neuro/Psych negative neurological ROS  negative psych ROS    GI/Hepatic negative GI ROS, Neg liver ROS,   Endo/Other  negative endocrine ROS  Renal/GU negative Renal ROS     Musculoskeletal  (+) Arthritis , Rheumatoid disorders,    Abdominal Normal abdominal exam  (+)   Peds  Hematology negative hematology ROS (+)   Anesthesia Other Findings   Reproductive/Obstetrics                            Anesthesia Physical  Anesthesia Plan  ASA: 2  Anesthesia Plan: General   Post-op Pain Management:    Induction: Intravenous  PONV Risk Score and Plan: 2  Airway Management Planned: Natural Airway and Mask  Additional Equipment: None  Intra-op Plan:   Post-operative Plan:   Informed Consent: I have reviewed the patients History and Physical, chart, labs and discussed the procedure including the risks, benefits and alternatives for the proposed anesthesia with the patient or authorized representative who has indicated his/her understanding and acceptance.     Dental advisory given  Plan Discussed with: CRNA and Anesthesiologist  Anesthesia Plan Comments:         Anesthesia Quick Evaluation

## 2021-02-20 NOTE — Anesthesia Procedure Notes (Signed)
Procedure Name: General with mask airway Date/Time: 02/20/2021 9:48 AM Performed by: Janace Litten, CRNA Pre-anesthesia Checklist: Patient identified, Emergency Drugs available, Suction available, Patient being monitored and Timeout performed Patient Re-evaluated:Patient Re-evaluated prior to induction Oxygen Delivery Method: Ambu bag Preoxygenation: Pre-oxygenation with 100% oxygen Induction Type: IV induction Ventilation: Mask ventilation without difficulty

## 2021-02-20 NOTE — Transfer of Care (Signed)
Immediate Anesthesia Transfer of Care Note  Patient: Frederick Bridges  Procedure(s) Performed: CARDIOVERSION  Patient Location: PACU and Endoscopy Unit  Anesthesia Type:General  Level of Consciousness: drowsy, patient cooperative and responds to stimulation  Airway & Oxygen Therapy: Patient Spontanous Breathing  Post-op Assessment: Report given to RN and Post -op Vital signs reviewed and stable  Post vital signs: Reviewed and stable  Last Vitals:  Vitals Value Taken Time  BP    Temp    Pulse    Resp    SpO2      Last Pain:  Vitals:   02/20/21 0909  TempSrc: Oral  PainSc: 0-No pain         Complications: No notable events documented.

## 2021-02-20 NOTE — CV Procedure (Signed)
   Electrical Cardioversion Procedure Note Frederick Bridges [id number] Jan 30, 1951  Procedure: Electrical Cardioversion Indications:  Atrial Fibrillation  Time Out: Verified patient identification, verified procedure,medications/allergies/relevent history reviewed, required imaging and test results available.  Performed  Procedure Details  The patient was NPO after midnight. Anesthesia was administered at the beside  by Dr.Oddono with 130mg  of propofol and 60mg  Lidocaine.  Cardioversion was done with synchronized biphasic defibrillation with AP pads with 150watts.  The patient  failed to convert to normal sinus rhythm. Cardioversion was done with synchronized biphasic defibrillation with AP pads with 200watts.  The patient  failed to convert to normal sinus rhythm. The patient tolerated the procedure well .  IMPRESSION:  Unsuccessful cardioversion of atrial fibrillation  Patient will be seen back by Dr. [name] for further management of atrial fibrillation with possible antiarrhythmic drug therapy.     Frederick Bridges 02/20/2021, 9:56 AM

## 2021-02-20 NOTE — Anesthesia Postprocedure Evaluation (Signed)
Anesthesia Post Note  Patient: Frederick Bridges  Procedure(s) Performed: CARDIOVERSION     Patient location during evaluation: PACU Anesthesia Type: General Level of consciousness: awake and alert Pain management: pain level controlled Vital Signs Assessment: post-procedure vital signs reviewed and stable Respiratory status: spontaneous breathing, nonlabored ventilation, respiratory function stable and patient connected to nasal cannula oxygen Cardiovascular status: blood pressure returned to baseline and stable Postop Assessment: no apparent nausea or vomiting Anesthetic complications: no   No notable events documented.  Last Vitals:  Vitals:   02/20/21 1009 02/20/21 1019  BP: 132/85 122/87  Pulse: 85 84  Resp: 16 (!) 22  Temp:    SpO2: 95% 98%    Last Pain:  Vitals:   02/20/21 1019  TempSrc:   PainSc: 0-No pain                 Wahid Holley

## 2021-02-20 NOTE — H&P (Signed)
Cardiology H&P     Date: 02/20/2021   ID:  Frederick Bridges, DOB 03-19-1951, MRN [id number]   PCP:  [name], MD        YS:[id number] fibrillation      Wt Readings from Last 3 Encounters:  01/20/21 217 lb 9.6 oz (98.7 kg)  02/10/20 216 lb 8 oz (98.2 kg)  09/17/19 215 lb (97.5 kg)        History of Present Illness: Frederick Bridges is a 70 y.o. male who was seen in fMay by Dr. [name] for the evaluation of atrial fibrillation at the request of [name], MD.   He had a UTI in 12/21.  He reacted to two different antibiotics.     He had fluid on his knee in Jan 2022.  DVT was ruled out.  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 He got a cortisone shot- Dr. Trudie Reed. THen he went to Dr. Maureen Ralphs. 25 cc fluid drained, and more cortisone given. Knee is better.   Dr. [name] detected AFib on 01/16/21.  He was then evaluated by Dr. [name] and was placed on Eliquis 5mg  BID.   He is now referred for DCCV after 4 weeks of anticoagulation. 2D echo showed normal LVF with EF 50-55% with severe LAE, mild RAE, moderate MR        Past Medical History:  Diagnosis Date   A-fib [facility])     AAA (abdominal aortic aneurysm) (HCC)     Allergy      hayfever   Colon polyps     Hay fever     Nocturia     Personal history of colonic adenomas 12/22/2012   Rheumatoid arthritis ([place])     Wears glasses             Past Surgical History:  Procedure Laterality Date   COLONOSCOPY   multiple   INGUINAL HERNIA REPAIR       PARATHYROIDECTOMY N/A 09/02/2015    Procedure: PARATHYROIDECTOMY;  Surgeon: Armandina Gemma, MD;  Location: [place];  Service: General;  Laterality: N/A;   TONSILLECTOMY        as a child              Current Outpatient Medications  Medication Sig Dispense Refill   amoxicillin-clavulanate (AUGMENTIN) 875-125 MG tablet Take 1 tablet by mouth 2 (two) times daily.       ascorbic acid (VITAMIN C) 100 MG tablet Take by mouth.       Azelastine HCl 0.15 % SOLN Place 2 sprays into both  nostrils 2 (two) times daily.       cetirizine (ZYRTEC) 10 MG tablet 1 tablet       Cholecalciferol (VITAMIN D3 PO) Take 5,000 Units by mouth daily.       co-enzyme Q-10 30 MG capsule Take by mouth.       Cyanocobalamin (B-12 PO) Take 5,000 mg by mouth daily.       ELIQUIS 5 MG TABS tablet Take 1 tablet by mouth 2 (two) times daily.       EPINEPHrine 0.3 mg/0.3 mL IJ SOAJ injection See admin instructions.       fexofenadine (ALLEGRA) 180 MG tablet 1 tablet       glucose 4 GM chewable tablet Chew by mouth.       golimumab (SIMPONI ARIA) 50 MG/4ML SOLN injection every 8 (eight) weeks.       Magnesium 100 MG CAPS Take 800 mg by mouth.  metoprolol succinate (TOPROL-XL) 25 MG 24 hr tablet Take 1 tablet by mouth daily.       Multiple Vitamin (MULTI VITAMIN MENS PO) Take 1 tablet by mouth daily.       naproxen sodium (ALEVE) 220 MG tablet Take by mouth.       Red Yeast Rice Extract (RED YEAST RICE PO) Take 600 mg by mouth daily.       tamsulosin (FLOMAX) 0.4 MG CAPS capsule Take 0.4 mg by mouth daily.       Turmeric (QC TUMERIC COMPLEX PO) Take 1 capsule by mouth daily.       Ubiquinol 100 MG CAPS Take 1 Dose by mouth daily.       UNABLE TO FIND Take 125 mg by mouth daily. Med Name: tocotriends       zinc gluconate 50 MG tablet Take 50 mg by mouth daily.        No current facility-administered medications for this visit.      Allergies:   Ciprofloxacin, Other, and Sulfa antibiotics      Social History:  The patient  reports that he quit smoking about 12 years ago. His smoking use included cigarettes. He has never used smokeless tobacco. He reports current alcohol use of about 12.0 standard drinks of alcohol per week. He reports that he does not use drugs.    Family History:  The patient's family history includes Lung cancer in his brother and mother.      ROS:  Please see the history of present illness.   Otherwise, review of systems are positive for not wanting to take statins.   All  other systems are reviewed and negative.     PHYSICAL EXAM: GEN: Well nourished, well developed in no acute distress HEENT: Normal NECK: No JVD; No carotid bruits LYMPHATICS: No lymphadenopathy CARDIAC:irregularly irregular, no murmurs, rubs, gallops RESPIRATORY:  Clear to auscultation without rales, wheezing or rhonchi  ABDOMEN: Soft, non-tender, non-distended MUSCULOSKELETAL:  No edema; No deformity  SKIN: Warm and dry NEUROLOGIC:  Alert and oriented x 3 PSYCHIATRIC:  Normal affect      EKG:   none     Recent Labs: No results found for requested labs within last 8760 hours.    Lipid Panel No results found for: CHOL, TRIG, HDL, CHOLHDL, VLDL, LDLCALC, LDLDIRECT   Other studies Reviewed: Additional studies/ records that were reviewed today with results demonstrating: labs reviewed.     ASSESSMENT AND PLAN:    Persistent atrial fibrillation -he continues to remain in atrial fibrillation -he has been on anticoagulation with Eliquis 5mg  BID for 4 weeks with no missed doses -plan for DCCV today -continue Lopressor -The risks (stroke, cardiac arrhythmias rarely resulting in the need for a temporary or permanent pacemaker, skin irritation or burns and complications associated with conscious sedation including aspiration, arrhythmia, respiratory failure and death), benefits (restoration of normal sinus rhythm) and alternatives of a direct current cardioversion were explained in detail to Mr. Gruenhagen and he agrees to proceed.     This patients CHA2DS2-VASc Score and unadjusted Ischemic Stroke Rate (% per year) is equal to 2.2 % stroke rate/year from a score of 2   Above score calculated as 1 point each if present [CHF, HTN, DM, Vascular=MI/PAD/Aortic Plaque, Age if 65-74, or Male] Above score calculated as 2 points each if present [Age > 75, or Stroke/TIA/TE]

## 2021-02-20 NOTE — Discharge Instructions (Signed)

## 2021-02-21 ENCOUNTER — Telehealth: Payer: Self-pay

## 2021-02-21 DIAGNOSIS — I4891 Unspecified atrial fibrillation: Secondary | ICD-10-CM

## 2021-02-21 DIAGNOSIS — M1712 Unilateral primary osteoarthritis, left knee: Secondary | ICD-10-CM | POA: Diagnosis not present

## 2021-02-21 NOTE — Telephone Encounter (Signed)
Jettie Booze, MD  Thompson Grayer, RN; P [facility] Triage Can we get him seen in AFib clinic.  Continue anticoagulation.   JV         Previous Messages    ----- Message -----  From: Sueanne Margarita, MD  Sent: 02/20/2021   9:59 AM EDT  To: Jettie Booze, MD  Subject: Inpatient Notes                                 Ulice Dash this patient did not cardiovert so will need AADT.  Can you get him back into see you or afib clinic?     Per Dr. [name], scheduled the patient in the Afib Clinic this Friday. MyChart message sent with directions. He was grateful for call and agrees with plan.

## 2021-02-22 ENCOUNTER — Encounter (HOSPITAL_COMMUNITY): Payer: Self-pay | Admitting: Cardiology

## 2021-02-22 DIAGNOSIS — J301 Allergic rhinitis due to pollen: Secondary | ICD-10-CM | POA: Diagnosis not present

## 2021-02-22 DIAGNOSIS — J3081 Allergic rhinitis due to animal (cat) (dog) hair and dander: Secondary | ICD-10-CM | POA: Diagnosis not present

## 2021-02-22 DIAGNOSIS — J3089 Other allergic rhinitis: Secondary | ICD-10-CM | POA: Diagnosis not present

## 2021-02-24 ENCOUNTER — Ambulatory Visit (HOSPITAL_COMMUNITY)
Admission: RE | Admit: 2021-02-24 | Discharge: 2021-02-24 | Disposition: A | Discharge status: Home / Self Care | Payer: Medicare Other | Source: Ambulatory Visit | Attending: Physician Assistant | Admitting: Physician Assistant

## 2021-02-24 ENCOUNTER — Encounter (HOSPITAL_COMMUNITY): Payer: Self-pay | Admitting: Physician Assistant

## 2021-02-24 ENCOUNTER — Other Ambulatory Visit: Payer: Self-pay

## 2021-02-24 VITALS — BP 144/98 | HR 77 | Ht 70.0 in | Wt 217.2 lb

## 2021-02-24 DIAGNOSIS — G4733 Obstructive sleep apnea (adult) (pediatric): Secondary | ICD-10-CM | POA: Diagnosis not present

## 2021-02-24 DIAGNOSIS — I4819 Other persistent atrial fibrillation: Secondary | ICD-10-CM | POA: Diagnosis not present

## 2021-02-24 DIAGNOSIS — Z6831 Body mass index (BMI) 31.0-31.9, adult: Secondary | ICD-10-CM | POA: Diagnosis not present

## 2021-02-24 DIAGNOSIS — Z79899 Other long term (current) drug therapy: Secondary | ICD-10-CM | POA: Insufficient documentation

## 2021-02-24 DIAGNOSIS — M069 Rheumatoid arthritis, unspecified: Secondary | ICD-10-CM | POA: Diagnosis not present

## 2021-02-24 DIAGNOSIS — Z7901 Long term (current) use of anticoagulants: Secondary | ICD-10-CM | POA: Diagnosis not present

## 2021-02-24 DIAGNOSIS — Z87891 Personal history of nicotine dependence: Secondary | ICD-10-CM | POA: Insufficient documentation

## 2021-02-24 DIAGNOSIS — Z8679 Personal history of other diseases of the circulatory system: Secondary | ICD-10-CM | POA: Insufficient documentation

## 2021-02-24 DIAGNOSIS — E669 Obesity, unspecified: Secondary | ICD-10-CM | POA: Insufficient documentation

## 2021-02-24 MED ORDER — FLECAINIDE ACETATE 50 MG PO TABS: 50.0000 mg | ORAL_TABLET | Freq: Two times a day (BID) | ORAL | 3 refills | Status: DC

## 2021-02-24 NOTE — Progress Notes (Signed)
Primary Care Physician: [name], MD Primary Cardiologist: Dr [name]  Primary Electrophysiologist: none Referring Physician: Dr Gwynneth Macleod is a 70 y.o. male with a history of AAA, RA, OSA, and atrial fibrillation who presents for consultation in the [facility].  The patient was initially diagnosed with atrial fibrillation 01/2021 after presenting to his PCP for routine follow up. He is unaware of his arrhythmia and rate controlled. Patient is on Eliquis for a CHADS2VASC score of 1. He underwent DCCV on 02/20/21 which was unsuccessful. He does consume alcohol. He has an oral device for his OSA and is compliant. He denies any bleeding issues on anticoagulation.   Today, he denies symptoms of palpitations, chest pain, shortness of breath, orthopnea, PND, lower extremity edema, dizziness, presyncope, syncope, bleeding, or neurologic sequela. The patient is tolerating medications without difficulties and is otherwise without complaint today.    Atrial Fibrillation Risk Factors:  he does have symptoms or diagnosis of sleep apnea. he is compliant with oral device for OSA. he does not have a history of rheumatic fever. he does have a history of alcohol use. The patient does not have a history of early familial atrial fibrillation or other arrhythmias.  he has a BMI of Body mass index is 31.16 kg/m.Marland Kitchen Filed Weights   02/24/21 0828  Weight: 98.5 kg    Family History  Problem Relation Age of Onset   Lung cancer Mother    Lung cancer Brother    Colon cancer Neg Hx    Pancreatic cancer Neg Hx    Rectal cancer Neg Hx    Stomach cancer Neg Hx    Colon polyps Neg Hx    Esophageal cancer Neg Hx      Atrial Fibrillation Management history:  Previous antiarrhythmic drugs: none Previous cardioversions: 02/20/21 Previous ablations: none CHADS2VASC score: 1 Anticoagulation history: Eliquis   Past Medical History:  Diagnosis Date    A-fib ([place])    AAA (abdominal aortic aneurysm) (HCC)    Allergy    hayfever   Colon polyps    Hay fever    Nocturia    Personal history of colonic adenomas 12/22/2012   Rheumatoid arthritis ([place])    Wears glasses    Past Surgical History:  Procedure Laterality Date   CARDIOVERSION N/A 02/20/2021   Procedure: CARDIOVERSION;  Surgeon: Sueanne Margarita, MD;  Location: [place];  Service: Cardiovascular;  Laterality: N/A;   COLONOSCOPY  multiple   INGUINAL HERNIA REPAIR     PARATHYROIDECTOMY N/A 09/02/2015   Procedure: PARATHYROIDECTOMY;  Surgeon: Armandina Gemma, MD;  Location: [facility];  Service: General;  Laterality: N/A;   TONSILLECTOMY     as a child    Current Outpatient Medications  Medication Sig Dispense Refill   Ascorbic Acid (VITAMIN C) 1000 MG tablet Take 1,000 mg by mouth in the morning.     Azelastine HCl 0.15 % SOLN Place 2 sprays into both nostrils at bedtime.     cetirizine (ZYRTEC) 10 MG tablet Take 10 mg by mouth every evening.     CHELATED MAGNESIUM PO Take 200 mg by mouth in the morning and at bedtime.     Cholecalciferol (VITAMIN D-3) 125 MCG (5000 UT) TABS Take 5,000 Units by mouth every evening.     ELIQUIS 5 MG TABS tablet Take 5 mg by mouth 2 (two) times daily.     EPINEPHrine 0.3 mg/0.3 mL IJ SOAJ injection Inject 0.3 mg into  the muscle as needed for anaphylaxis.     fexofenadine (ALLEGRA) 180 MG tablet Take 180 mg by mouth in the morning.     golimumab (SIMPONI ARIA) 50 MG/4ML SOLN injection Inject into the vein every 8 (eight) weeks.     Green Tea, Camellia sinensis, (GREEN TEA EXTRACT PO) Take 350 mg by mouth every evening.     Menatetrenone (VITAMIN K2) 100 MCG TABS Take 100 mcg by mouth every evening.     Methylcobalamin 5000 MCG TBDP Take 5,000 mcg by mouth in the morning.     metoprolol succinate (TOPROL-XL) 25 MG 24 hr tablet Take 25 mg by mouth in the morning.     Misc Natural Products (MIXED TOCOTRIENOLS W/ VITA E PO) Take 1 capsule by mouth in the  morning.     Misc Natural Products (PROSTATE HEALTH) CAPS Take 1 capsule by mouth at bedtime. Ultra Prostate Formula     Multiple Vitamin (MULTIVITAMIN WITH MINERALS) TABS tablet Take 1 tablet by mouth in the morning.     naproxen sodium (ALEVE) 220 MG tablet Take 660-880 mg by mouth daily as needed (arthritis pain).     NON FORMULARY Inject 1 Dose as directed once a week. Allergy shots     OVER THE COUNTER MEDICATION Take 1 capsule by mouth in the morning. [employer] Curaphen     Red Yeast Rice 600 MG CAPS Take 600 mg by mouth in the morning and at bedtime.     tamsulosin (FLOMAX) 0.4 MG CAPS capsule Take 0.4 mg by mouth at bedtime.     TOCOPHEROLS-TOCOTRIENOLS PO Take 1 capsule by mouth every evening.     Ubiquinol 100 MG CAPS Take 100 mg by mouth in the morning.     Zinc 50 MG TABS Take 50 mg by mouth every evening.     No current facility-administered medications for this encounter.    Allergies  Allergen Reactions   Prednisone     Other reaction(s): makes hyper/anxious   Ciprofloxacin Rash and Other (See Comments)    pain   Sulfa Antibiotics Rash    pain    Social History   Socioeconomic History   Marital status: Married    Spouse name: Not on file   Number of children: Not on file   Years of education: Not on file   Highest education level: Not on file  Occupational History   Occupation: retired    Fish farm manager: [employer]  Tobacco Use   Smoking status: Former    Pack years: 0.00    Types: Cigarettes    Quit date: 07/10/2008    Years since quitting: 12.6   Smokeless tobacco: Never  Vaping Use   Vaping Use: Never used  Substance and Sexual Activity   Alcohol use: Yes    Alcohol/week: 12.0 standard drinks    Types: 12 Cans of beer per week    Comment: 12 cans per week/1-5beers per sitting   Drug use: No   Sexual activity: Not on file  Other Topics Concern   Not on file  Social History Narrative   Left handed    Social Determinants of Health    Financial Resource Strain: Not on file  Food Insecurity: Not on file  Transportation Needs: Not on file  Physical Activity: Not on file  Stress: Not on file  Social Connections: Not on file  Intimate Partner Violence: Not on file     ROS- All systems are reviewed and negative except as per the  HPI above.  Physical Exam: Vitals:   02/24/21 0828  BP: (!) 144/98  Pulse: 77  Weight: 98.5 kg  Height: 5\' 10"  (1.778 m)    GEN- The patient is a well appearing obese male, alert and oriented x 3 today.   Head- normocephalic, atraumatic Eyes-  Sclera clear, conjunctiva pink Ears- hearing intact Oropharynx- clear Neck- supple  Lungs- Clear to ausculation bilaterally, normal work of breathing Heart- irregular rate and rhythm, no murmurs, rubs or gallops  GI- soft, NT, ND, + BS Extremities- no clubbing, cyanosis, or edema MS- no significant deformity or atrophy Skin- no rash or lesion Psych- euthymic mood, full affect Neuro- strength and sensation are intact  Wt Readings from Last 3 Encounters:  02/24/21 98.5 kg  02/20/21 97.5 kg  01/20/21 98.7 kg    EKG today demonstrates  Afib Vent. rate 77 BPM PR interval * ms QRS duration 86 ms QT/QTcB 354/400 ms  Echo 02/13/21 demonstrated  1. The mitral valve is grossly normal. Moderate mitral valve  regurgitation- mechanism appears to be atrial functional; there is  systolic blunting of the pulmonary vein flow. No evidence of mitral  stenosis.   2. Left atrial size was severely dilated.   3. Left ventricular ejection fraction, by estimation, is 50 to 55%. The  left ventricle has low normal function. The left ventricle has no regional  wall motion abnormalities. Left ventricular diastolic parameters are  indeterminate.   4. Right ventricular systolic function is normal. The right ventricular  size is normal. Tricuspid regurgitation signal is inadequate for assessing  PA pressure.   5. Right atrial size was mildly dilated.   6.  The aortic valve is tricuspid. There is mild calcification of the  aortic valve. There is mild thickening of the aortic valve. Aortic valve  regurgitation is not visualized. No aortic stenosis is present.   7. Aortic dilatation noted. There is mild dilatation of the aortic root,  measuring 41 mm.   8. The inferior vena cava is normal in size with greater than 50%  respiratory variability, suggesting right atrial pressure of 3 mmHg.   Epic records are reviewed at length today  CHA2DS2-VASc Score = 1  The patient's score is based upon: CHF History: No HTN History: No Diabetes History: No Stroke History: No Vascular Disease History: No Age Score: 1 Gender Score: 0     ASSESSMENT AND PLAN: 1. Persistent Atrial Fibrillation (ICD10:  I48.19) The patient's CHA2DS2-VASc score is 1, indicating a 0.6% annual risk of stroke.   S/p unsuccessful DCCV on 02/20/21 We discussed therapeutic options today including AAD and repeat DCCV.  Will start flecainide 50 mg BID. If he does not convert, will increase to 100 mg BID and arrange for DCCV on the medication. Unsure if he would be an ablation candidate with severely dilated LA.  Continue Eliquis 5 mg BID Continue Toprol 25 mg daily  2. Obesity Body mass index is 31.16 kg/m. Lifestyle modification was discussed at length including regular exercise and weight reduction.  3. Obstructive sleep apnea The importance of adequate treatment of sleep apnea was discussed today in order to improve our ability to maintain sinus rhythm long term. Patient is compliant with oral device.     Follow up in the AF clinic next week for ECG.    [facility] [address] [place], [place] [address] [phone] 02/24/2021 8:38 AM

## 2021-02-24 NOTE — Patient Instructions (Signed)
Start Flecainide 50mg twice a day 

## 2021-02-28 DIAGNOSIS — M1712 Unilateral primary osteoarthritis, left knee: Secondary | ICD-10-CM | POA: Diagnosis not present

## 2021-03-01 ENCOUNTER — Other Ambulatory Visit: Payer: Self-pay

## 2021-03-01 ENCOUNTER — Ambulatory Visit (HOSPITAL_COMMUNITY)
Admission: RE | Admit: 2021-03-01 | Discharge: 2021-03-01 | Disposition: A | Discharge status: Home / Self Care | Payer: Medicare Other | Source: Ambulatory Visit | Attending: Physician Assistant | Admitting: Physician Assistant

## 2021-03-01 VITALS — BP 152/100 | HR 94

## 2021-03-01 DIAGNOSIS — J3089 Other allergic rhinitis: Secondary | ICD-10-CM | POA: Diagnosis not present

## 2021-03-01 DIAGNOSIS — J3081 Allergic rhinitis due to animal (cat) (dog) hair and dander: Secondary | ICD-10-CM | POA: Diagnosis not present

## 2021-03-01 DIAGNOSIS — I4819 Other persistent atrial fibrillation: Secondary | ICD-10-CM | POA: Insufficient documentation

## 2021-03-01 DIAGNOSIS — J301 Allergic rhinitis due to pollen: Secondary | ICD-10-CM | POA: Diagnosis not present

## 2021-03-01 LAB — CBC
HCT: 46.8 % (ref 39.0–52.0)
Hemoglobin: 15.2 g/dL (ref 13.0–17.0)
MCH: 30 pg (ref 26.0–34.0)
MCHC: 32.5 g/dL (ref 30.0–36.0)
MCV: 92.3 fL (ref 80.0–100.0)
Platelets: 311 10*3/uL (ref 150–400)
RBC: 5.07 MIL/uL (ref 4.22–5.81)
RDW: 13.3 % (ref 11.5–15.5)
WBC: 10.1 10*3/uL (ref 4.0–10.5)
nRBC: 0 % (ref 0.0–0.2)

## 2021-03-01 LAB — BASIC METABOLIC PANEL
Anion gap: 7 (ref 5–15)
BUN: 17 mg/dL (ref 8–23)
CO2: 26 mmol/L (ref 22–32)
Calcium: 9.9 mg/dL (ref 8.9–10.3)
Chloride: 102 mmol/L (ref 98–111)
Creatinine, Ser: 0.88 mg/dL (ref 0.61–1.24)
GFR, Estimated: >60 mL/min (ref >60)
Glucose, Bld: 104 mg/dL — ABNORMAL HIGH (ref 70–99)
Potassium: 4.2 mmol/L (ref 3.5–5.1)
Sodium: 135 mmol/L (ref 135–145)

## 2021-03-01 MED ORDER — FLECAINIDE ACETATE 100 MG PO TABS: 100.0000 mg | ORAL_TABLET | Freq: Two times a day (BID) | ORAL | 3 refills | Status: DC

## 2021-03-01 NOTE — H&P (View-Only) (Signed)
Patient returns for ECG after starting flecainide. ECG shows afib HR 94, QRS 92, Qtc 427. Patient does not feel any different at this point. Will increase dose to 100 mg BID and arrange for DCCV. Check bmet/cbc today. F/u with Dr [name] as scheduled. AF clinic in 2 months, sooner if he fails flecainide after DCCV.

## 2021-03-01 NOTE — Progress Notes (Signed)
Patient returns for ECG after starting flecainide. ECG shows afib HR 94, QRS 92, Qtc 427. Patient does not feel any different at this point. Will increase dose to 100 mg BID and arrange for DCCV. Check bmet/cbc today. F/u with Dr [name] as scheduled. AF clinic in 2 months, sooner if he fails flecainide after DCCV.

## 2021-03-01 NOTE — Patient Instructions (Signed)
Increase flecainide to 100mg  twice a day     Cardioversion scheduled for Friday, July 1st  - Arrive at the [facility] and go to admitting at [facility] not eat or drink anything after midnight the night prior to your procedure.  - Take all your morning medication (except diabetic medications) with a sip of water prior to arrival.  - You will not be able to drive home after your procedure.  - Do NOT miss any doses of your blood thinner - if you should miss a dose please notify our office immediately.  - If you feel as if you go back into normal rhythm prior to scheduled cardioversion, please notify our office immediately. If your procedure is canceled in the cardioversion suite you will be charged a cancellation fee.   Patients will be asked to: to mask in public and hand hygiene (no longer quarantine) in the 3 days prior to surgery, to report if any COVID-19-like illness or household contacts to COVID-19 to determine need for testing

## 2021-03-02 DIAGNOSIS — Z6831 Body mass index (BMI) 31.0-31.9, adult: Secondary | ICD-10-CM | POA: Diagnosis not present

## 2021-03-02 DIAGNOSIS — I4891 Unspecified atrial fibrillation: Secondary | ICD-10-CM | POA: Diagnosis not present

## 2021-03-02 DIAGNOSIS — L409 Psoriasis, unspecified: Secondary | ICD-10-CM | POA: Diagnosis not present

## 2021-03-02 DIAGNOSIS — M0579 Rheumatoid arthritis with rheumatoid factor of multiple sites without organ or systems involvement: Secondary | ICD-10-CM | POA: Diagnosis not present

## 2021-03-02 DIAGNOSIS — M47816 Spondylosis without myelopathy or radiculopathy, lumbar region: Secondary | ICD-10-CM | POA: Diagnosis not present

## 2021-03-02 DIAGNOSIS — M25531 Pain in right wrist: Secondary | ICD-10-CM | POA: Diagnosis not present

## 2021-03-02 DIAGNOSIS — E669 Obesity, unspecified: Secondary | ICD-10-CM | POA: Diagnosis not present

## 2021-03-02 DIAGNOSIS — Z79899 Other long term (current) drug therapy: Secondary | ICD-10-CM | POA: Diagnosis not present

## 2021-03-07 DIAGNOSIS — M1712 Unilateral primary osteoarthritis, left knee: Secondary | ICD-10-CM | POA: Diagnosis not present

## 2021-03-08 DIAGNOSIS — J3089 Other allergic rhinitis: Secondary | ICD-10-CM | POA: Diagnosis not present

## 2021-03-08 DIAGNOSIS — J3081 Allergic rhinitis due to animal (cat) (dog) hair and dander: Secondary | ICD-10-CM | POA: Diagnosis not present

## 2021-03-08 DIAGNOSIS — J301 Allergic rhinitis due to pollen: Secondary | ICD-10-CM | POA: Diagnosis not present

## 2021-03-10 ENCOUNTER — Encounter (HOSPITAL_COMMUNITY)
Admission: RE | Disposition: A | Discharge status: Home / Self Care | Payer: Self-pay | Source: Home / Self Care | Attending: Cardiovascular Disease

## 2021-03-10 ENCOUNTER — Ambulatory Visit (HOSPITAL_COMMUNITY): Payer: Medicare Other | Admitting: Anesthesiology

## 2021-03-10 ENCOUNTER — Encounter (HOSPITAL_COMMUNITY): Payer: Self-pay | Admitting: Cardiovascular Disease

## 2021-03-10 ENCOUNTER — Other Ambulatory Visit: Payer: Self-pay

## 2021-03-10 ENCOUNTER — Ambulatory Visit (HOSPITAL_COMMUNITY)
Admission: RE | Admit: 2021-03-10 | Discharge: 2021-03-10 | Disposition: A | Discharge status: Home / Self Care | Payer: Medicare Other | Attending: Cardiovascular Disease | Admitting: Cardiovascular Disease

## 2021-03-10 DIAGNOSIS — Z888 Allergy status to other drugs, medicaments and biological substances status: Secondary | ICD-10-CM | POA: Insufficient documentation

## 2021-03-10 DIAGNOSIS — Z7901 Long term (current) use of anticoagulants: Secondary | ICD-10-CM | POA: Diagnosis not present

## 2021-03-10 DIAGNOSIS — I4819 Other persistent atrial fibrillation: Secondary | ICD-10-CM | POA: Insufficient documentation

## 2021-03-10 DIAGNOSIS — Z882 Allergy status to sulfonamides status: Secondary | ICD-10-CM | POA: Diagnosis not present

## 2021-03-10 DIAGNOSIS — M5136 Other intervertebral disc degeneration, lumbar region: Secondary | ICD-10-CM | POA: Diagnosis not present

## 2021-03-10 DIAGNOSIS — Z79899 Other long term (current) drug therapy: Secondary | ICD-10-CM | POA: Diagnosis not present

## 2021-03-10 DIAGNOSIS — Z881 Allergy status to other antibiotic agents status: Secondary | ICD-10-CM | POA: Diagnosis not present

## 2021-03-10 DIAGNOSIS — Z87891 Personal history of nicotine dependence: Secondary | ICD-10-CM | POA: Insufficient documentation

## 2021-03-10 DIAGNOSIS — I714 Abdominal aortic aneurysm, without rupture: Secondary | ICD-10-CM | POA: Diagnosis not present

## 2021-03-10 DIAGNOSIS — E210 Primary hyperparathyroidism: Secondary | ICD-10-CM | POA: Diagnosis not present

## 2021-03-10 HISTORY — PX: CARDIOVERSION: SHX1299

## 2021-03-10 HISTORY — DX: Sleep apnea, unspecified: G47.30

## 2021-03-10 SURGERY — CARDIOVERSION
Anesthesia: General

## 2021-03-10 MED ORDER — PROPOFOL 10 MG/ML IV BOLUS
INTRAVENOUS | Status: DC | PRN
  Administered 2021-03-10: 30 mg via INTRAVENOUS
  Administered 2021-03-10: 20 mg via INTRAVENOUS
  Administered 2021-03-10: 50 mg via INTRAVENOUS

## 2021-03-10 MED ORDER — LIDOCAINE 2% (20 MG/ML) 5 ML SYRINGE
INTRAMUSCULAR | Status: DC | PRN
  Administered 2021-03-10: 50 mg via INTRAVENOUS

## 2021-03-10 MED ORDER — SODIUM CHLORIDE 0.9 % IV SOLN: INTRAVENOUS | Status: DC | PRN

## 2021-03-10 NOTE — Interval H&P Note (Signed)
History and Physical Interval Note:  03/10/2021 12:07 PM  Frederick Bridges  has presented today for surgery, with the diagnosis of AFIB.  The various methods of treatment have been discussed with the patient and family. After consideration of risks, benefits and other options for treatment, the patient has consented to  Procedure(s): CARDIOVERSION (N/A) as a surgical intervention.  The patient's history has been reviewed, patient examined, no change in status, stable for surgery.  I have reviewed the patient's chart and labs.  Questions were answered to the patient's satisfaction.    NPO for DCCV. On eliquis. No missed doses >3 weeks.   [name], MD, [place]  [address] [place], [place] [address] [phone]  12:07 PM

## 2021-03-10 NOTE — Anesthesia Preprocedure Evaluation (Addendum)
Anesthesia Evaluation  Patient identified by MRN, date of birth, ID band Patient awake    Reviewed: Allergy & Precautions, NPO status , Patient's Chart, lab work & pertinent test results, reviewed documented beta blocker date and time   Airway Mallampati: II  TM Distance: >3 FB Neck ROM: Full    Dental no notable dental hx. (+) Teeth Intact, Dental Advisory Given   Pulmonary sleep apnea (uses dental device) , former smoker,    Pulmonary exam normal breath sounds clear to auscultation       Cardiovascular Normal cardiovascular exam+ dysrhythmias Atrial Fibrillation  Rhythm:Irregular Rate:Normal  Known AAA  TTE 2022 1. The mitral valve is grossly normal. Moderate mitral valve  regurgitation- mechanism appears to be atrial functional; there is  systolic blunting of the pulmonary vein flow. No evidence of mitral  stenosis.  2. Left atrial size was severely dilated.  3. Left ventricular ejection fraction, by estimation, is 50 to 55%. The  left ventricle has low normal function. The left ventricle has no regional  wall motion abnormalities. Left ventricular diastolic parameters are  indeterminate.  4. Right ventricular systolic function is normal. The right ventricular  size is normal. Tricuspid regurgitation signal is inadequate for assessing  PA pressure.  5. Right atrial size was mildly dilated.  6. The aortic valve is tricuspid. There is mild calcification of the  aortic valve. There is mild thickening of the aortic valve. Aortic valve  regurgitation is not visualized. No aortic stenosis is present.  7. Aortic dilatation noted. There is mild dilatation of the aortic root,  measuring 41 mm.  8. The inferior vena cava is normal in size with greater than 50%  respiratory variability, suggesting right atrial pressure of 3 mmHg.    Neuro/Psych negative neurological ROS  negative psych ROS   GI/Hepatic negative GI ROS, Neg  liver ROS,   Endo/Other  negative endocrine ROS  Renal/GU negative Renal ROS  negative genitourinary   Musculoskeletal  (+) Arthritis , Rheumatoid disorders,    Abdominal   Peds  Hematology  (+) Blood dyscrasia (on eliquis), ,   Anesthesia Other Findings   Reproductive/Obstetrics                            Anesthesia Physical Anesthesia Plan  ASA: 3  Anesthesia Plan: General   Post-op Pain Management:    Induction: Intravenous  PONV Risk Score and Plan: Propofol infusion and Treatment may vary due to age or medical condition  Airway Management Planned: Natural Airway  Additional Equipment:   Intra-op Plan:   Post-operative Plan:   Informed Consent: I have reviewed the patients History and Physical, chart, labs and discussed the procedure including the risks, benefits and alternatives for the proposed anesthesia with the patient or authorized representative who has indicated his/her understanding and acceptance.     Dental advisory given  Plan Discussed with: CRNA  Anesthesia Plan Comments:         Anesthesia Quick Evaluation

## 2021-03-10 NOTE — CV Procedure (Signed)
   DIRECT CURRENT CARDIOVERSION  NAME:  Frederick Bridges    MRN: [id number] DOB:  06-17-1951    ADMIT DATE: 03/10/2021  Indication:  Symptomatic atrial fibrillation  Procedure Note:  The patient signed informed consent.  They have had had therapeutic anticoagulation with eliquis greater than 3 weeks.  Anesthesia was administered by Dr. Lanetta Inch.  Adequate airway was maintained throughout and vital followed per protocol.  They were cardioverted x 3 with 200J of biphasic synchronized energy.  They converted to NSR.  There were no apparent complications.  The patient had normal neuro status and respiratory status post procedure with vitals stable as recorded elsewhere.    Follow up: They will continue on current medical therapy and follow up with cardiology as scheduled.  [name], MD, [facility]  [address] [place], [place] [address] [phone]  12:40 PM

## 2021-03-10 NOTE — Transfer of Care (Signed)
Immediate Anesthesia Transfer of Care Note  Patient: Frederick Bridges  Procedure(s) Performed: CARDIOVERSION  Patient Location: Endoscopy Unit  Anesthesia Type:General  Level of Consciousness: awake, oriented and patient cooperative  Airway & Oxygen Therapy: Patient Spontanous Breathing and Patient connected to nasal cannula oxygen  Post-op Assessment: Report given to RN and Post -op Vital signs reviewed and stable  Post vital signs: Reviewed  Last Vitals:  Vitals Value Taken Time  BP    Temp    Pulse    Resp    SpO2      Last Pain:  Vitals:   03/10/21 1118  TempSrc: Oral  PainSc: 0-No pain         Complications: No notable events documented.

## 2021-03-10 NOTE — Anesthesia Postprocedure Evaluation (Signed)
Anesthesia Post Note  Patient: Frederick Bridges  Procedure(s) Performed: CARDIOVERSION     Patient location during evaluation: Endoscopy Anesthesia Type: General Level of consciousness: awake and alert Pain management: pain level controlled Vital Signs Assessment: post-procedure vital signs reviewed and stable Respiratory status: spontaneous breathing, nonlabored ventilation, respiratory function stable and patient connected to nasal cannula oxygen Cardiovascular status: blood pressure returned to baseline and stable Postop Assessment: no apparent nausea or vomiting Anesthetic complications: no   No notable events documented.  Last Vitals:  Vitals:   03/10/21 1257 03/10/21 1309  BP: 116/80 128/81  Pulse: 87 90  Resp: (!) 21 (!) 23  Temp:    SpO2: 96% 97%    Last Pain:  Vitals:   03/10/21 1309  TempSrc:   PainSc: 0-No pain                 Gayathri Futrell L Jagjit Riner

## 2021-03-10 NOTE — Anesthesia Procedure Notes (Signed)
Procedure Name: General with mask airway Date/Time: 03/10/2021 12:30 PM Performed by: Jenne Campus, CRNA Pre-anesthesia Checklist: Patient identified, Emergency Drugs available, Suction available and Patient being monitored Oxygen Delivery Method: Ambu bag

## 2021-03-15 ENCOUNTER — Other Ambulatory Visit: Payer: Self-pay

## 2021-03-15 ENCOUNTER — Encounter: Payer: Self-pay | Admitting: Interventional Cardiology

## 2021-03-15 ENCOUNTER — Ambulatory Visit (INDEPENDENT_AMBULATORY_CARE_PROVIDER_SITE_OTHER): Payer: Medicare Other | Admitting: Interventional Cardiology

## 2021-03-15 VITALS — BP 120/68 | HR 61 | Ht 70.0 in | Wt 220.2 lb

## 2021-03-15 DIAGNOSIS — I714 Abdominal aortic aneurysm, without rupture, unspecified: Secondary | ICD-10-CM

## 2021-03-15 DIAGNOSIS — J3081 Allergic rhinitis due to animal (cat) (dog) hair and dander: Secondary | ICD-10-CM | POA: Diagnosis not present

## 2021-03-15 DIAGNOSIS — E782 Mixed hyperlipidemia: Secondary | ICD-10-CM

## 2021-03-15 DIAGNOSIS — J3089 Other allergic rhinitis: Secondary | ICD-10-CM | POA: Diagnosis not present

## 2021-03-15 DIAGNOSIS — I4819 Other persistent atrial fibrillation: Secondary | ICD-10-CM

## 2021-03-15 DIAGNOSIS — G4733 Obstructive sleep apnea (adult) (pediatric): Secondary | ICD-10-CM

## 2021-03-15 DIAGNOSIS — J301 Allergic rhinitis due to pollen: Secondary | ICD-10-CM | POA: Diagnosis not present

## 2021-03-15 NOTE — Progress Notes (Signed)
Cardiology Office Note   Date:  03/15/2021   ID:  Frederick Bridges, Frederick Bridges [birth year], MRN [id number]  PCP:  [name], MD    No chief complaint on file.  AFib  Wt Readings from Last 3 Encounters:  03/15/21 220 lb 3.2 oz (99.9 kg)  03/10/21 215 lb (97.5 kg)  02/24/21 217 lb 3.2 oz (98.5 kg)       History of Present Illness: Frederick Bridges is a 70 y.o. male  who I saw for atrial fibrillation at the request of [name], MD.   He had a UTI in 12/21.  He reacted to two different antibiotics.     He had fluid on his knee in Jan 2022.  DVT was ruled out.  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 He got a cortisone shot- Dr. Trudie Reed. THen he went to Dr. Maureen Ralphs. 25 cc fluid drained, and more cortisone given. Knee is better.   Dr. [name] detected AFib on 01/16/21.     He declined atorvastatin in the past.  Used Enbrel for RA.    Cardioversion on 7/1: "They were cardioverted x 3 with 200J of biphasic synchronized energy.  They converted to NSR."  Denies : Chest pain. Dizziness. Leg edema. Nitroglycerin use. Orthopnea. Palpitations. Paroxysmal nocturnal dyspnea. Shortness of breath. Syncope.    Past Medical History:  Diagnosis Date   A-fib [facility])    AAA (abdominal aortic aneurysm) (HCC)    Allergy    hayfever   Colon polyps    Hay fever    Nocturia    Personal history of colonic adenomas 12/22/2012   Rheumatoid arthritis ([place])    Sleep apnea    Dental Device   Wears glasses     Past Surgical History:  Procedure Laterality Date   CARDIOVERSION N/A 02/20/2021   Procedure: CARDIOVERSION;  Surgeon: Sueanne Margarita, MD;  Location: [facility];  Service: Cardiovascular;  Laterality: N/A;   CARDIOVERSION N/A 03/10/2021   Procedure: CARDIOVERSION;  Surgeon: Geralynn Rile, MD;  Location: [facility];  Service: Cardiovascular;  Laterality: N/A;   COLONOSCOPY  multiple   INGUINAL HERNIA REPAIR     PARATHYROIDECTOMY N/A 09/02/2015   Procedure: PARATHYROIDECTOMY;   Surgeon: Armandina Gemma, MD;  Location: [facility];  Service: General;  Laterality: N/A;   TONSILLECTOMY     as a child     Current Outpatient Medications  Medication Sig Dispense Refill   Ascorbic Acid (VITAMIN C) 1000 MG tablet Take 1,000 mg by mouth in the morning.     Azelastine HCl 0.15 % SOLN Place 2 sprays into both nostrils at bedtime.     cetirizine (ZYRTEC) 10 MG tablet Take 10 mg by mouth every evening.     CHELATED MAGNESIUM PO Take 100 mg by mouth in the morning and at bedtime.     Cholecalciferol (VITAMIN D-3) 125 MCG (5000 UT) TABS Take 5,000 Units by mouth every evening.     ELIQUIS 5 MG TABS tablet Take 5 mg by mouth 2 (two) times daily.     EPINEPHrine 0.3 mg/0.3 mL IJ SOAJ injection Inject 0.3 mg into the muscle as needed for anaphylaxis.     fexofenadine (ALLEGRA) 180 MG tablet Take 180 mg by mouth in the morning.     flecainide (TAMBOCOR) 100 MG tablet Take 1 tablet (100 mg total) by mouth 2 (two) times daily. 60 tablet 3   golimumab (SIMPONI ARIA) 50 MG/4ML SOLN injection Inject 50 mg into the vein every 8 (eight)  weeks.     [place] Cowden Tea, Camellia sinensis, (GREEN TEA EXTRACT PO) Take 350 mg by mouth every evening.     hydroxychloroquine (PLAQUENIL) 200 MG tablet Take 200 mg by mouth 2 (two) times daily.     Menatetrenone (VITAMIN K2) 100 MCG TABS Take 100 mcg by mouth every evening.     Methylcobalamin 5000 MCG TBDP Take 5,000 mcg by mouth in the morning.     metoprolol succinate (TOPROL-XL) 25 MG 24 hr tablet Take 25 mg by mouth in the morning.     Misc Natural Products (MIXED TOCOTRIENOLS W/ VITA E PO) Take 1 capsule by mouth in the morning.     Multiple Vitamin (MULTIVITAMIN WITH MINERALS) TABS tablet Take 1 tablet by mouth in the morning.     naproxen sodium (ALEVE) 220 MG tablet Take 660-880 mg by mouth daily as needed (arthritis pain).     NON FORMULARY Inject 1 Dose as directed once a week. Allergy shots     OVER THE COUNTER MEDICATION Take 1 capsule by mouth in the  morning. [employer] Curaphen     Red Yeast Rice 600 MG CAPS Take 600 mg by mouth in the morning and at bedtime.     tamsulosin (FLOMAX) 0.4 MG CAPS capsule Take 0.4 mg by mouth at bedtime.     TOCOPHEROLS-TOCOTRIENOLS PO Take 1 capsule by mouth every evening.     Ubiquinol 100 MG CAPS Take 100 mg by mouth in the morning.     Zinc 50 MG TABS Take 50 mg by mouth every evening.     No current facility-administered medications for this visit.    Allergies:   Prednisone, Ciprofloxacin, and Sulfa antibiotics    Social History:  The patient  reports that he quit smoking about 12 years ago. His smoking use included cigarettes. He has never used smokeless tobacco. He reports current alcohol use of about 12.0 standard drinks of alcohol per week. He reports that he does not use drugs.   Family History:  The patient's family history includes Lung cancer in his brother and mother.    ROS:  Please see the history of present illness.   Otherwise, review of systems are positive for left knee pain- uses Aleve.   All other systems are reviewed and negative.    PHYSICAL EXAM: VS:  BP 120/68   Pulse 61   Ht 5\' 10"  (1.778 m)   Wt 220 lb 3.2 oz (99.9 kg)   SpO2 93%   BMI 31.60 kg/m  , BMI Body mass index is 31.6 kg/m. GEN: Well nourished, well developed, in no acute distress HEENT: normal Neck: no JVD, carotid bruits, or masses Cardiac: RRR; no murmurs, rubs, or gallops,no edema  Respiratory:  clear to auscultation bilaterally, normal work of breathing GI: soft, nontender, nondistended, + BS MS: no deformity or atrophy Skin: warm and dry, no rash Neuro:  Strength and sensation are intact Psych: euthymic mood, full affect   EKG:   The ekg ordered today demonstrates NSR, no ST changes   Recent Labs: 03/01/2021: BUN 17; Creatinine, Ser 0.88; Hemoglobin 15.2; Platelets 311; Potassium 4.2; Sodium 135   Lipid Panel No results found for: CHOL, TRIG, HDL, CHOLHDL, VLDL, LDLCALC, LDLDIRECT    Other studies Reviewed: Additional studies/ records that were reviewed today with results demonstrating: hospital records reviewed.   ASSESSMENT AND PLAN:  AFib:  Back in NSR.  Continue flecainide.  WiIl check with PharmD about Plaquenil which he is starting for RA since  he is also on flecainide.   QT interval controlled on today's ECG.  Eliquis for stroke prevention, but this has been expensive.  Would also see if our pharmacist had an alternative. Hyperlipidemia: LDL 171.  He was not interested in statins or PCSK9 inhibitor.  He was considering bempedoic acid, but decided not to start it.  Taking red yeast. OSA:  trying an oral device to push out his jaw.  He feels a significant improvement.   AAA: 3.0 cm aorta noted on outside CT scan.    Current medicines are reviewed at length with the patient today.  The patient concerns regarding his medicines were addressed.  The following changes have been made:  No change  Labs/ tests ordered today include:  No orders of the defined types were placed in this encounter.   Recommend 150 minutes/week of aerobic exercise Low fat, low carb, high fiber diet recommended  Disposition:   FU in 3-4 months   Signed, Larae Grooms, MD  03/15/2021 4:24 PM    [facility] [address], [place], [place]  [address] Phone: [phone]; Fax: [phone]

## 2021-03-15 NOTE — Patient Instructions (Signed)
Medication Instructions:  Your physician recommends that you continue on your current medications as directed. Please refer to the Current Medication list given to you today.  *If you need a refill on your cardiac medications before your next appointment, please call your pharmacy*   Lab Work: none If you have labs (blood work) drawn today and your tests are completely normal, you will receive your results only by: [place] (if you have MyChart) OR A paper copy in the mail If you have any lab test that is abnormal or we need to change your treatment, we will call you to review the results.   Testing/Procedures: none   Follow-Up: At [facility], you and your health needs are our priority.  As part of our continuing mission to provide you with exceptional heart care, we have created designated Provider Care Teams.  These Care Teams include your primary Cardiologist (physician) and Advanced Practice Providers (APPs -  Physician Assistants and Nurse Practitioners) who all work together to provide you with the care you need, when you need it.  We recommend signing up for the patient portal called "MyChart".  Sign up information is provided on this After Visit Summary.  MyChart is used to connect with patients for Virtual Visits (Telemedicine).  Patients are able to view lab/test results, encounter notes, upcoming appointments, etc.  Non-urgent messages can be sent to your provider as well.   To learn more about what you can do with MyChart, go to [email or web address].    Your next appointment:   November 10,2022 at 10:20  The format for your next appointment:   In Person  Provider:   Dr [name]   Other Instructions

## 2021-03-15 NOTE — H&P (Signed)
Cardiology Admission History and Physical:  Patient ID: Frederick Bridges MRN: [id number] DOB: 08-Jan-1951  Admit date: 03/10/2021  Primary Care Provider: [name], MD Primary Cardiologist: None  Primary Electrophysiologist:  None   Chief Complaint:  atrial fibrillation  Patient Profile:  Frederick Bridges is a 70 y.o. male with history of atrial fibrillation who presents for DCCV. On eliquis >3 weeks and no missed doses. Most recent echo shows preserved LV function. Denies symptoms. NPO. History, allergies, reviewed.    Heart Pathway Score:       Past Medical History: Past Medical History:  Diagnosis Date   A-fib [facility])    AAA (abdominal aortic aneurysm) (HCC)    Allergy    hayfever   Colon polyps    Hay fever    Nocturia    Personal history of colonic adenomas 12/22/2012   Rheumatoid arthritis ([place])    Sleep apnea    Dental Device   Wears glasses     Past Surgical History: Past Surgical History:  Procedure Laterality Date   CARDIOVERSION N/A 02/20/2021   Procedure: CARDIOVERSION;  Surgeon: Sueanne Margarita, MD;  Location: [facility];  Service: Cardiovascular;  Laterality: N/A;   CARDIOVERSION N/A 03/10/2021   Procedure: CARDIOVERSION;  Surgeon: Geralynn Rile, MD;  Location: [facility];  Service: Cardiovascular;  Laterality: N/A;   COLONOSCOPY  multiple   INGUINAL HERNIA REPAIR     PARATHYROIDECTOMY N/A 09/02/2015   Procedure: PARATHYROIDECTOMY;  Surgeon: Armandina Gemma, MD;  Location: [facility];  Service: General;  Laterality: N/A;   TONSILLECTOMY     as a child     Medications Prior to Admission: Prior to Admission medications   Medication Sig Start Date End Date Taking? Authorizing Provider  Ascorbic Acid (VITAMIN C) 1000 MG tablet Take 1,000 mg by mouth in the morning.   Yes [provider]  Azelastine HCl 0.15 % SOLN Place 2 sprays into both nostrils at bedtime. 01/12/21  Yes [provider]  cetirizine (ZYRTEC) 10 MG tablet Take 10 mg by  mouth every evening.   Yes [provider]  CHELATED MAGNESIUM PO Take 100 mg by mouth in the morning and at bedtime.   Yes [provider]  Cholecalciferol (VITAMIN D-3) 125 MCG (5000 UT) TABS Take 5,000 Units by mouth every evening.   Yes [provider]  ELIQUIS 5 MG TABS tablet Take 5 mg by mouth 2 (two) times daily. 01/16/21  Yes [provider]  EPINEPHrine 0.3 mg/0.3 mL IJ SOAJ injection Inject 0.3 mg into the muscle as needed for anaphylaxis.   Yes [provider]  fexofenadine (ALLEGRA) 180 MG tablet Take 180 mg by mouth in the morning.   Yes [provider]  flecainide (TAMBOCOR) 100 MG tablet Take 1 tablet (100 mg total) by mouth 2 (two) times daily. 03/01/21  Yes Fenton, Clint R, PA  Green Tea, Camellia sinensis, (GREEN TEA EXTRACT PO) Take 350 mg by mouth every evening.   Yes [provider]  hydroxychloroquine (PLAQUENIL) 200 MG tablet Take 200 mg by mouth 2 (two) times daily. 03/02/21  Yes [provider]  Menatetrenone (VITAMIN K2) 100 MCG TABS Take 100 mcg by mouth every evening.   Yes [provider]  Methylcobalamin 5000 MCG TBDP Take 5,000 mcg by mouth in the morning.   Yes [provider]  metoprolol succinate (TOPROL-XL) 25 MG 24 hr tablet Take 25 mg by mouth in the morning. 01/16/21  Yes [provider]  [place]  Natural Products (MIXED TOCOTRIENOLS W/ VITA E PO) Take 1 capsule by mouth in the morning.   Yes [provider]  Multiple Vitamin (MULTIVITAMIN WITH MINERALS) TABS tablet Take 1 tablet by mouth in the morning.   Yes [provider]  naproxen sodium (ALEVE) 220 MG tablet Take 660-880 mg by mouth daily as needed (arthritis pain).   Yes [provider]  NON FORMULARY Inject 1 Dose as directed once a week. Allergy shots   Yes [provider]  OVER THE COUNTER MEDICATION Take 1 capsule by mouth in the morning. [employer] Curaphen   Yes [provider]  Red Yeast Rice 600 MG CAPS Take 600 mg by mouth in the morning and at bedtime.   Yes [provider]  tamsulosin (FLOMAX) 0.4 MG CAPS capsule Take 0.4 mg by mouth at bedtime. 01/02/21  Yes [provider]  TOCOPHEROLS-TOCOTRIENOLS PO Take 1 capsule by mouth every evening.   Yes [provider]  Ubiquinol 100 MG CAPS Take 100 mg by mouth in the morning.   Yes [provider]  Zinc 50 MG TABS Take 50 mg by mouth every evening.   Yes [provider]  golimumab (SIMPONI ARIA) 50 MG/4ML SOLN injection Inject 50 mg into the vein every 8 (eight) weeks.    [provider]     Allergies:    Allergies  Allergen Reactions   Prednisone     Other reaction(s): makes hyper/anxious   Ciprofloxacin Rash and Other (See Comments)    pain   Sulfa Antibiotics Rash    pain    Social History:   Social History   Socioeconomic History   Marital status: Married    Spouse name: Not on file   Number of children: Not on file   Years of education: Not on file   Highest education level: Not on file  Occupational History   Occupation: retired    Fish farm manager: [employer]  Tobacco Use   Smoking status: Former    Pack years: 0.00    Types: Cigarettes    Quit date: 07/10/2008    Years since quitting: 12.6   Smokeless tobacco: Never  Vaping Use   Vaping Use: Never used  Substance and Sexual Activity   Alcohol use: Yes    Alcohol/week: 12.0 standard drinks    Types: 12 Cans of beer per week    Comment: 12 cans per week/1-5beers per sitting   Drug use: No   Sexual activity: Not on file  Other Topics Concern   Not on file  Social History Narrative   Left handed    Social Determinants of Health   Financial Resource Strain: Not on file  Food Insecurity: Not on file  Transportation Needs: Not on file  Physical Activity: Not on file  Stress: Not on file  Social Connections: Not on file  Intimate Partner Violence: Not on  file     Family History:   The patient's family history includes Lung cancer in his brother and mother. There is no history of Colon cancer, Pancreatic cancer, Rectal cancer, Stomach cancer, Colon polyps, or Esophageal cancer.    ROS:  All other ROS reviewed and negative. Pertinent positives noted in the HPI.     Physical Exam/Data:   Vitals:   03/10/21 1246 03/10/21 1247 03/10/21 1257 03/10/21 1309  BP:  113/71 116/80 128/81  Pulse: 88  87 90  Resp: 19 (!) 21 (!) 21 (!) 23  Temp:  98.5  F (36.9 C)    TempSrc:  Oral    SpO2: 98% 97% 96% 97%  Weight:      Height:       No intake or output data in the 24 hours ending 03/15/21 1344  Last 3 Weights 03/10/2021 02/24/2021 02/20/2021  Weight (lbs) 215 lb 217 lb 3.2 oz 215 lb  Weight (kg) 97.523 kg 98.521 kg 97.523 kg    Body mass index is 30.85 kg/m.  General: Well nourished, well developed, in no acute distress Head: Atraumatic, normal size  Eyes: PEERLA, EOMI  Neck: Supple, no JVD Endocrine: No thryomegaly Cardiac: Normal S1, S2; irregular rhythm Lungs: Clear to auscultation bilaterally, no wheezing, rhonchi or rales  Abd: Soft, nontender, no hepatomegaly  Ext: No edema, pulses 2+ Musculoskeletal: No deformities, BUE and BLE strength normal and equal Skin: Warm and dry, no rashes   Neuro: Alert and oriented to person, place, time, and situation, CNII-XII grossly intact, no focal deficits  Psych: Normal mood and affect    Laboratory Data: High Sensitivity Troponin:  No results for input(s): TROPONINIHS in the last 720 hours.    Cardiac EnzymesNo results for input(s): TROPONINI in the last 168 hours. No results for input(s): TROPIPOC in the last 168 hours.  ChemistryNo results for input(s): NA, K, CL, CO2, GLUCOSE, BUN, CREATININE, CALCIUM, GFRNONAA, GFRAA, ANIONGAP in the last 168 hours.  No results for input(s): PROT, ALBUMIN, AST, ALT, ALKPHOS, BILITOT in the last 168 hours. HematologyNo results for input(s): WBC, RBC, HGB,  HCT, MCV, MCH, MCHC, RDW, PLT in the last 168 hours. BNPNo results for input(s): BNP, PROBNP in the last 168 hours.  DDimer No results for input(s): DDIMER in the last 168 hours.  Radiology/Studies:  No results found.  Assessment and Plan:  Persistent Afib -NPO. On eliquis >3 weeks. DCCV today.  -see consent statement.   Shared Decision Making/Informed Consent The risks (stroke, cardiac arrhythmias rarely resulting in the need for a temporary or permanent pacemaker, skin irritation or burns and complications associated with conscious sedation including aspiration, arrhythmia, respiratory failure and death), benefits (restoration of normal sinus rhythm) and alternatives of a direct current cardioversion were explained in detail to Frederick Bridges and he agrees to proceed.   Frederick Bells T. Audie Box, MD, [place]  03/15/2021 1:44 PM

## 2021-03-16 ENCOUNTER — Telehealth: Payer: Self-pay | Admitting: Pharmacist

## 2021-03-16 NOTE — Telephone Encounter (Signed)
Received message from Dr [name] regarding plaquenil/flecainide drug interaction and Eliquis cost.  Pt ok to stay on both plaquenil and flecainide, recommend continued routine EKG monitoring to keep an eye on his QTc in the future (QTc was stable at visit yesterday).  I called over to his pharmacy about the Eliquis, he has a Medicare plan so he can't use a copay card. They quoted his last copay at $330 but couldn't tell if it was a deductible issue or if that's just his copay. The only cheaper option would be warfarin. I spoke with pt, he states it's an issue with [facility]. He is up for a new plan in October and plans to look for a different plan that covers Eliquis better. Reports he is ok with copay until that time. He was appreciative for the call.

## 2021-03-22 DIAGNOSIS — J3089 Other allergic rhinitis: Secondary | ICD-10-CM | POA: Diagnosis not present

## 2021-03-22 DIAGNOSIS — J301 Allergic rhinitis due to pollen: Secondary | ICD-10-CM | POA: Diagnosis not present

## 2021-03-22 DIAGNOSIS — J3081 Allergic rhinitis due to animal (cat) (dog) hair and dander: Secondary | ICD-10-CM | POA: Diagnosis not present

## 2021-03-27 DIAGNOSIS — M0579 Rheumatoid arthritis with rheumatoid factor of multiple sites without organ or systems involvement: Secondary | ICD-10-CM | POA: Diagnosis not present

## 2021-03-29 DIAGNOSIS — J3081 Allergic rhinitis due to animal (cat) (dog) hair and dander: Secondary | ICD-10-CM | POA: Diagnosis not present

## 2021-03-29 DIAGNOSIS — J3089 Other allergic rhinitis: Secondary | ICD-10-CM | POA: Diagnosis not present

## 2021-03-29 DIAGNOSIS — J301 Allergic rhinitis due to pollen: Secondary | ICD-10-CM | POA: Diagnosis not present

## 2021-04-03 DIAGNOSIS — M25561 Pain in right knee: Secondary | ICD-10-CM | POA: Diagnosis not present

## 2021-04-05 DIAGNOSIS — J3081 Allergic rhinitis due to animal (cat) (dog) hair and dander: Secondary | ICD-10-CM | POA: Diagnosis not present

## 2021-04-05 DIAGNOSIS — J301 Allergic rhinitis due to pollen: Secondary | ICD-10-CM | POA: Diagnosis not present

## 2021-04-05 DIAGNOSIS — J3089 Other allergic rhinitis: Secondary | ICD-10-CM | POA: Diagnosis not present

## 2021-04-12 DIAGNOSIS — J301 Allergic rhinitis due to pollen: Secondary | ICD-10-CM | POA: Diagnosis not present

## 2021-04-12 DIAGNOSIS — J3089 Other allergic rhinitis: Secondary | ICD-10-CM | POA: Diagnosis not present

## 2021-04-12 DIAGNOSIS — J3081 Allergic rhinitis due to animal (cat) (dog) hair and dander: Secondary | ICD-10-CM | POA: Diagnosis not present

## 2021-04-19 DIAGNOSIS — J3089 Other allergic rhinitis: Secondary | ICD-10-CM | POA: Diagnosis not present

## 2021-04-19 DIAGNOSIS — J301 Allergic rhinitis due to pollen: Secondary | ICD-10-CM | POA: Diagnosis not present

## 2021-04-19 DIAGNOSIS — J3081 Allergic rhinitis due to animal (cat) (dog) hair and dander: Secondary | ICD-10-CM | POA: Diagnosis not present

## 2021-04-20 DIAGNOSIS — M1712 Unilateral primary osteoarthritis, left knee: Secondary | ICD-10-CM | POA: Diagnosis not present

## 2021-04-20 DIAGNOSIS — M25562 Pain in left knee: Secondary | ICD-10-CM | POA: Diagnosis not present

## 2021-04-20 DIAGNOSIS — M25561 Pain in right knee: Secondary | ICD-10-CM | POA: Diagnosis not present

## 2021-04-24 DIAGNOSIS — L821 Other seborrheic keratosis: Secondary | ICD-10-CM | POA: Diagnosis not present

## 2021-04-24 DIAGNOSIS — L905 Scar conditions and fibrosis of skin: Secondary | ICD-10-CM | POA: Diagnosis not present

## 2021-04-24 DIAGNOSIS — D229 Melanocytic nevi, unspecified: Secondary | ICD-10-CM | POA: Diagnosis not present

## 2021-04-24 DIAGNOSIS — Z85828 Personal history of other malignant neoplasm of skin: Secondary | ICD-10-CM | POA: Diagnosis not present

## 2021-04-24 DIAGNOSIS — L814 Other melanin hyperpigmentation: Secondary | ICD-10-CM | POA: Diagnosis not present

## 2021-04-24 DIAGNOSIS — L819 Disorder of pigmentation, unspecified: Secondary | ICD-10-CM | POA: Diagnosis not present

## 2021-04-26 DIAGNOSIS — J3089 Other allergic rhinitis: Secondary | ICD-10-CM | POA: Diagnosis not present

## 2021-04-26 DIAGNOSIS — J301 Allergic rhinitis due to pollen: Secondary | ICD-10-CM | POA: Diagnosis not present

## 2021-04-26 DIAGNOSIS — J3081 Allergic rhinitis due to animal (cat) (dog) hair and dander: Secondary | ICD-10-CM | POA: Diagnosis not present

## 2021-05-03 ENCOUNTER — Encounter (HOSPITAL_COMMUNITY): Payer: Self-pay | Admitting: Physician Assistant

## 2021-05-03 ENCOUNTER — Ambulatory Visit (HOSPITAL_COMMUNITY)
Admission: RE | Admit: 2021-05-03 | Discharge: 2021-05-03 | Disposition: A | Discharge status: Home / Self Care | Payer: Medicare Other | Source: Ambulatory Visit | Attending: Physician Assistant | Admitting: Physician Assistant

## 2021-05-03 ENCOUNTER — Other Ambulatory Visit: Payer: Self-pay

## 2021-05-03 VITALS — BP 122/74 | HR 62 | Ht 70.0 in | Wt 214.6 lb

## 2021-05-03 DIAGNOSIS — Z6830 Body mass index (BMI) 30.0-30.9, adult: Secondary | ICD-10-CM | POA: Diagnosis not present

## 2021-05-03 DIAGNOSIS — E669 Obesity, unspecified: Secondary | ICD-10-CM | POA: Diagnosis not present

## 2021-05-03 DIAGNOSIS — Z9989 Dependence on other enabling machines and devices: Secondary | ICD-10-CM | POA: Insufficient documentation

## 2021-05-03 DIAGNOSIS — Z7182 Exercise counseling: Secondary | ICD-10-CM | POA: Insufficient documentation

## 2021-05-03 DIAGNOSIS — Z7901 Long term (current) use of anticoagulants: Secondary | ICD-10-CM | POA: Insufficient documentation

## 2021-05-03 DIAGNOSIS — Z87891 Personal history of nicotine dependence: Secondary | ICD-10-CM | POA: Diagnosis not present

## 2021-05-03 DIAGNOSIS — Z882 Allergy status to sulfonamides status: Secondary | ICD-10-CM | POA: Diagnosis not present

## 2021-05-03 DIAGNOSIS — J3089 Other allergic rhinitis: Secondary | ICD-10-CM | POA: Diagnosis not present

## 2021-05-03 DIAGNOSIS — G4733 Obstructive sleep apnea (adult) (pediatric): Secondary | ICD-10-CM | POA: Diagnosis not present

## 2021-05-03 DIAGNOSIS — J301 Allergic rhinitis due to pollen: Secondary | ICD-10-CM | POA: Diagnosis not present

## 2021-05-03 DIAGNOSIS — J3081 Allergic rhinitis due to animal (cat) (dog) hair and dander: Secondary | ICD-10-CM | POA: Diagnosis not present

## 2021-05-03 DIAGNOSIS — I4819 Other persistent atrial fibrillation: Secondary | ICD-10-CM | POA: Insufficient documentation

## 2021-05-03 DIAGNOSIS — Z881 Allergy status to other antibiotic agents status: Secondary | ICD-10-CM | POA: Insufficient documentation

## 2021-05-03 DIAGNOSIS — Z888 Allergy status to other drugs, medicaments and biological substances status: Secondary | ICD-10-CM | POA: Diagnosis not present

## 2021-05-03 DIAGNOSIS — Z79899 Other long term (current) drug therapy: Secondary | ICD-10-CM | POA: Diagnosis not present

## 2021-05-03 NOTE — Patient Instructions (Signed)
Stop Eliquis

## 2021-05-03 NOTE — Progress Notes (Signed)
Primary Care Physician: [name], MD Primary Cardiologist: Dr [name]  Primary Electrophysiologist: none Referring Physician: Dr Gwynneth Macleod is a 70 y.o. male with a history of AAA, RA, OSA, and atrial fibrillation who presents for consultation in the [facility].  The patient was initially diagnosed with atrial fibrillation 01/2021 after presenting to his PCP for routine follow up. He is unaware of his arrhythmia and rate controlled. Patient is on Eliquis for a CHADS2VASC score of 1. He underwent DCCV on 02/20/21 which was unsuccessful. He does consume alcohol. He has an oral device for his OSA and is compliant.   On follow up today, patient is s/p DCCV on 03/10/21. He reports that he has done well since his last visit. He remains in SR. He denies any bleeding issues on anticoagulation. No heart racing or palpitations.   Today, he denies symptoms of palpitations, chest pain, shortness of breath, orthopnea, PND, lower extremity edema, dizziness, presyncope, syncope, bleeding, or neurologic sequela. The patient is tolerating medications without difficulties and is otherwise without complaint today.    Atrial Fibrillation Risk Factors:  he does have symptoms or diagnosis of sleep apnea. he is compliant with oral device for OSA. he does not have a history of rheumatic fever. he does have a history of alcohol use. The patient does not have a history of early familial atrial fibrillation or other arrhythmias.  he has a BMI of Body mass index is 30.79 kg/m.Marland Kitchen Filed Weights   05/03/21 0834  Weight: 97.3 kg     Family History  Problem Relation Age of Onset   Lung cancer Mother    Lung cancer Brother    Colon cancer Neg Hx    Pancreatic cancer Neg Hx    Rectal cancer Neg Hx    Stomach cancer Neg Hx    Colon polyps Neg Hx    Esophageal cancer Neg Hx      Atrial Fibrillation Management history:  Previous antiarrhythmic drugs:  flecainide Previous cardioversions: 02/20/21, 03/10/21 Previous ablations: none CHADS2VASC score: 1 Anticoagulation history: Eliquis   Past Medical History:  Diagnosis Date   A-fib [facility])    AAA (abdominal aortic aneurysm) (HCC)    Allergy    hayfever   Colon polyps    Hay fever    Nocturia    Personal history of colonic adenomas 12/22/2012   Rheumatoid arthritis ([place])    Sleep apnea    Dental Device   Wears glasses    Past Surgical History:  Procedure Laterality Date   CARDIOVERSION N/A 02/20/2021   Procedure: CARDIOVERSION;  Surgeon: Sueanne Margarita, MD;  Location: [place];  Service: Cardiovascular;  Laterality: N/A;   CARDIOVERSION N/A 03/10/2021   Procedure: CARDIOVERSION;  Surgeon: Geralynn Rile, MD;  Location: [place];  Service: Cardiovascular;  Laterality: N/A;   COLONOSCOPY  multiple   INGUINAL HERNIA REPAIR     PARATHYROIDECTOMY N/A 09/02/2015   Procedure: PARATHYROIDECTOMY;  Surgeon: Armandina Gemma, MD;  Location: [place];  Service: General;  Laterality: N/A;   TONSILLECTOMY     as a child    Current Outpatient Medications  Medication Sig Dispense Refill   Ascorbic Acid (VITAMIN C) 1000 MG tablet Take 1,000 mg by mouth in the morning.     Azelastine HCl 0.15 % SOLN Place 2 sprays into both nostrils at bedtime.     cetirizine (ZYRTEC) 10 MG tablet Take 10 mg by mouth every evening.  CHELATED MAGNESIUM PO Take 100 mg by mouth in the morning and at bedtime.     Cholecalciferol (VITAMIN D-3) 125 MCG (5000 UT) TABS Take 5,000 Units by mouth every evening.     EPINEPHrine 0.3 mg/0.3 mL IJ SOAJ injection Inject 0.3 mg into the muscle as needed for anaphylaxis.     fexofenadine (ALLEGRA) 180 MG tablet Take 180 mg by mouth in the morning.     flecainide (TAMBOCOR) 100 MG tablet Take 1 tablet (100 mg total) by mouth 2 (two) times daily. 60 tablet 3   fluticasone (FLONASE) 50 MCG/ACT nasal spray Place 1 spray into both nostrils every morning.     golimumab  (SIMPONI ARIA) 50 MG/4ML SOLN injection Inject 50 mg into the vein every 8 (eight) weeks.     Green Tea, Camellia sinensis, (GREEN TEA EXTRACT PO) Take 350 mg by mouth every evening.     hydroxychloroquine (PLAQUENIL) 200 MG tablet Take 200 mg by mouth 2 (two) times daily.     Menatetrenone (VITAMIN K2) 100 MCG TABS Take 100 mcg by mouth every evening.     Methylcobalamin 5000 MCG TBDP Take 5,000 mcg by mouth in the morning.     metoprolol succinate (TOPROL-XL) 25 MG 24 hr tablet Take 25 mg by mouth in the morning.     Misc Natural Products (MIXED TOCOTRIENOLS W/ VITA E PO) Take 1 capsule by mouth in the morning.     Multiple Vitamin (MULTIVITAMIN WITH MINERALS) TABS tablet Take 1 tablet by mouth in the morning.     NON FORMULARY Inject 1 Dose as directed once a week. Allergy shots     OVER THE COUNTER MEDICATION Take 1 capsule by mouth in the morning. [employer] Curaphen     Red Yeast Rice 600 MG CAPS Take 600 mg by mouth in the morning and at bedtime.     tamsulosin (FLOMAX) 0.4 MG CAPS capsule Take 0.4 mg by mouth at bedtime.     TOCOPHEROLS-TOCOTRIENOLS PO Take 1 capsule by mouth every evening.     Ubiquinol 100 MG CAPS Take 100 mg by mouth in the morning.     Zinc 50 MG TABS Take 50 mg by mouth every evening.     naproxen sodium (ALEVE) 220 MG tablet Take 660-880 mg by mouth daily as needed (arthritis pain). (Patient not taking: Reported on 05/03/2021)     No current facility-administered medications for this encounter.    Allergies  Allergen Reactions   Prednisone     Other reaction(s): makes hyper/anxious   Ciprofloxacin Rash and Other (See Comments)    pain   Sulfa Antibiotics Rash    pain    Social History   Socioeconomic History   Marital status: Married    Spouse name: Not on file   Number of children: Not on file   Years of education: Not on file   Highest education level: Not on file  Occupational History   Occupation: retired    Fish farm manager: [employer]   Tobacco Use   Smoking status: Former    Types: Cigarettes    Quit date: 07/10/2008    Years since quitting: 12.8   Smokeless tobacco: Never   Tobacco comments:    Former smoker 05/03/2021  Vaping Use   Vaping Use: Never used  Substance and Sexual Activity   Alcohol use: Not Currently    Alcohol/week: 12.0 standard drinks    Types: 12 Cans of beer per week    Comment: 12 cans  per week/1-5beers per sitting   Drug use: No   Sexual activity: Not on file  Other Topics Concern   Not on file  Social History Narrative   Left handed    Social Determinants of Health   Financial Resource Strain: Not on file  Food Insecurity: Not on file  Transportation Needs: Not on file  Physical Activity: Not on file  Stress: Not on file  Social Connections: Not on file  Intimate Partner Violence: Not on file     ROS- All systems are reviewed and negative except as per the HPI above.  Physical Exam: Vitals:   05/03/21 0834  BP: 122/74  Pulse: 62  Weight: 97.3 kg  Height: '5\' 10"'$  (1.778 m)    GEN- The patient is a well appearing obese male, alert and oriented x 3 today.   HEENT-head normocephalic, atraumatic, sclera clear, conjunctiva pink, hearing intact, trachea midline. Lungs- Clear to ausculation bilaterally, normal work of breathing Heart- Regular rate and rhythm, no murmurs, rubs or gallops  GI- soft, NT, ND, + BS Extremities- no clubbing, cyanosis, or edema MS- no significant deformity or atrophy Skin- no rash or lesion Psych- euthymic mood, full affect Neuro- strength and sensation are intact   Wt Readings from Last 3 Encounters:  05/03/21 97.3 kg  03/15/21 99.9 kg  03/10/21 97.5 kg    EKG today demonstrates  SR Vent. rate 62 BPM PR interval 160 ms QRS duration 102 ms QT/QTcB 420/426 ms  Echo 02/13/21 demonstrated  1. The mitral valve is grossly normal. Moderate mitral valve  regurgitation- mechanism appears to be atrial functional; there is  systolic blunting of  the pulmonary vein flow. No evidence of mitral  stenosis.   2. Left atrial size was severely dilated.   3. Left ventricular ejection fraction, by estimation, is 50 to 55%. The  left ventricle has low normal function. The left ventricle has no regional wall motion abnormalities. Left ventricular diastolic parameters are indeterminate.   4. Right ventricular systolic function is normal. The right ventricular  size is normal. Tricuspid regurgitation signal is inadequate for assessing PA pressure.   5. Right atrial size was mildly dilated.   6. The aortic valve is tricuspid. There is mild calcification of the  aortic valve. There is mild thickening of the aortic valve. Aortic valve  regurgitation is not visualized. No aortic stenosis is present.   7. Aortic dilatation noted. There is mild dilatation of the aortic root,  measuring 41 mm.   8. The inferior vena cava is normal in size with greater than 50%  respiratory variability, suggesting right atrial pressure of 3 mmHg.   Epic records are reviewed at length today  CHA2DS2-VASc Score = 1  The patient's score is based upon: CHF History: No HTN History: No Diabetes History: No Stroke History: No Vascular Disease History: No Age Score: 1 Gender Score: 0     ASSESSMENT AND PLAN: 1. Persistent Atrial Fibrillation (ICD10:  I48.19) The patient's CHA2DS2-VASc score is 1, indicating a 0.6% annual risk of stroke.   S/p DCCV 03/10/21 Patient appears to be maintaining SR. Continue flecainide 100 mg BID We discussed his stroke risk and the risks and benefits of anticoagulation. By guidelines, he does not require long-term anticoagulation. Patient would prefer to discontinue Eliquis at this time.  Continue Toprol 25 mg daily  2. Obesity Body mass index is 30.79 kg/m. Lifestyle modification was discussed and encouraged including regular physical activity and weight reduction.  3. Obstructive sleep apnea  Patient is compliant with oral  device.    Follow up with Dr [name] as scheduled. AF clinic in 6 months.    [facility] [address] [place], [place] [address] [phone] 05/03/2021 1:19 PM

## 2021-05-06 DIAGNOSIS — M25562 Pain in left knee: Secondary | ICD-10-CM | POA: Diagnosis not present

## 2021-05-09 DIAGNOSIS — L409 Psoriasis, unspecified: Secondary | ICD-10-CM | POA: Diagnosis not present

## 2021-05-09 DIAGNOSIS — Z6830 Body mass index (BMI) 30.0-30.9, adult: Secondary | ICD-10-CM | POA: Diagnosis not present

## 2021-05-09 DIAGNOSIS — Z79899 Other long term (current) drug therapy: Secondary | ICD-10-CM | POA: Diagnosis not present

## 2021-05-09 DIAGNOSIS — M0579 Rheumatoid arthritis with rheumatoid factor of multiple sites without organ or systems involvement: Secondary | ICD-10-CM | POA: Diagnosis not present

## 2021-05-09 DIAGNOSIS — E669 Obesity, unspecified: Secondary | ICD-10-CM | POA: Diagnosis not present

## 2021-05-09 DIAGNOSIS — M2550 Pain in unspecified joint: Secondary | ICD-10-CM | POA: Diagnosis not present

## 2021-05-09 DIAGNOSIS — M47816 Spondylosis without myelopathy or radiculopathy, lumbar region: Secondary | ICD-10-CM | POA: Diagnosis not present

## 2021-05-11 DIAGNOSIS — J3081 Allergic rhinitis due to animal (cat) (dog) hair and dander: Secondary | ICD-10-CM | POA: Diagnosis not present

## 2021-05-11 DIAGNOSIS — S83242D Other tear of medial meniscus, current injury, left knee, subsequent encounter: Secondary | ICD-10-CM | POA: Diagnosis not present

## 2021-05-11 DIAGNOSIS — S83249A Other tear of medial meniscus, current injury, unspecified knee, initial encounter: Secondary | ICD-10-CM | POA: Insufficient documentation

## 2021-05-11 DIAGNOSIS — J3089 Other allergic rhinitis: Secondary | ICD-10-CM | POA: Diagnosis not present

## 2021-05-11 DIAGNOSIS — J301 Allergic rhinitis due to pollen: Secondary | ICD-10-CM | POA: Diagnosis not present

## 2021-05-17 DIAGNOSIS — J3089 Other allergic rhinitis: Secondary | ICD-10-CM | POA: Diagnosis not present

## 2021-05-17 DIAGNOSIS — J3081 Allergic rhinitis due to animal (cat) (dog) hair and dander: Secondary | ICD-10-CM | POA: Diagnosis not present

## 2021-05-17 DIAGNOSIS — J301 Allergic rhinitis due to pollen: Secondary | ICD-10-CM | POA: Diagnosis not present

## 2021-05-19 DIAGNOSIS — M069 Rheumatoid arthritis, unspecified: Secondary | ICD-10-CM | POA: Diagnosis not present

## 2021-05-19 DIAGNOSIS — I4891 Unspecified atrial fibrillation: Secondary | ICD-10-CM | POA: Diagnosis not present

## 2021-05-19 DIAGNOSIS — G4733 Obstructive sleep apnea (adult) (pediatric): Secondary | ICD-10-CM | POA: Diagnosis not present

## 2021-05-19 DIAGNOSIS — R739 Hyperglycemia, unspecified: Secondary | ICD-10-CM | POA: Diagnosis not present

## 2021-05-19 DIAGNOSIS — E785 Hyperlipidemia, unspecified: Secondary | ICD-10-CM | POA: Diagnosis not present

## 2021-05-19 DIAGNOSIS — I10 Essential (primary) hypertension: Secondary | ICD-10-CM | POA: Diagnosis not present

## 2021-05-19 DIAGNOSIS — M25562 Pain in left knee: Secondary | ICD-10-CM | POA: Diagnosis not present

## 2021-05-19 DIAGNOSIS — G8929 Other chronic pain: Secondary | ICD-10-CM | POA: Diagnosis not present

## 2021-05-23 DIAGNOSIS — X58XXXA Exposure to other specified factors, initial encounter: Secondary | ICD-10-CM | POA: Diagnosis not present

## 2021-05-23 DIAGNOSIS — Y999 Unspecified external cause status: Secondary | ICD-10-CM | POA: Diagnosis not present

## 2021-05-23 DIAGNOSIS — S83232A Complex tear of medial meniscus, current injury, left knee, initial encounter: Secondary | ICD-10-CM | POA: Diagnosis not present

## 2021-05-23 DIAGNOSIS — G8918 Other acute postprocedural pain: Secondary | ICD-10-CM | POA: Diagnosis not present

## 2021-05-23 DIAGNOSIS — S83272A Complex tear of lateral meniscus, current injury, left knee, initial encounter: Secondary | ICD-10-CM | POA: Diagnosis not present

## 2021-05-23 DIAGNOSIS — M958 Other specified acquired deformities of musculoskeletal system: Secondary | ICD-10-CM | POA: Diagnosis not present

## 2021-05-29 ENCOUNTER — Other Ambulatory Visit (HOSPITAL_COMMUNITY): Payer: Self-pay | Admitting: Physician Assistant

## 2021-05-31 DIAGNOSIS — J3089 Other allergic rhinitis: Secondary | ICD-10-CM | POA: Diagnosis not present

## 2021-05-31 DIAGNOSIS — J3081 Allergic rhinitis due to animal (cat) (dog) hair and dander: Secondary | ICD-10-CM | POA: Diagnosis not present

## 2021-05-31 DIAGNOSIS — J301 Allergic rhinitis due to pollen: Secondary | ICD-10-CM | POA: Diagnosis not present

## 2021-06-06 DIAGNOSIS — M0579 Rheumatoid arthritis with rheumatoid factor of multiple sites without organ or systems involvement: Secondary | ICD-10-CM | POA: Diagnosis not present

## 2021-06-07 DIAGNOSIS — J301 Allergic rhinitis due to pollen: Secondary | ICD-10-CM | POA: Diagnosis not present

## 2021-06-07 DIAGNOSIS — J3081 Allergic rhinitis due to animal (cat) (dog) hair and dander: Secondary | ICD-10-CM | POA: Diagnosis not present

## 2021-06-07 DIAGNOSIS — J3089 Other allergic rhinitis: Secondary | ICD-10-CM | POA: Diagnosis not present

## 2021-06-09 ENCOUNTER — Other Ambulatory Visit: Payer: Self-pay

## 2021-06-09 DIAGNOSIS — I714 Abdominal aortic aneurysm, without rupture, unspecified: Secondary | ICD-10-CM

## 2021-06-09 NOTE — Addendum Note (Signed)
Addended byDoylene Bode on: 06/09/2021 10:24 AM   Modules accepted: Orders

## 2021-06-13 ENCOUNTER — Ambulatory Visit (HOSPITAL_COMMUNITY)
Admission: RE | Admit: 2021-06-13 | Discharge: 2021-06-13 | Disposition: A | Discharge status: Home / Self Care | Payer: Medicare Other | Source: Ambulatory Visit | Attending: Vascular Surgery | Admitting: Vascular Surgery

## 2021-06-13 ENCOUNTER — Other Ambulatory Visit: Payer: Self-pay

## 2021-06-13 ENCOUNTER — Ambulatory Visit (INDEPENDENT_AMBULATORY_CARE_PROVIDER_SITE_OTHER): Payer: Medicare Other | Admitting: Physician Assistant

## 2021-06-13 VITALS — BP 122/78 | HR 67 | Temp 97.9°F | Resp 20 | Ht 70.0 in | Wt 216.7 lb

## 2021-06-13 DIAGNOSIS — J3081 Allergic rhinitis due to animal (cat) (dog) hair and dander: Secondary | ICD-10-CM | POA: Insufficient documentation

## 2021-06-13 DIAGNOSIS — I714 Abdominal aortic aneurysm, without rupture, unspecified: Secondary | ICD-10-CM | POA: Insufficient documentation

## 2021-06-13 DIAGNOSIS — J300 Vasomotor rhinitis: Secondary | ICD-10-CM | POA: Insufficient documentation

## 2021-06-13 DIAGNOSIS — I7143 Infrarenal abdominal aortic aneurysm, without rupture: Secondary | ICD-10-CM

## 2021-06-13 DIAGNOSIS — J301 Allergic rhinitis due to pollen: Secondary | ICD-10-CM | POA: Insufficient documentation

## 2021-06-13 NOTE — Progress Notes (Signed)
[facility] [place] HISTORY AND PHYSICAL   History of Present Illness:  Patient is a 70 y.o. year old male who presents for evaluation of abdominal aortic aneurysm.  The aneurysm is currently 3.0 cm in diameter by US performed on 10/20.  The patient denies abdominal pain.  The patient denies back pain.   Other medical problems include new A fib rate controlled with Metoprolol.  He takes a daily 81 mg ASA.  Past Medical History:  Diagnosis Date   A-fib [facility])    AAA (abdominal aortic aneurysm)    Allergy    hayfever   Colon polyps    Hay fever    Nocturia    Personal history of colonic adenomas 12/22/2012   Rheumatoid arthritis ([place])    Sleep apnea    Dental Device   Wears glasses     Past Surgical History:  Procedure Laterality Date   CARDIOVERSION N/A 02/20/2021   Procedure: CARDIOVERSION;  Surgeon: Sueanne Margarita, MD;  [facility];  Service: Cardiovascular;  Laterality: N/A;   CARDIOVERSION N/A 03/10/2021   Procedure: CARDIOVERSION;  Surgeon: Geralynn Rile, MD;  Location: [place];  Service: Cardiovascular;  Laterality: N/A;   COLONOSCOPY  multiple   INGUINAL HERNIA REPAIR     PARATHYROIDECTOMY N/A 09/02/2015   Procedure: PARATHYROIDECTOMY;  Surgeon: Armandina Gemma, MD;  Location: [place];  Service: General;  Laterality: N/A;   TONSILLECTOMY     as a child     Social History Social History   Tobacco Use   Smoking status: Former    Types: Cigarettes    Quit date: 07/10/2008    Years since quitting: 12.9   Smokeless tobacco: Never   Tobacco comments:    Former smoker 05/03/2021  Vaping Use   Vaping Use: Never used  Substance Use Topics   Alcohol use: Not Currently    Alcohol/week: 12.0 standard drinks    Types: 12 Cans of beer per week    Comment: 12 cans per week/1-5beers per sitting   Drug use: No    Family History Family History  Problem Relation Age of Onset   Lung cancer Mother    Lung cancer Brother    Colon  cancer Neg Hx    Pancreatic cancer Neg Hx    Rectal cancer Neg Hx    Stomach cancer Neg Hx    Colon polyps Neg Hx    Esophageal cancer Neg Hx     Allergies  Allergies  Allergen Reactions   Prednisone     Other reaction(s): makes hyper/anxious   Ciprofloxacin Rash and Other (See Comments)    pain   Sulfa Antibiotics Rash    pain     Current Outpatient Medications  Medication Sig Dispense Refill   Ascorbic Acid (VITAMIN C) 1000 MG tablet Take 1,000 mg by mouth in the morning.     ASPIRIN 81 PO aspirin     Azelastine HCl 0.15 % SOLN Place 2 sprays into both nostrils at bedtime.     cetirizine (ZYRTEC) 10 MG tablet Take 10 mg by mouth every evening.     CHELATED MAGNESIUM PO Take 100 mg by mouth in the morning and at bedtime.     Cholecalciferol (VITAMIN D-3) 125 MCG (5000 UT) TABS Take 5,000 Units by mouth every evening.     EPINEPHrine 0.3 mg/0.3 mL IJ SOAJ injection Inject 0.3 mg into the muscle as needed for anaphylaxis.     fexofenadine (ALLEGRA) 180 MG tablet  Take 180 mg by mouth in the morning.     flecainide (TAMBOCOR) 100 MG tablet TAKE 1 TABLET BY MOUTH TWICE A DAY 180 tablet 1   fluticasone (FLONASE) 50 MCG/ACT nasal spray Place 1 spray into both nostrils every morning.     golimumab (SIMPONI ARIA) 50 MG/4ML SOLN injection Inject 50 mg into the vein every 8 (eight) weeks.     Green Tea, Camellia sinensis, (GREEN TEA EXTRACT PO) Take 350 mg by mouth every evening.     hydroxychloroquine (PLAQUENIL) 200 MG tablet Take 200 mg by mouth 2 (two) times daily.     Menatetrenone (VITAMIN K2) 100 MCG TABS Take 100 mcg by mouth every evening.     Methylcobalamin 5000 MCG TBDP Take 5,000 mcg by mouth in the morning.     metoprolol succinate (TOPROL-XL) 25 MG 24 hr tablet Take 25 mg by mouth in the morning.     Misc Natural Products (MIXED TOCOTRIENOLS W/ VITA E PO) Take 1 capsule by mouth in the morning.     Multiple Vitamin (MULTIVITAMIN WITH MINERALS) TABS tablet Take 1 tablet by  mouth in the morning.     naproxen sodium (ALEVE) 220 MG tablet Take 660-880 mg by mouth daily as needed (arthritis pain).     NON FORMULARY Inject 1 Dose as directed once a week. Allergy shots     OVER THE COUNTER MEDICATION Take 1 capsule by mouth in the morning. [employer] Curaphen     Red Yeast Rice 600 MG CAPS Take 600 mg by mouth in the morning and at bedtime.     tamsulosin (FLOMAX) 0.4 MG CAPS capsule Take 0.4 mg by mouth at bedtime.     TOCOPHEROLS-TOCOTRIENOLS PO Take 1 capsule by mouth every evening.     Ubiquinol 100 MG CAPS Take 100 mg by mouth in the morning.     Zinc 50 MG TABS Take 50 mg by mouth every evening.     No current facility-administered medications for this visit.    ROS:   General:  No weight loss, Fever, chills  HEENT: No recent headaches, no nasal bleeding, no visual changes, no sore throat  Neurologic: No dizziness, blackouts, seizures. No recent symptoms of stroke or mini- stroke. No recent episodes of slurred speech, or temporary blindness.  Cardiac: No recent episodes of chest pain/pressure, no shortness of breath at rest.  No shortness of breath with exertion.  Denies history of atrial fibrillation or irregular heartbeat  Vascular: No history of rest pain in feet.  No history of claudication.  No history of non-healing ulcer, No history of DVT   Pulmonary: No home oxygen, no productive cough, no hemoptysis,  No asthma or wheezing  Musculoskeletal:  [ ]  Arthritis, [ ]  Low back pain,  [ x] Joint pain  Hematologic:No history of hypercoagulable state.  No history of easy bleeding.  No history of anemia  Gastrointestinal: No hematochezia or melena,  No gastroesophageal reflux, no trouble swallowing  Urinary: [ ]  chronic Kidney disease, [ ]  on HD - [ ]  MWF or [ ]  TTHS, [ ]  Burning with urination, [ ]  Frequent urination, [ ]  Difficulty urinating;   Skin: No rashes  Psychological: No history of anxiety,  No history of depression   Physical  Examination  Vitals:   06/13/21 0820  BP: 122/78  Pulse: 67  Resp: 20  Temp: 97.9 F (36.6 C)  TempSrc: Temporal  SpO2: 97%  Weight: 216 lb 11.2 oz (98.3 kg)  Height: 5'  10" (1.778 m)    Body mass index is 31.09 kg/m.  General:  Alert and oriented, no acute distress HEENT: Normal Neck: No bruit or JVD Pulmonary: Clear to auscultation bilaterally Cardiac: Regular Rate and Rhythm without murmur Gastrointestinal: Soft, non-tender, non-distended, no mass, no scars Skin: No rash Extremity Pulses:  2+ radial, brachial, femoral, dorsalis pedis, posterior tibial pulses bilaterally Musculoskeletal: No deformity or edema  Neurologic: Upper and lower extremity motor 5/5 and symmetric  DATA:      Abdominal Aorta Findings:  +-----------+-------+----------+----------+--------+--------+--------+  Location   AP (cm)Trans (cm)PSV (cm/s)WaveformThrombusComments  +-----------+-------+----------+----------+--------+--------+--------+  Proximal   2.30   2.30      82                                  +-----------+-------+----------+----------+--------+--------+--------+  Mid        2.20   2.33      69                                  +-----------+-------+----------+----------+--------+--------+--------+  Distal     2.63   3.00      79                                  +-----------+-------+----------+----------+--------+--------+--------+  RT CIA Prox1.3    1.5       126                                 +-----------+-------+----------+----------+--------+--------+--------+  LT CIA Prox1.1    1.2       97                                  +-----------+-------+----------+----------+--------+--------+--------+   Summary:  Abdominal Aorta: The largest aortic measurement is 3.0 cm. The largest  aortic diameter remains essentially unchanged compared to prior exam.  Previous diameter measurement was 2.97 cm obtained on 06/12/2019.  ASSESSMENT:  AAA  3.0 cm without change on ultrasound and he remains asymptomatic He denise rest pain, claudication and non healing wounds.  He denise abdominal pain or lumbar pain.  PLAN: Given the small size and no change in size on ultrasound today he will return in 2 years for repeat AAA duplex.  If he has  new sudden lumbar or abdominal pain.   Roxy Horseman PA-C [facility] [place] Office: [phone]  MD in clinic [place]

## 2021-06-14 DIAGNOSIS — J301 Allergic rhinitis due to pollen: Secondary | ICD-10-CM | POA: Diagnosis not present

## 2021-06-14 DIAGNOSIS — J3089 Other allergic rhinitis: Secondary | ICD-10-CM | POA: Diagnosis not present

## 2021-06-14 DIAGNOSIS — J3081 Allergic rhinitis due to animal (cat) (dog) hair and dander: Secondary | ICD-10-CM | POA: Diagnosis not present

## 2021-06-21 DIAGNOSIS — J3081 Allergic rhinitis due to animal (cat) (dog) hair and dander: Secondary | ICD-10-CM | POA: Diagnosis not present

## 2021-06-21 DIAGNOSIS — J301 Allergic rhinitis due to pollen: Secondary | ICD-10-CM | POA: Diagnosis not present

## 2021-06-21 DIAGNOSIS — J3089 Other allergic rhinitis: Secondary | ICD-10-CM | POA: Diagnosis not present

## 2021-06-27 DIAGNOSIS — J3089 Other allergic rhinitis: Secondary | ICD-10-CM | POA: Diagnosis not present

## 2021-06-27 DIAGNOSIS — J301 Allergic rhinitis due to pollen: Secondary | ICD-10-CM | POA: Diagnosis not present

## 2021-06-27 DIAGNOSIS — J3081 Allergic rhinitis due to animal (cat) (dog) hair and dander: Secondary | ICD-10-CM | POA: Diagnosis not present

## 2021-07-05 DIAGNOSIS — J301 Allergic rhinitis due to pollen: Secondary | ICD-10-CM | POA: Diagnosis not present

## 2021-07-05 DIAGNOSIS — J3089 Other allergic rhinitis: Secondary | ICD-10-CM | POA: Diagnosis not present

## 2021-07-05 DIAGNOSIS — J3081 Allergic rhinitis due to animal (cat) (dog) hair and dander: Secondary | ICD-10-CM | POA: Diagnosis not present

## 2021-07-12 DIAGNOSIS — J3089 Other allergic rhinitis: Secondary | ICD-10-CM | POA: Diagnosis not present

## 2021-07-12 DIAGNOSIS — J3081 Allergic rhinitis due to animal (cat) (dog) hair and dander: Secondary | ICD-10-CM | POA: Diagnosis not present

## 2021-07-12 DIAGNOSIS — J301 Allergic rhinitis due to pollen: Secondary | ICD-10-CM | POA: Diagnosis not present

## 2021-07-19 DIAGNOSIS — J301 Allergic rhinitis due to pollen: Secondary | ICD-10-CM | POA: Diagnosis not present

## 2021-07-19 DIAGNOSIS — J3081 Allergic rhinitis due to animal (cat) (dog) hair and dander: Secondary | ICD-10-CM | POA: Diagnosis not present

## 2021-07-19 DIAGNOSIS — J3089 Other allergic rhinitis: Secondary | ICD-10-CM | POA: Diagnosis not present

## 2021-07-19 NOTE — Progress Notes (Signed)
Cardiology Office Note   Date:  07/20/2021   ID:  Frederick Bridges, Frederick Bridges 1950-11-12, MRN [id number]  PCP:  [name], MD    No chief complaint on file.  AFib  Wt Readings from Last 3 Encounters:  07/20/21 218 lb 3.2 oz (99 kg)  06/13/21 216 lb 11.2 oz (98.3 kg)  05/03/21 214 lb 9.6 oz (97.3 kg)       History of Present Illness: Frederick Bridges is a 70 y.o. male   who I saw for atrial fibrillation at the request of [name], MD.   He had a UTI in 12/21.  He reacted to two different antibiotics.     He had fluid on his knee in Jan 2022.  DVT was ruled out.  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 He got a cortisone shot- Dr. Trudie Reed. THen he went to Dr. Maureen Ralphs. 25 cc fluid drained, and more cortisone given. Knee is better.   Dr. [name] detected AFib on 01/16/21.     He declined atorvastatin in the past.  Used Enbrel for RA.     Cardioversion on 7/1: "They were cardioverted x 3 with 200J of biphasic synchronized energy.  They converted to NSR."  New RA med is not working well.  Had to stop Enbrel due to medicare.  Now of aspirin.  Now of Eliquis. Successful knee surgery in 05/2021.   Past Medical History:  Diagnosis Date   A-fib [facility])    AAA (abdominal aortic aneurysm)    Allergy    hayfever   Colon polyps    Hay fever    Nocturia    Personal history of colonic adenomas 12/22/2012   Rheumatoid arthritis ([place])    Sleep apnea    Dental Device   Wears glasses     Past Surgical History:  Procedure Laterality Date   CARDIOVERSION N/A 02/20/2021   Procedure: CARDIOVERSION;  Surgeon: Sueanne Margarita, MD;  Location: [place] [facility];  Service: Cardiovascular;  Laterality: N/A;   CARDIOVERSION N/A 03/10/2021   Procedure: CARDIOVERSION;  Surgeon: Geralynn Rile, MD;  Location: [place];  Service: Cardiovascular;  Laterality: N/A;   COLONOSCOPY  multiple   INGUINAL HERNIA REPAIR     PARATHYROIDECTOMY N/A 09/02/2015   Procedure: PARATHYROIDECTOMY;  Surgeon:  Armandina Gemma, MD;  Location: [place];  Service: General;  Laterality: N/A;   TONSILLECTOMY     as a child     Current Outpatient Medications  Medication Sig Dispense Refill   Ascorbic Acid (VITAMIN C) 1000 MG tablet Take 1,000 mg by mouth in the morning.     ASPIRIN 81 PO aspirin     Azelastine HCl 0.15 % SOLN Place 2 sprays into both nostrils at bedtime.     cetirizine (ZYRTEC) 10 MG tablet Take 10 mg by mouth every evening.     CHELATED MAGNESIUM PO Take 100 mg by mouth in the morning and at bedtime.     Cholecalciferol (VITAMIN D-3) 125 MCG (5000 UT) TABS Take 5,000 Units by mouth every evening.     EPINEPHrine 0.3 mg/0.3 mL IJ SOAJ injection Inject 0.3 mg into the muscle as needed for anaphylaxis.     fexofenadine (ALLEGRA) 180 MG tablet Take 180 mg by mouth in the morning.     flecainide (TAMBOCOR) 100 MG tablet TAKE 1 TABLET BY MOUTH TWICE A DAY 180 tablet 1   fluticasone (FLONASE) 50 MCG/ACT nasal spray Place 1 spray into both nostrils every morning.  golimumab (SIMPONI ARIA) 50 MG/4ML SOLN injection Inject 50 mg into the vein every 8 (eight) weeks.     Green Tea, Camellia sinensis, (GREEN TEA EXTRACT PO) Take 350 mg by mouth every evening.     hydroxychloroquine (PLAQUENIL) 200 MG tablet Take 200 mg by mouth 2 (two) times daily.     Menatetrenone (VITAMIN K2) 100 MCG TABS Take 100 mcg by mouth every evening.     Methylcobalamin 5000 MCG TBDP Take 5,000 mcg by mouth in the morning.     metoprolol succinate (TOPROL-XL) 25 MG 24 hr tablet Take 25 mg by mouth in the morning.     Misc Natural Products (MIXED TOCOTRIENOLS W/ VITA E PO) Take 1 capsule by mouth in the morning.     Multiple Vitamin (MULTIVITAMIN WITH MINERALS) TABS tablet Take 1 tablet by mouth in the morning.     naproxen sodium (ALEVE) 220 MG tablet Take 660-880 mg by mouth daily as needed (arthritis pain).     NON FORMULARY Inject 1 Dose as directed once a week. Allergy shots     OVER THE COUNTER MEDICATION Take 1  capsule by mouth in the morning. [employer] Curaphen     Red Yeast Rice 600 MG CAPS Take 600 mg by mouth in the morning and at bedtime.     tamsulosin (FLOMAX) 0.4 MG CAPS capsule Take 0.4 mg by mouth at bedtime.     TOCOPHEROLS-TOCOTRIENOLS PO Take 1 capsule by mouth every evening.     Ubiquinol 100 MG CAPS Take 100 mg by mouth in the morning.     Zinc 50 MG TABS Take 50 mg by mouth every evening.     No current facility-administered medications for this visit.    Allergies:   Prednisone, Ciprofloxacin, and Sulfa antibiotics    Social History:  The patient  reports that he quit smoking about 13 years ago. His smoking use included cigarettes. He has never used smokeless tobacco. He reports that he does not currently use alcohol after a past usage of about 12.0 standard drinks per week. He reports that he does not use drugs.   Family History:  The patient's family history includes Lung cancer in his brother and mother.    ROS:  Please see the history of present illness.   Otherwise, review of systems are positive for joint pains.   All other systems are reviewed and negative.    PHYSICAL EXAM: VS:  BP 128/72   Pulse 75   Ht 5\' 10"  (1.778 m)   Wt 218 lb 3.2 oz (99 kg)   SpO2 96%   BMI 31.31 kg/m  , BMI Body mass index is 31.31 kg/m. GEN: Well nourished, well developed, in no acute distress HEENT: normal Neck: no JVD, carotid bruits, or masses Cardiac: RRR; no murmurs, rubs, or gallops,no edema  Respiratory:  clear to auscultation bilaterally, normal work of breathing GI: soft, nontender, nondistended, + BS MS: no deformity or atrophy Skin: warm and dry, no rash Neuro:  Strength and sensation are intact Psych: euthymic mood, full affect   EKG:   The ekg ordered 04/2021 demonstrates NSR, no ST changes   Recent Labs: 03/01/2021: BUN 17; Creatinine, Ser 0.88; Hemoglobin 15.2; Platelets 311; Potassium 4.2; Sodium 135   Lipid Panel No results found for: CHOL, TRIG, HDL,  CHOLHDL, VLDL, LDLCALC, LDLDIRECT   Other studies Reviewed: Additional studies/ records that were reviewed today with results demonstrating: labs reviewed.   ASSESSMENT AND PLAN:  AFib: Flecainide to maintain  NSR.  Seen by AF clinic.  Anticoagulation discussed. CHADS-vasc 1 so holding off on anticoagulation. Hyperlipidemia: LDL 171 in 2021.  TG 242.  Takes red yeast rice. Calcium scoring CT scan discussed.  He has tried a atorvastatin in the past, 15-20 years ago, and had muscle pains. Shortly after he developed RA.  No one else in his family had it in the past.  He suspects there may have been a connection.  We discussed trying rosuvastatin at a low dose.  Will decide on the need for lipid-lowering therapy based on his calcium score along with a potential referral to Pharm.D. lipid clinic. OSA: tried an oral device.  Feels rested in the morning.  AAA: 3.0 cm noted by prior CT.   Current medicines are reviewed at length with the patient today.  The patient concerns regarding his medicines were addressed.  The following changes have been made:  No change  Labs/ tests ordered today include:  No orders of the defined types were placed in this encounter.   Recommend 150 minutes/week of aerobic exercise Low fat, low carb, high fiber diet recommended  Disposition:   FU in 1 year   Signed, Larae Grooms, MD  07/20/2021 10:47 AM    [facility] [place], [place], [place]  [address] Phone: [phone]; Fax: [phone]

## 2021-07-20 ENCOUNTER — Ambulatory Visit (INDEPENDENT_AMBULATORY_CARE_PROVIDER_SITE_OTHER): Payer: Medicare Other | Admitting: Interventional Cardiology

## 2021-07-20 ENCOUNTER — Other Ambulatory Visit: Payer: Self-pay

## 2021-07-20 ENCOUNTER — Encounter: Payer: Self-pay | Admitting: Interventional Cardiology

## 2021-07-20 VITALS — BP 128/72 | HR 75 | Ht 70.0 in | Wt 218.2 lb

## 2021-07-20 DIAGNOSIS — I7143 Infrarenal abdominal aortic aneurysm, without rupture: Secondary | ICD-10-CM

## 2021-07-20 DIAGNOSIS — E782 Mixed hyperlipidemia: Secondary | ICD-10-CM

## 2021-07-20 DIAGNOSIS — G4733 Obstructive sleep apnea (adult) (pediatric): Secondary | ICD-10-CM | POA: Diagnosis not present

## 2021-07-20 DIAGNOSIS — I4819 Other persistent atrial fibrillation: Secondary | ICD-10-CM | POA: Diagnosis not present

## 2021-07-20 NOTE — Patient Instructions (Signed)
Medication Instructions:  Your physician recommends that you continue on your current medications as directed. Please refer to the Current Medication list given to you today.  *If you need a refill on your cardiac medications before your next appointment, please call your pharmacy*   Lab Work: none If you have labs (blood work) drawn today and your tests are completely normal, you will receive your results only by: [facility] (if you have MyChart) OR A paper copy in the mail If you have any lab test that is abnormal or we need to change your treatment, we will call you to review the results.   Testing/Procedures: Dr [name] recommends you have a Calcium Score CT Scan   Follow-Up: At [facility], you and your health needs are our priority.  As part of our continuing mission to provide you with exceptional heart care, we have created designated Provider Care Teams.  These Care Teams include your primary Cardiologist (physician) and Advanced Practice Providers (APPs -  Physician Assistants and Nurse Practitioners) who all work together to provide you with the care you need, when you need it.  We recommend signing up for the patient portal called "MyChart".  Sign up information is provided on this After Visit Summary.  MyChart is used to connect with patients for Virtual Visits (Telemedicine).  Patients are able to view lab/test results, encounter notes, upcoming appointments, etc.  Non-urgent messages can be sent to your provider as well.   To learn more about what you can do with MyChart, go to [email or web address].    Your next appointment:   12 month(s)  The format for your next appointment:   In Person  Provider:   Larae Grooms, MD     Other Instructions

## 2021-07-26 DIAGNOSIS — J3089 Other allergic rhinitis: Secondary | ICD-10-CM | POA: Diagnosis not present

## 2021-07-26 DIAGNOSIS — J3081 Allergic rhinitis due to animal (cat) (dog) hair and dander: Secondary | ICD-10-CM | POA: Diagnosis not present

## 2021-07-26 DIAGNOSIS — J301 Allergic rhinitis due to pollen: Secondary | ICD-10-CM | POA: Diagnosis not present

## 2021-08-01 DIAGNOSIS — R5383 Other fatigue: Secondary | ICD-10-CM | POA: Diagnosis not present

## 2021-08-01 DIAGNOSIS — Z79899 Other long term (current) drug therapy: Secondary | ICD-10-CM | POA: Diagnosis not present

## 2021-08-01 DIAGNOSIS — Z111 Encounter for screening for respiratory tuberculosis: Secondary | ICD-10-CM | POA: Diagnosis not present

## 2021-08-01 DIAGNOSIS — M0579 Rheumatoid arthritis with rheumatoid factor of multiple sites without organ or systems involvement: Secondary | ICD-10-CM | POA: Diagnosis not present

## 2021-08-02 DIAGNOSIS — J3081 Allergic rhinitis due to animal (cat) (dog) hair and dander: Secondary | ICD-10-CM | POA: Diagnosis not present

## 2021-08-02 DIAGNOSIS — J3089 Other allergic rhinitis: Secondary | ICD-10-CM | POA: Diagnosis not present

## 2021-08-02 DIAGNOSIS — J301 Allergic rhinitis due to pollen: Secondary | ICD-10-CM | POA: Diagnosis not present

## 2021-08-10 DIAGNOSIS — J301 Allergic rhinitis due to pollen: Secondary | ICD-10-CM | POA: Diagnosis not present

## 2021-08-10 DIAGNOSIS — J3081 Allergic rhinitis due to animal (cat) (dog) hair and dander: Secondary | ICD-10-CM | POA: Diagnosis not present

## 2021-08-10 DIAGNOSIS — J3089 Other allergic rhinitis: Secondary | ICD-10-CM | POA: Diagnosis not present

## 2021-08-14 DIAGNOSIS — R5383 Other fatigue: Secondary | ICD-10-CM | POA: Diagnosis not present

## 2021-08-14 DIAGNOSIS — Z1152 Encounter for screening for COVID-19: Secondary | ICD-10-CM | POA: Diagnosis not present

## 2021-08-14 DIAGNOSIS — M069 Rheumatoid arthritis, unspecified: Secondary | ICD-10-CM | POA: Diagnosis not present

## 2021-08-14 DIAGNOSIS — G4733 Obstructive sleep apnea (adult) (pediatric): Secondary | ICD-10-CM | POA: Diagnosis not present

## 2021-08-14 DIAGNOSIS — R0989 Other specified symptoms and signs involving the circulatory and respiratory systems: Secondary | ICD-10-CM | POA: Diagnosis not present

## 2021-08-14 DIAGNOSIS — R051 Acute cough: Secondary | ICD-10-CM | POA: Diagnosis not present

## 2021-08-14 DIAGNOSIS — J302 Other seasonal allergic rhinitis: Secondary | ICD-10-CM | POA: Diagnosis not present

## 2021-08-18 ENCOUNTER — Other Ambulatory Visit: Payer: Self-pay

## 2021-08-18 ENCOUNTER — Ambulatory Visit (INDEPENDENT_AMBULATORY_CARE_PROVIDER_SITE_OTHER)
Admission: RE | Admit: 2021-08-18 | Discharge: 2021-08-18 | Disposition: A | Discharge status: Home / Self Care | Payer: Self-pay | Source: Ambulatory Visit | Attending: Interventional Cardiology | Admitting: Interventional Cardiology

## 2021-08-18 ENCOUNTER — Telehealth: Payer: Self-pay | Admitting: *Deleted

## 2021-08-18 DIAGNOSIS — I4819 Other persistent atrial fibrillation: Secondary | ICD-10-CM

## 2021-08-18 DIAGNOSIS — E782 Mixed hyperlipidemia: Secondary | ICD-10-CM

## 2021-08-18 NOTE — Telephone Encounter (Signed)
-----   Message from Jettie Booze, MD sent at 08/18/2021  3:34 PM EST ----- High calcium score. Refer to pharmD lipid clinic for further eval.  Refer to pulmonary as there is a question of interstitial lung disease by the CT scan.

## 2021-08-18 NOTE — Telephone Encounter (Signed)
Results reviewed with patient. Appointment scheduled in lipid clinic for January 6,2023 at 8:30. Patient reports he has bronchitis and is being treated with prednisone.  He is asking if this could cause findings noted on CT scan and if referral to pulmonary is still needed.

## 2021-08-21 NOTE — Telephone Encounter (Signed)
I don't know.  COuld check with PMD and see if they think that referral is necessary.  JV  Patient notified.  He will follow up with Dr [name].  Results of CT sent to Dr [name]

## 2021-08-30 DIAGNOSIS — J3081 Allergic rhinitis due to animal (cat) (dog) hair and dander: Secondary | ICD-10-CM | POA: Diagnosis not present

## 2021-08-30 DIAGNOSIS — J3089 Other allergic rhinitis: Secondary | ICD-10-CM | POA: Diagnosis not present

## 2021-08-30 DIAGNOSIS — J301 Allergic rhinitis due to pollen: Secondary | ICD-10-CM | POA: Diagnosis not present

## 2021-09-13 DIAGNOSIS — M0579 Rheumatoid arthritis with rheumatoid factor of multiple sites without organ or systems involvement: Secondary | ICD-10-CM | POA: Diagnosis not present

## 2021-09-13 DIAGNOSIS — L409 Psoriasis, unspecified: Secondary | ICD-10-CM | POA: Diagnosis not present

## 2021-09-13 DIAGNOSIS — E669 Obesity, unspecified: Secondary | ICD-10-CM | POA: Diagnosis not present

## 2021-09-13 DIAGNOSIS — Z6831 Body mass index (BMI) 31.0-31.9, adult: Secondary | ICD-10-CM | POA: Diagnosis not present

## 2021-09-13 DIAGNOSIS — Z79899 Other long term (current) drug therapy: Secondary | ICD-10-CM | POA: Diagnosis not present

## 2021-09-13 DIAGNOSIS — M47816 Spondylosis without myelopathy or radiculopathy, lumbar region: Secondary | ICD-10-CM | POA: Diagnosis not present

## 2021-09-14 NOTE — Progress Notes (Addendum)
Patient ID: Frederick Bridges                 DOB: May 31, 1951                    MRN: [id number]     HPI: Frederick Bridges is a 71 y.o. male patient referred to lipid clinic by Frederick Bridges. PMH is significant for afib detected 01/16/21, now s/p cardioversion x2 on 6/13 and 03/10/21, not on anticoag due to low CHADS2VASc score, obstructive sleep apnea, abdominal aortic aneurysm, colonic adenomas, and RA.  At last visit with Frederick Bridges on 11/10, calcium scoring CT scan discussed. Pt has tried atorvastatin in the past, 15-20 years ago, and had muscle pains. Shortly after he developed RA and believed there may have been a correlation. He discussed trying rosuvastatin at a low dose. Recommended follow up with [facility] lipid clinic due to elevated coronary calcium score on 08/18/21 of 2179 (95% percentile for age, race and sex matched controls). Results of CT scan additionally sent to Frederick Bridges for further discussion over possible interstitial lung disease.   At today's visit, pt presents in good spirits. Opened the conversation surrounding statin therapy, and pt reiterated experienced muscle aches and pains gradually over a year since starting an unknown dose of atorvastatin, potentially 10mg . Pt endorses taking various supplements OTC such as red yeast rice 600 mg BID and CoQ10 100 mg daily. Pt is not interested at this time with trying any injectables. Pt endorses wanting to be more active and start walking again.   Current Medications: red yeast rice 600 mg BID  Intolerances: atorvastatin (muscle aches)  Risk Factors: high calcium score 2,176 (95th percentile), CAD  LDL goal: <70  Diet: has a "sweet tooth"  Exercise: Plays golf regularly  Family History: The patient's family history includes Lung cancer in his brother and mother.   Social History: The patient  reports that he quit smoking about 13 years ago. His smoking use included cigarettes. He has never used smokeless tobacco. He reports that he  does not currently use alcohol after a past usage of about 12.0 standard drinks per week. He reports that he does not use drugs.   Labs: 01/09/21: TC 242, TG 118, HDL 45, LDL 173  Past Medical History:  Diagnosis Date   A-fib ([place])    AAA (abdominal aortic aneurysm)    Allergy    hayfever   Colon polyps    Hay fever    Nocturia    Personal history of colonic adenomas 12/22/2012   Rheumatoid arthritis ([name])    Sleep apnea    Dental Device   Wears glasses     Current Outpatient Medications on File Prior to Visit  Medication Sig Dispense Refill   Ascorbic Acid (VITAMIN C) 1000 MG tablet Take 1,000 mg by mouth in the morning.     ASPIRIN 81 PO aspirin     Azelastine HCl 0.15 % SOLN Place 2 sprays into both nostrils at bedtime.     cetirizine (ZYRTEC) 10 MG tablet Take 10 mg by mouth every evening.     CHELATED MAGNESIUM PO Take 100 mg by mouth in the morning and at bedtime.     Cholecalciferol (VITAMIN D-3) 125 MCG (5000 UT) TABS Take 5,000 Units by mouth every evening.     EPINEPHrine 0.3 mg/0.3 mL IJ SOAJ injection Inject 0.3 mg into the muscle as needed for anaphylaxis.     fexofenadine (ALLEGRA) 180 MG tablet  Take 180 mg by mouth in the morning.     flecainide (TAMBOCOR) 100 MG tablet TAKE 1 TABLET BY MOUTH TWICE A DAY 180 tablet 1   fluticasone (FLONASE) 50 MCG/ACT nasal spray Place 1 spray into both nostrils every morning.     golimumab (SIMPONI ARIA) 50 MG/4ML SOLN injection Inject 50 mg into the vein every 8 (eight) weeks.     Green Tea, Camellia sinensis, (GREEN TEA EXTRACT PO) Take 350 mg by mouth every evening.     hydroxychloroquine (PLAQUENIL) 200 MG tablet Take 200 mg by mouth 2 (two) times daily.     Menatetrenone (VITAMIN K2) 100 MCG TABS Take 100 mcg by mouth every evening.     Methylcobalamin 5000 MCG TBDP Take 5,000 mcg by mouth in the morning.     metoprolol succinate (TOPROL-XL) 25 MG 24 hr tablet Take 25 mg by mouth in the morning.     Misc Natural Products  (MIXED TOCOTRIENOLS W/ VITA E PO) Take 1 capsule by mouth in the morning.     Multiple Vitamin (MULTIVITAMIN WITH MINERALS) TABS tablet Take 1 tablet by mouth in the morning.     naproxen sodium (ALEVE) 220 MG tablet Take 660-880 mg by mouth daily as needed (arthritis pain).     NON FORMULARY Inject 1 Dose as directed once a week. Allergy shots     OVER THE COUNTER MEDICATION Take 1 capsule by mouth in the morning. [employer] Curaphen     Red Yeast Rice 600 MG CAPS Take 600 mg by mouth in the morning and at bedtime.     tamsulosin (FLOMAX) 0.4 MG CAPS capsule Take 0.4 mg by mouth at bedtime.     TOCOPHEROLS-TOCOTRIENOLS PO Take 1 capsule by mouth every evening.     Ubiquinol 100 MG CAPS Take 100 mg by mouth in the morning.     Zinc 50 MG TABS Take 50 mg by mouth every evening.     No current facility-administered medications on file prior to visit.    Allergies  Allergen Reactions   Prednisone     Other reaction(s): makes hyper/anxious   Ciprofloxacin Rash and Other (See Comments)    pain   Sulfa Antibiotics Rash    pain    Assessment/Plan:  1. Hyperlipidemia - LDL elevated at 173 above  goal <70 due to high calcium score diagnostic for CAD. Will rechallenge with lower dose of rosuvastatin 10mg  daily (previously intolerant to atorvastatin). Will stop red yeast rice 600mg  BID, advised pt he can continue CoQ10 100 mg daily in case it helps him tolerate his statin better. F/u lipid panel in 8 weeks. Goal to titrate to max tolerated statin dose and then can add on ezetimibe if needed. Pt not interested in injectable medications. Encouraged pt to increase physical activity and focus on heart healthy diet.  Pt reported today that he is no longer on baby ASA (took for a few weeks after a previous procedure). Will forward to Frederick Irish Bridges regarding need to potentially resume in setting of elevated calcium score.  Frederick Bridges [phone] assisted in this visit.  Frederick Bridges,  Bridges, BCACP, [place] [address], [place], [place] [address] Phone: [phone]; Fax: [phone] 09/15/2021 9:19 AM  Addendum: Pt aware to resume ASA 81mg  daily per recommendation from Frederick Irish Bridges.

## 2021-09-15 ENCOUNTER — Other Ambulatory Visit: Payer: Self-pay

## 2021-09-15 ENCOUNTER — Ambulatory Visit (INDEPENDENT_AMBULATORY_CARE_PROVIDER_SITE_OTHER): Payer: Medicare Other | Admitting: Pharmacist

## 2021-09-15 DIAGNOSIS — E785 Hyperlipidemia, unspecified: Secondary | ICD-10-CM | POA: Insufficient documentation

## 2021-09-15 DIAGNOSIS — E782 Mixed hyperlipidemia: Secondary | ICD-10-CM | POA: Diagnosis not present

## 2021-09-15 DIAGNOSIS — R931 Abnormal findings on diagnostic imaging of heart and coronary circulation: Secondary | ICD-10-CM

## 2021-09-15 MED ORDER — ROSUVASTATIN CALCIUM 10 MG PO TABS: 10.0000 mg | ORAL_TABLET | Freq: Every day | ORAL | 3 refills | Status: DC

## 2021-09-15 NOTE — Patient Instructions (Addendum)
It was nice to meet you today!  Your LDL is 173 and your goal is < 70 because of your elevated calcium score  Start taking rosuvastatin (Crestor) 10mg  - 1 tablet once daily  You can stop your CoQ10  I'll reach out to Dr [name] about potentially restarting a baby aspirin  Recheck fasting cholesterol on Wednesday, March 1st any time after 7:30am  Call Jinny Blossom, PharmD with any trouble tolerating your statin [phone]

## 2021-09-18 ENCOUNTER — Encounter: Payer: Self-pay | Admitting: Pulmonary Disease

## 2021-09-18 ENCOUNTER — Other Ambulatory Visit: Payer: Self-pay

## 2021-09-18 ENCOUNTER — Ambulatory Visit (INDEPENDENT_AMBULATORY_CARE_PROVIDER_SITE_OTHER): Payer: Medicare Other | Admitting: Pulmonary Disease

## 2021-09-18 ENCOUNTER — Encounter: Payer: Self-pay | Admitting: Pharmacist

## 2021-09-18 VITALS — BP 122/64 | HR 58 | Temp 97.8°F | Ht 70.0 in | Wt 218.6 lb

## 2021-09-18 DIAGNOSIS — R931 Abnormal findings on diagnostic imaging of heart and coronary circulation: Secondary | ICD-10-CM | POA: Diagnosis not present

## 2021-09-18 DIAGNOSIS — R062 Wheezing: Secondary | ICD-10-CM | POA: Diagnosis not present

## 2021-09-18 DIAGNOSIS — J849 Interstitial pulmonary disease, unspecified: Secondary | ICD-10-CM | POA: Diagnosis not present

## 2021-09-18 NOTE — Patient Instructions (Signed)
I have reviewed your CT scan which shows minimal changes at the lung base.  This may be related to bronchitis but we will evaluate this further as rheumatoid arthritis can cause lung issues  We will schedule high-resolution CT and PFTs We will give a sample of Trelegy inhaler to see if it helps with the wheezing.

## 2021-09-18 NOTE — Telephone Encounter (Signed)
This encounter was created in error - please disregard.

## 2021-09-19 ENCOUNTER — Encounter: Payer: Self-pay | Admitting: Pulmonary Disease

## 2021-09-19 NOTE — Telephone Encounter (Signed)
Okay to resume allergy shots

## 2021-09-19 NOTE — Progress Notes (Addendum)
Frederick Bridges    [id number]    Nov 22, 1950  Primary Care Physician:Paterson, Ermalene Searing, MD  Referring Physician: Donnajean Lopes, MD [address] [place],  [place] [address]  Chief complaint: Consult for interstitial lung disease  HPI: 71 year old with history of rheumatoid arthritis, sleep apnea [on dental device], atrial fibrillation.  Referred here for evaluation of abnormal CT.  He had a CT angiogram done in December which showed subtle changes at the base concerning for interstitial lung disease.    He had episode of bronchitis a few days before his CT and was treated with prednisone as an outpatient.  Tested negative for COVID and flu.  Chest x-ray was normal, States that his breathing is doing well now with no issues.  Denies any cough, dyspnea.  He does have occasional chest tightness with wheezing  He has longstanding rheumatoid arthritis and was on Enbrel.  This was switched to Simponi a few years ago but has worsening joint symptoms on the latter medication.  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  He is negative for other exposures Smoking history: 38-pack-year smoker.  Quit in 2009 Travel history: No significant travel history Relevant family history: Mom and brother had lung cancer.  Outpatient Encounter Medications as of 09/18/2021  Medication Sig   Ascorbic Acid (VITAMIN C) 1000 MG tablet Take 1,000 mg by mouth in the morning.   aspirin EC 81 MG tablet Take 81 mg by mouth daily. Swallow whole.   Azelastine HCl 0.15 % SOLN Place 2 sprays into both nostrils at bedtime.   cetirizine (ZYRTEC) 10 MG tablet Take 10 mg by mouth every evening.   CHELATED MAGNESIUM PO Take 100 mg by mouth in the morning and at bedtime.    Cholecalciferol (VITAMIN D-3) 125 MCG (5000 UT) TABS Take 5,000 Units by mouth every evening.   EPINEPHrine 0.3 mg/0.3 mL IJ SOAJ injection Inject 0.3 mg into the muscle as needed for anaphylaxis.   fexofenadine (ALLEGRA) 180 MG tablet Take 180 mg by mouth in the morning.   flecainide (TAMBOCOR) 100 MG tablet TAKE 1 TABLET BY MOUTH TWICE A DAY   fluticasone (FLONASE) 50 MCG/ACT nasal spray Place 1 spray into both nostrils every morning.   Green Tea, Camellia sinensis, (GREEN TEA EXTRACT PO) Take 350 mg by mouth every evening.   hydroxychloroquine (PLAQUENIL) 200 MG tablet Take 200 mg by mouth 2 (two) times daily.   Menatetrenone (VITAMIN K2) 100 MCG TABS Take 100 mcg by mouth every evening.   Methylcobalamin 5000 MCG TBDP Take 5,000 mcg by mouth in the morning.   metoprolol succinate (TOPROL-XL) 25 MG 24 hr tablet Take 25 mg by mouth in the morning.   Misc Natural Products (MIXED TOCOTRIENOLS W/ VITA E PO) Take 1 capsule by mouth in the morning.   Multiple Vitamin (MULTIVITAMIN WITH MINERALS) TABS tablet Take 1 tablet by mouth in the morning.   NON FORMULARY Inject 1 Dose as directed once a week. Allergy shots   OVER THE COUNTER MEDICATION Take 1 capsule by mouth in the morning. [employer] Curaphen   Quercetin 500 MG CAPS Take 2 capsules by mouth daily.   rosuvastatin (CRESTOR) 10 MG tablet Take 1 tablet (10 mg total) by mouth daily.   tamsulosin (  FLOMAX) 0.4 MG CAPS capsule Take 0.4 mg by mouth at bedtime.   TOCOPHEROLS-TOCOTRIENOLS PO Take 1 capsule by mouth every evening.   Ubiquinol 100 MG CAPS Take 100 mg by mouth in the morning.   Zinc 50 MG TABS Take 50 mg by mouth every evening.   No facility-administered encounter medications on file as of 09/18/2021.   Physical Exam: Blood pressure 122/64, pulse (!) 58, temperature 97.8 F (36.6 C), temperature source Oral, height 5\' 10"  (1.778 m), weight 218 lb 9.6 oz (99.2 kg), SpO2 99 %. Gen:      No acute distress HEENT:  EOMI, sclera  anicteric Neck:     No masses; no thyromegaly Lungs:    Clear to auscultation bilaterally; normal respiratory effort CV:         Regular rate and rhythm; no murmurs Abd:      + bowel sounds; soft, non-tender; no palpable masses, no distension Ext:    No edema; adequate peripheral perfusion Skin:      Warm and dry; no rash Neuro: alert and oriented x 3 Psych: normal mood and affect  Data Reviewed: Imaging: CT cardiac 08/18/2021-visualized lung bases show mild reticulation, groundglass opacities at the base.  I have reviewed the images personally  PFTs:  Labs: Labs from primary care CBC 01/09/2021-WBC 9.93, eos 4.8%, absolute eosinophil count 477  Assessment:  Evaluation for interstitial lung disease CT scan shows minimal changes at the lung base.  This may be related to the episode of bronchitis that he had a few days before the scan.  He will need to be evaluated for RA ILD.  Schedule high-res CT and PFTs  Chest tightness with wheezing May be related to asthma or COPD given history of allergies, peripheral eosinophilia and smoking Review PFTs when available Trial Trelegy inhaler  Plan/Recommendations: High-res CT, PFTs Trelegy  Marshell Garfinkel MD [facility] 09/19/2021, 4:05 PM  CC: Donnajean Lopes, MD

## 2021-09-20 ENCOUNTER — Other Ambulatory Visit (HOSPITAL_COMMUNITY): Payer: Self-pay | Admitting: Physician Assistant

## 2021-09-20 DIAGNOSIS — J301 Allergic rhinitis due to pollen: Secondary | ICD-10-CM | POA: Diagnosis not present

## 2021-09-20 DIAGNOSIS — J3089 Other allergic rhinitis: Secondary | ICD-10-CM | POA: Diagnosis not present

## 2021-09-20 DIAGNOSIS — J3081 Allergic rhinitis due to animal (cat) (dog) hair and dander: Secondary | ICD-10-CM | POA: Diagnosis not present

## 2021-09-27 DIAGNOSIS — J3081 Allergic rhinitis due to animal (cat) (dog) hair and dander: Secondary | ICD-10-CM | POA: Diagnosis not present

## 2021-09-27 DIAGNOSIS — J301 Allergic rhinitis due to pollen: Secondary | ICD-10-CM | POA: Diagnosis not present

## 2021-09-27 DIAGNOSIS — J3089 Other allergic rhinitis: Secondary | ICD-10-CM | POA: Diagnosis not present

## 2021-10-03 DIAGNOSIS — J3089 Other allergic rhinitis: Secondary | ICD-10-CM | POA: Diagnosis not present

## 2021-10-03 DIAGNOSIS — J301 Allergic rhinitis due to pollen: Secondary | ICD-10-CM | POA: Diagnosis not present

## 2021-10-03 DIAGNOSIS — J3081 Allergic rhinitis due to animal (cat) (dog) hair and dander: Secondary | ICD-10-CM | POA: Diagnosis not present

## 2021-10-10 DIAGNOSIS — J3081 Allergic rhinitis due to animal (cat) (dog) hair and dander: Secondary | ICD-10-CM | POA: Diagnosis not present

## 2021-10-10 DIAGNOSIS — J301 Allergic rhinitis due to pollen: Secondary | ICD-10-CM | POA: Diagnosis not present

## 2021-10-10 DIAGNOSIS — J3089 Other allergic rhinitis: Secondary | ICD-10-CM | POA: Diagnosis not present

## 2021-10-11 DIAGNOSIS — J3089 Other allergic rhinitis: Secondary | ICD-10-CM | POA: Diagnosis not present

## 2021-10-11 DIAGNOSIS — J301 Allergic rhinitis due to pollen: Secondary | ICD-10-CM | POA: Diagnosis not present

## 2021-10-11 DIAGNOSIS — J3081 Allergic rhinitis due to animal (cat) (dog) hair and dander: Secondary | ICD-10-CM | POA: Diagnosis not present

## 2021-10-16 ENCOUNTER — Other Ambulatory Visit: Payer: Self-pay

## 2021-10-16 ENCOUNTER — Ambulatory Visit
Admission: RE | Admit: 2021-10-16 | Discharge: 2021-10-16 | Disposition: A | Discharge status: Home / Self Care | Payer: Medicare Other | Source: Ambulatory Visit | Attending: Pulmonary Disease | Admitting: Pulmonary Disease

## 2021-10-16 DIAGNOSIS — J849 Interstitial pulmonary disease, unspecified: Secondary | ICD-10-CM

## 2021-10-16 DIAGNOSIS — I2510 Atherosclerotic heart disease of native coronary artery without angina pectoris: Secondary | ICD-10-CM | POA: Diagnosis not present

## 2021-10-16 DIAGNOSIS — J439 Emphysema, unspecified: Secondary | ICD-10-CM | POA: Diagnosis not present

## 2021-10-16 DIAGNOSIS — J8410 Pulmonary fibrosis, unspecified: Secondary | ICD-10-CM | POA: Diagnosis not present

## 2021-10-17 ENCOUNTER — Other Ambulatory Visit: Payer: Self-pay | Admitting: Pulmonary Disease

## 2021-10-17 DIAGNOSIS — J301 Allergic rhinitis due to pollen: Secondary | ICD-10-CM | POA: Diagnosis not present

## 2021-10-17 DIAGNOSIS — J3081 Allergic rhinitis due to animal (cat) (dog) hair and dander: Secondary | ICD-10-CM | POA: Diagnosis not present

## 2021-10-17 DIAGNOSIS — J3089 Other allergic rhinitis: Secondary | ICD-10-CM | POA: Diagnosis not present

## 2021-10-18 LAB — SARS CORONAVIRUS 2 (TAT 6-24 HRS): SARS Coronavirus 2: NEGATIVE

## 2021-10-20 ENCOUNTER — Ambulatory Visit (INDEPENDENT_AMBULATORY_CARE_PROVIDER_SITE_OTHER): Payer: Medicare Other | Admitting: Pulmonary Disease

## 2021-10-20 ENCOUNTER — Other Ambulatory Visit: Payer: Self-pay

## 2021-10-20 ENCOUNTER — Encounter: Payer: Self-pay | Admitting: Pulmonary Disease

## 2021-10-20 VITALS — BP 130/64 | HR 66 | Temp 97.8°F | Ht 70.0 in | Wt 216.0 lb

## 2021-10-20 DIAGNOSIS — R931 Abnormal findings on diagnostic imaging of heart and coronary circulation: Secondary | ICD-10-CM

## 2021-10-20 DIAGNOSIS — R911 Solitary pulmonary nodule: Secondary | ICD-10-CM | POA: Diagnosis not present

## 2021-10-20 DIAGNOSIS — J849 Interstitial pulmonary disease, unspecified: Secondary | ICD-10-CM

## 2021-10-20 LAB — PULMONARY FUNCTION TEST
DL/VA % pred: 103 %
DL/VA: 4.21 ml/min/mmHg/L
DLCO cor % pred: 83 %
DLCO cor: 21.89 ml/min/mmHg
DLCO unc % pred: 83 %
DLCO unc: 21.89 ml/min/mmHg
FEF 25-75 Post: 3.85 L/sec
FEF 25-75 Pre: 2.19 L/sec
FEF2575-%Change-Post: 75 %
FEF2575-%Pred-Post: 152 %
FEF2575-%Pred-Pre: 86 %
FEV1-%Change-Post: 8 %
FEV1-%Pred-Post: 80 %
FEV1-%Pred-Pre: 73 %
FEV1-Post: 2.64 L
FEV1-Pre: 2.42 L
FEV1FVC-%Change-Post: 1 %
FEV1FVC-%Pred-Pre: 109 %
FEV6-%Change-Post: 5 %
FEV6-%Pred-Post: 74 %
FEV6-%Pred-Pre: 70 %
FEV6-Post: 3.15 L
FEV6-Pre: 2.99 L
FEV6FVC-%Pred-Post: 105 %
FEV6FVC-%Pred-Pre: 105 %
FVC-%Change-Post: 6 %
FVC-%Pred-Post: 71 %
FVC-%Pred-Pre: 67 %
FVC-Post: 3.21 L
FVC-Pre: 3 L
Post FEV1/FVC ratio: 82 %
Post FEV6/FVC ratio: 100 %
Pre FEV1/FVC ratio: 81 %
Pre FEV6/FVC Ratio: 100 %
RV % pred: 98 %
RV: 2.4 L
TLC % pred: 82 %
TLC: 5.76 L

## 2021-10-20 NOTE — Progress Notes (Signed)
Full PFT performed today. °

## 2021-10-20 NOTE — Patient Instructions (Signed)
I reviewed the CT which does not show interstitial lung disease Lung function tests are normal We will get follow-up CT chest without contrast in 3 months for evaluation of lung nodule Follow-up in clinic in 3 months after scan

## 2021-10-20 NOTE — Patient Instructions (Signed)
Full PFT performed today. °

## 2021-10-20 NOTE — Progress Notes (Signed)
Frederick Bridges    [id number]    [birth year]  Primary Care Physician:Paterson, Ermalene Searing, MD  Referring Physician: Donnajean Lopes, MD [address] [place],  [place] [address]  Chief complaint: Consult for interstitial lung disease  HPI: 71 year old with history of rheumatoid arthritis, sleep apnea [on dental device], atrial fibrillation.  Referred here for evaluation of abnormal CT.  He had a CT angiogram done in December which showed subtle changes at the base concerning for interstitial lung disease.    He had episode of bronchitis a few days before his CT and was treated with prednisone as an outpatient.  Tested negative for COVID and flu.  Chest x-ray was normal, States that his breathing is doing well now with no issues.  Denies any cough, dyspnea.  He does have occasional chest tightness with wheezing  He has longstanding rheumatoid arthritis and was on Enbrel.  This was switched to Simponi a few years ago but has worsening joint symptoms on the latter medication.  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  He is negative for other exposures Smoking history: 38-pack-year smoker.  Quit in 2009 Travel history: No significant travel history Relevant family history: Mom and brother had lung cancer.  Interim history: Here for review of CT and PFTs States that breathing is doing well with no issues  Outpatient Encounter Medications as of 10/20/2021  Medication Sig   Ascorbic Acid (VITAMIN C) 1000 MG tablet Take 1,000 mg by mouth in the morning.   aspirin EC 81 MG tablet Take 81 mg by mouth daily. Swallow whole.   Azelastine HCl 0.15 % SOLN Place 2 sprays into both nostrils at bedtime.   cetirizine (ZYRTEC) 10 MG tablet Take 10 mg by  mouth every evening.   CHELATED MAGNESIUM PO Take 100 mg by mouth in the morning and at bedtime.   Cholecalciferol (VITAMIN D-3) 125 MCG (5000 UT) TABS Take 5,000 Units by mouth every evening.   ENBREL SURECLICK 50 MG/ML injection Inject into the skin.   EPINEPHrine 0.3 mg/0.3 mL IJ SOAJ injection Inject 0.3 mg into the muscle as needed for anaphylaxis.   fexofenadine (ALLEGRA) 180 MG tablet Take 180 mg by mouth in the morning.   flecainide (TAMBOCOR) 100 MG tablet TAKE 1 TABLET BY MOUTH TWICE A DAY   fluticasone (FLONASE) 50 MCG/ACT nasal spray Place 1 spray into both nostrils every morning.   Green Tea, Camellia sinensis, (GREEN TEA EXTRACT PO) Take 350 mg by mouth every evening.   hydroxychloroquine (PLAQUENIL) 200 MG tablet Take 200 mg by mouth 2 (two) times daily.   Menatetrenone (VITAMIN K2) 100 MCG TABS Take 100 mcg by mouth every evening.   Methylcobalamin 5000 MCG TBDP Take 5,000 mcg by mouth in the morning.   metoprolol succinate (TOPROL-XL) 25 MG 24 hr tablet Take 25 mg by mouth in the morning.   Misc Natural Products (MIXED TOCOTRIENOLS W/ VITA E PO) Take 1 capsule by mouth in the morning.   Multiple Vitamin (MULTIVITAMIN WITH MINERALS) TABS tablet Take 1 tablet by mouth in the morning.   NON FORMULARY Inject 1 Dose as directed once a week. Allergy shots   OVER THE COUNTER MEDICATION Take 1 capsule by mouth in the morning. [employer]  Quercetin 500 MG CAPS Take 2 capsules by mouth daily.   rosuvastatin (CRESTOR) 10 MG tablet Take 1 tablet (10 mg total) by mouth daily.   tamsulosin (FLOMAX) 0.4 MG CAPS capsule Take 0.4 mg by mouth at bedtime.   TOCOPHEROLS-TOCOTRIENOLS PO Take 1 capsule by mouth every evening.   Ubiquinol 100 MG CAPS Take 100 mg by mouth in the morning.   Zinc 50 MG TABS Take 50 mg by mouth every evening.   No facility-administered encounter medications on file as of 10/20/2021.   Physical Exam: Blood pressure 130/64, pulse 66, temperature 97.8 F (36.6  C), temperature source Oral, height 5\' 10"  (1.778 m), weight 216 lb (98 kg), SpO2 97 %. Gen:      No acute distress HEENT:  EOMI, sclera anicteric Neck:     No masses; no thyromegaly Lungs:    Clear to auscultation bilaterally; normal respiratory effort CV:         Regular rate and rhythm; no murmurs Abd:      + bowel sounds; soft, non-tender; no palpable masses, no distension Ext:    No edema; adequate peripheral perfusion Skin:      Warm and dry; no rash Neuro: alert and oriented x 3 Psych: normal mood and affect   Data Reviewed: Imaging: CT cardiac 08/18/2021-visualized lung bases show mild reticulation, groundglass opacities at the base.  CT high-resolution 10/16/2021-mild groundglass reticulation in the lower lobes, mild trapping, clustered nodularity in the right middle lobe, emphysema I have reviewed the images personally.  PFTs: 10/20/2021 FVC 3.21 [71%], FEV1 2.64 [80%], F/F 82, TLC 5.76 [82%], DLCO 21.89 [83%] Normal test  Labs: Labs from primary care CBC 01/09/2021-WBC 9.93, eos 4.8%, absolute eosinophil count 477  Assessment:  Evaluation for interstitial lung disease CT scan shows minimal changes at the lung base.  Not totally convincing for interstitial lung disease, PFTs are normal He will need monitoring as he has rheumatoid arthritis  Nodularity in the right middle lobe Order follow-up CT in 3 months  Plan/Recommendations: CT in 3 months  Marshell Garfinkel MD [facility] 10/20/2021, 3:23 PM  CC: Donnajean Lopes, MD

## 2021-10-25 DIAGNOSIS — J301 Allergic rhinitis due to pollen: Secondary | ICD-10-CM | POA: Diagnosis not present

## 2021-10-25 DIAGNOSIS — G4733 Obstructive sleep apnea (adult) (pediatric): Secondary | ICD-10-CM | POA: Diagnosis not present

## 2021-10-25 DIAGNOSIS — J3089 Other allergic rhinitis: Secondary | ICD-10-CM | POA: Diagnosis not present

## 2021-10-25 DIAGNOSIS — J3081 Allergic rhinitis due to animal (cat) (dog) hair and dander: Secondary | ICD-10-CM | POA: Diagnosis not present

## 2021-11-03 DIAGNOSIS — J301 Allergic rhinitis due to pollen: Secondary | ICD-10-CM | POA: Diagnosis not present

## 2021-11-03 DIAGNOSIS — J3089 Other allergic rhinitis: Secondary | ICD-10-CM | POA: Diagnosis not present

## 2021-11-03 DIAGNOSIS — J3081 Allergic rhinitis due to animal (cat) (dog) hair and dander: Secondary | ICD-10-CM | POA: Diagnosis not present

## 2021-11-06 ENCOUNTER — Other Ambulatory Visit: Payer: Self-pay

## 2021-11-06 ENCOUNTER — Ambulatory Visit (HOSPITAL_COMMUNITY)
Admission: RE | Admit: 2021-11-06 | Discharge: 2021-11-06 | Disposition: A | Discharge status: Home / Self Care | Payer: Medicare Other | Source: Ambulatory Visit | Attending: Physician Assistant | Admitting: Physician Assistant

## 2021-11-06 VITALS — BP 128/66 | HR 64 | Ht 70.0 in | Wt 218.4 lb

## 2021-11-06 DIAGNOSIS — F1090 Alcohol use, unspecified, uncomplicated: Secondary | ICD-10-CM | POA: Diagnosis not present

## 2021-11-06 DIAGNOSIS — Z6831 Body mass index (BMI) 31.0-31.9, adult: Secondary | ICD-10-CM | POA: Diagnosis not present

## 2021-11-06 DIAGNOSIS — E669 Obesity, unspecified: Secondary | ICD-10-CM | POA: Insufficient documentation

## 2021-11-06 DIAGNOSIS — G4733 Obstructive sleep apnea (adult) (pediatric): Secondary | ICD-10-CM | POA: Diagnosis not present

## 2021-11-06 DIAGNOSIS — Z79899 Other long term (current) drug therapy: Secondary | ICD-10-CM | POA: Diagnosis not present

## 2021-11-06 DIAGNOSIS — M069 Rheumatoid arthritis, unspecified: Secondary | ICD-10-CM | POA: Diagnosis not present

## 2021-11-06 DIAGNOSIS — Z8679 Personal history of other diseases of the circulatory system: Secondary | ICD-10-CM | POA: Diagnosis not present

## 2021-11-06 DIAGNOSIS — I4819 Other persistent atrial fibrillation: Secondary | ICD-10-CM | POA: Insufficient documentation

## 2021-11-06 DIAGNOSIS — Z7182 Exercise counseling: Secondary | ICD-10-CM | POA: Diagnosis not present

## 2021-11-06 NOTE — Progress Notes (Signed)
Primary Care Physician: Donnajean Lopes, MD Primary Cardiologist: Dr [name]  Primary Electrophysiologist: none Referring Physician: Dr Gwynneth Macleod is a 71 y.o. male with a history of AAA, RA, OSA, and atrial fibrillation who presents for follow up in the [facility].  The patient was initially diagnosed with atrial fibrillation 01/2021 after presenting to his PCP for routine follow up. He is unaware of his arrhythmia and rate controlled. Patient is on Eliquis for a CHADS2VASC score of 1. He underwent DCCV on 02/20/21 which was unsuccessful. He does consume alcohol. He has an oral device for his OSA. Patient is s/p DCCV on 03/10/21.   On follow up today, patient reports that he has done very well since his last visit. He denies any heart racing or palpitations. Tolerating the medication without difficulty.   Today, he denies symptoms of palpitations, chest pain, shortness of breath, orthopnea, PND, lower extremity edema, dizziness, presyncope, syncope, bleeding, or neurologic sequela. The patient is tolerating medications without difficulties and is otherwise without complaint today.    Atrial Fibrillation Risk Factors:  he does have symptoms or diagnosis of sleep apnea. he is compliant with oral device for OSA. he does not have a history of rheumatic fever. he does have a history of alcohol use. The patient does not have a history of early familial atrial fibrillation or other arrhythmias.  he has a BMI of Body mass index is 31.34 kg/m.Marland Kitchen Filed Weights   11/06/21 0823  Weight: 99.1 kg      Family History  Problem Relation Age of Onset   Lung cancer Mother    Lung cancer Brother    Colon cancer Neg Hx    Pancreatic cancer Neg Hx    Rectal cancer Neg Hx    Stomach cancer Neg Hx    Colon polyps Neg Hx    Esophageal cancer Neg Hx      Atrial Fibrillation Management history:  Previous antiarrhythmic drugs: flecainide Previous  cardioversions: 02/20/21, 03/10/21 Previous ablations: none CHADS2VASC score: 1 Anticoagulation history: Eliquis   Past Medical History:  Diagnosis Date   A-fib ([place])    AAA (abdominal aortic aneurysm)    Allergy    hayfever   Colon polyps    Hay fever    Nocturia    Personal history of colonic adenomas 12/22/2012   Rheumatoid arthritis ([place])    Sleep apnea    Dental Device   Wears glasses    Past Surgical History:  Procedure Laterality Date   CARDIOVERSION N/A 02/20/2021   Procedure: CARDIOVERSION;  Surgeon: Sueanne Margarita, MD;  Location: [place] [facility];  Service: Cardiovascular;  Laterality: N/A;   CARDIOVERSION N/A 03/10/2021   Procedure: CARDIOVERSION;  Surgeon: Geralynn Rile, MD;  Location: [facility];  Service: Cardiovascular;  Laterality: N/A;   COLONOSCOPY  multiple   INGUINAL HERNIA REPAIR     PARATHYROIDECTOMY N/A 09/02/2015   Procedure: PARATHYROIDECTOMY;  Surgeon: Armandina Gemma, MD;  Location: [facility];  Service: General;  Laterality: N/A;   TONSILLECTOMY     as a child    Current Outpatient Medications  Medication Sig Dispense Refill   Ascorbic Acid (VITAMIN C) 1000 MG tablet Take 1,000 mg by mouth in the morning.     aspirin EC 81 MG tablet Take 81 mg by mouth daily. Swallow whole.     Azelastine HCl 0.15 % SOLN Place 2 sprays into both nostrils at bedtime.     cetirizine (  ZYRTEC) 10 MG tablet Take 10 mg by mouth every evening.     CHELATED MAGNESIUM PO Take 100 mg by mouth in the morning and at bedtime.     Cholecalciferol (VITAMIN D-3) 125 MCG (5000 UT) TABS Take 5,000 Units by mouth every evening.     ENBREL SURECLICK 50 MG/ML injection Inject 50 mg into the skin once a week.     EPINEPHrine 0.3 mg/0.3 mL IJ SOAJ injection Inject 0.3 mg into the muscle as needed for anaphylaxis.     fexofenadine (ALLEGRA) 180 MG tablet Take 180 mg by mouth in the morning.     flecainide (TAMBOCOR) 100 MG tablet TAKE 1 TABLET BY MOUTH TWICE A DAY 180 tablet 1   fluticasone  (FLONASE) 50 MCG/ACT nasal spray Place 1 spray into both nostrils every morning.     Green Tea, Camellia sinensis, (GREEN TEA EXTRACT PO) Take 350 mg by mouth every evening.     hydroxychloroquine (PLAQUENIL) 200 MG tablet Take 200 mg by mouth 2 (two) times daily.     Menatetrenone (VITAMIN K2) 100 MCG TABS Take 100 mcg by mouth every evening.     Methylcobalamin 5000 MCG TBDP Take 5,000 mcg by mouth in the morning.     metoprolol succinate (TOPROL-XL) 25 MG 24 hr tablet Take 25 mg by mouth in the morning.     Misc Natural Products (MIXED TOCOTRIENOLS W/ VITA E PO) Take 1 capsule by mouth in the morning.     montelukast (SINGULAIR) 10 MG tablet Take 10 mg by mouth daily.     Multiple Vitamin (MULTIVITAMIN WITH MINERALS) TABS tablet Take 1 tablet by mouth in the morning.     NON FORMULARY Inject 1 Dose as directed once a week. Allergy shots     Quercetin 500 MG CAPS Take 2 capsules by mouth daily.     rosuvastatin (CRESTOR) 10 MG tablet Take 1 tablet (10 mg total) by mouth daily. 90 tablet 3   tamsulosin (FLOMAX) 0.4 MG CAPS capsule Take 0.4 mg by mouth at bedtime.     TOCOPHEROLS-TOCOTRIENOLS PO Take 1 capsule by mouth every evening.     Ubiquinol 100 MG CAPS Take 100 mg by mouth in the morning.     Zinc 50 MG TABS Take 50 mg by mouth every evening.     No current facility-administered medications for this encounter.    Allergies  Allergen Reactions   Prednisone     Other reaction(s): makes hyper/anxious   Ciprofloxacin Rash and Other (See Comments)    pain   Sulfa Antibiotics Rash    pain    Social History   Socioeconomic History   Marital status: Married    Spouse name: Not on file   Number of children: Not on file   Years of education: Not on file   Highest education level: Not on file  Occupational History   Occupation: retired    Fish farm manager: [employer]  Tobacco Use   Smoking status: Former    Types: Cigarettes    Quit date: 07/10/2008    Years since  quitting: 13.3   Smokeless tobacco: Never   Tobacco comments:    Former smoker 05/03/2021  Vaping Use   Vaping Use: Never used  Substance and Sexual Activity   Alcohol use: Not Currently    Alcohol/week: 12.0 standard drinks    Types: 12 Cans of beer per week    Comment: 12 cans per week/1-5beers per sitting   Drug use: No  Sexual activity: Not on file  Other Topics Concern   Not on file  Social History Narrative   Left handed    Social Determinants of Health   Financial Resource Strain: Not on file  Food Insecurity: Not on file  Transportation Needs: Not on file  Physical Activity: Not on file  Stress: Not on file  Social Connections: Not on file  Intimate Partner Violence: Not on file     ROS- All systems are reviewed and negative except as per the HPI above.  Physical Exam: Vitals:   11/06/21 0823  BP: 128/66  Pulse: 64  Weight: 99.1 kg  Height: 5\' 10"  (1.778 m)    GEN- The patient is a well appearing obese male, alert and oriented x 3 today.   HEENT-head normocephalic, atraumatic, sclera clear, conjunctiva pink, hearing intact, trachea midline. Lungs- Clear to ausculation bilaterally, normal work of breathing Heart- Regular rate and rhythm, no murmurs, rubs or gallops  GI- soft, NT, ND, + BS Extremities- no clubbing, cyanosis, or edema MS- no significant deformity or atrophy Skin- no rash or lesion Psych- euthymic mood, full affect Neuro- strength and sensation are intact   Wt Readings from Last 3 Encounters:  11/06/21 99.1 kg  10/20/21 98 kg  09/18/21 99.2 kg    EKG today demonstrates  SR Vent. rate 64 BPM PR interval 164 ms QRS duration 98 ms QT/QTcB 410/422 ms  Echo 02/13/21 demonstrated  1. The mitral valve is grossly normal. Moderate mitral valve  regurgitation- mechanism appears to be atrial functional; there is  systolic blunting of the pulmonary vein flow. No evidence of mitral  stenosis.   2. Left atrial size was severely dilated.    3. Left ventricular ejection fraction, by estimation, is 50 to 55%. The  left ventricle has low normal function. The left ventricle has no regional wall motion abnormalities. Left ventricular diastolic parameters are indeterminate.   4. Right ventricular systolic function is normal. The right ventricular  size is normal. Tricuspid regurgitation signal is inadequate for assessing PA pressure.   5. Right atrial size was mildly dilated.   6. The aortic valve is tricuspid. There is mild calcification of the  aortic valve. There is mild thickening of the aortic valve. Aortic valve  regurgitation is not visualized. No aortic stenosis is present.   7. Aortic dilatation noted. There is mild dilatation of the aortic root,  measuring 41 mm.   8. The inferior vena cava is normal in size with greater than 50%  respiratory variability, suggesting right atrial pressure of 3 mmHg.   Epic records are reviewed at length today  CHA2DS2-VASc Score = 1  The patient's score is based upon: CHF History: 0 HTN History: 0 Diabetes History: 0 Stroke History: 0 Vascular Disease History: 0 Age Score: 1 Gender Score: 0      ASSESSMENT AND PLAN: 1. Persistent Atrial Fibrillation (ICD10:  I48.19) The patient's CHA2DS2-VASc score is 1, indicating a 0.6% annual risk of stroke.   Patient appears to be maintaining SR. Continue flecainide 100 mg BID Not currently on anticoagulation with low CV score. Continue Toprol 25 mg daily  2. Obesity Body mass index is 31.34 kg/m. Lifestyle modification was discussed and encouraged including regular physical activity and weight reduction.  3. Obstructive sleep apnea Encouraged compliance with oral device.   Follow up in the AF clinic in 6 months.    [facility] [address] [place], [place] [address] [phone]  11/06/2021 8:57 AM

## 2021-11-07 DIAGNOSIS — M8580 Other specified disorders of bone density and structure, unspecified site: Secondary | ICD-10-CM | POA: Diagnosis not present

## 2021-11-07 DIAGNOSIS — E785 Hyperlipidemia, unspecified: Secondary | ICD-10-CM | POA: Diagnosis not present

## 2021-11-07 DIAGNOSIS — I10 Essential (primary) hypertension: Secondary | ICD-10-CM | POA: Diagnosis not present

## 2021-11-08 ENCOUNTER — Other Ambulatory Visit: Payer: Self-pay

## 2021-11-08 ENCOUNTER — Other Ambulatory Visit: Payer: Medicare Other | Admitting: *Deleted

## 2021-11-08 DIAGNOSIS — E782 Mixed hyperlipidemia: Secondary | ICD-10-CM

## 2021-11-08 LAB — LIPID PANEL
Chol/HDL Ratio: 4.7 ratio (ref 0.0–5.0)
Cholesterol, Total: 164 mg/dL (ref 100–199)
HDL: 35 mg/dL — ABNORMAL LOW (ref >39)
LDL Chol Calc (NIH): 102 mg/dL — ABNORMAL HIGH (ref 0–99)
Triglycerides: 152 mg/dL — ABNORMAL HIGH (ref 0–149)
VLDL Cholesterol Cal: 27 mg/dL (ref 5–40)

## 2021-11-08 LAB — HEPATIC FUNCTION PANEL
ALT: 24 IU/L (ref 0–44)
AST: 29 IU/L (ref 0–40)
Albumin: 4.3 g/dL (ref 3.8–4.8)
Alkaline Phosphatase: 78 IU/L (ref 44–121)
Bilirubin Total: 0.5 mg/dL (ref 0.0–1.2)
Bilirubin, Direct: 0.16 mg/dL (ref 0.00–0.40)
Total Protein: 7 g/dL (ref 6.0–8.5)

## 2021-11-09 ENCOUNTER — Telehealth: Payer: Self-pay | Admitting: Pharmacist

## 2021-11-09 DIAGNOSIS — J301 Allergic rhinitis due to pollen: Secondary | ICD-10-CM | POA: Diagnosis not present

## 2021-11-09 DIAGNOSIS — J3089 Other allergic rhinitis: Secondary | ICD-10-CM | POA: Diagnosis not present

## 2021-11-09 DIAGNOSIS — E782 Mixed hyperlipidemia: Secondary | ICD-10-CM

## 2021-11-09 DIAGNOSIS — R931 Abnormal findings on diagnostic imaging of heart and coronary circulation: Secondary | ICD-10-CM

## 2021-11-09 DIAGNOSIS — J3081 Allergic rhinitis due to animal (cat) (dog) hair and dander: Secondary | ICD-10-CM | POA: Diagnosis not present

## 2021-11-09 DIAGNOSIS — M1712 Unilateral primary osteoarthritis, left knee: Secondary | ICD-10-CM | POA: Diagnosis not present

## 2021-11-09 MED ORDER — ROSUVASTATIN CALCIUM 20 MG PO TABS: 20.0000 mg | ORAL_TABLET | Freq: Every day | ORAL | 3 refills | Status: DC

## 2021-11-09 NOTE — Telephone Encounter (Signed)
Called pt to discuss lipid results, LDL has improved from baseline of 173 to 102 after starting rosuvastatin 10mg  daily. Previously intolerant to atorvastatin (myalgias) which he took 15-20 years ago. He reports tolerating rosuvastatin well. Goal LDL is < 70 given very elevated calcium score > 2,000. Called pt to discuss increasing rosuvastatin dose or adding Zetia, previously not interested in injectable therapy. He does not want to add any new medication but is agreeable to try a slightly higher dose of rosuvastatin 20mg  daily. New rx sent in, f/u labs scheduled in 3 months.  ?

## 2021-11-10 DIAGNOSIS — Z20822 Contact with and (suspected) exposure to covid-19: Secondary | ICD-10-CM | POA: Diagnosis not present

## 2021-11-16 DIAGNOSIS — J3081 Allergic rhinitis due to animal (cat) (dog) hair and dander: Secondary | ICD-10-CM | POA: Diagnosis not present

## 2021-11-16 DIAGNOSIS — J3089 Other allergic rhinitis: Secondary | ICD-10-CM | POA: Diagnosis not present

## 2021-11-16 DIAGNOSIS — J301 Allergic rhinitis due to pollen: Secondary | ICD-10-CM | POA: Diagnosis not present

## 2021-11-19 ENCOUNTER — Other Ambulatory Visit (HOSPITAL_COMMUNITY): Payer: Self-pay | Admitting: Physician Assistant

## 2021-11-23 DIAGNOSIS — J3089 Other allergic rhinitis: Secondary | ICD-10-CM | POA: Diagnosis not present

## 2021-11-23 DIAGNOSIS — J3081 Allergic rhinitis due to animal (cat) (dog) hair and dander: Secondary | ICD-10-CM | POA: Diagnosis not present

## 2021-11-23 DIAGNOSIS — J301 Allergic rhinitis due to pollen: Secondary | ICD-10-CM | POA: Diagnosis not present

## 2021-11-29 DIAGNOSIS — Z20822 Contact with and (suspected) exposure to covid-19: Secondary | ICD-10-CM | POA: Diagnosis not present

## 2021-11-30 DIAGNOSIS — J3089 Other allergic rhinitis: Secondary | ICD-10-CM | POA: Diagnosis not present

## 2021-11-30 DIAGNOSIS — J3081 Allergic rhinitis due to animal (cat) (dog) hair and dander: Secondary | ICD-10-CM | POA: Diagnosis not present

## 2021-11-30 DIAGNOSIS — J301 Allergic rhinitis due to pollen: Secondary | ICD-10-CM | POA: Diagnosis not present

## 2021-12-07 DIAGNOSIS — J3081 Allergic rhinitis due to animal (cat) (dog) hair and dander: Secondary | ICD-10-CM | POA: Diagnosis not present

## 2021-12-07 DIAGNOSIS — J301 Allergic rhinitis due to pollen: Secondary | ICD-10-CM | POA: Diagnosis not present

## 2021-12-07 DIAGNOSIS — J3089 Other allergic rhinitis: Secondary | ICD-10-CM | POA: Diagnosis not present

## 2021-12-13 DIAGNOSIS — J3089 Other allergic rhinitis: Secondary | ICD-10-CM | POA: Diagnosis not present

## 2021-12-13 DIAGNOSIS — J3081 Allergic rhinitis due to animal (cat) (dog) hair and dander: Secondary | ICD-10-CM | POA: Diagnosis not present

## 2021-12-13 DIAGNOSIS — J301 Allergic rhinitis due to pollen: Secondary | ICD-10-CM | POA: Diagnosis not present

## 2021-12-18 DIAGNOSIS — E669 Obesity, unspecified: Secondary | ICD-10-CM | POA: Diagnosis not present

## 2021-12-18 DIAGNOSIS — M0579 Rheumatoid arthritis with rheumatoid factor of multiple sites without organ or systems involvement: Secondary | ICD-10-CM | POA: Diagnosis not present

## 2021-12-18 DIAGNOSIS — M47816 Spondylosis without myelopathy or radiculopathy, lumbar region: Secondary | ICD-10-CM | POA: Diagnosis not present

## 2021-12-18 DIAGNOSIS — Z79899 Other long term (current) drug therapy: Secondary | ICD-10-CM | POA: Diagnosis not present

## 2021-12-18 DIAGNOSIS — Z6831 Body mass index (BMI) 31.0-31.9, adult: Secondary | ICD-10-CM | POA: Diagnosis not present

## 2021-12-18 DIAGNOSIS — L409 Psoriasis, unspecified: Secondary | ICD-10-CM | POA: Diagnosis not present

## 2021-12-21 DIAGNOSIS — J3089 Other allergic rhinitis: Secondary | ICD-10-CM | POA: Diagnosis not present

## 2021-12-21 DIAGNOSIS — J3081 Allergic rhinitis due to animal (cat) (dog) hair and dander: Secondary | ICD-10-CM | POA: Diagnosis not present

## 2021-12-21 DIAGNOSIS — J301 Allergic rhinitis due to pollen: Secondary | ICD-10-CM | POA: Diagnosis not present

## 2021-12-21 DIAGNOSIS — J300 Vasomotor rhinitis: Secondary | ICD-10-CM | POA: Diagnosis not present

## 2021-12-28 DIAGNOSIS — J3081 Allergic rhinitis due to animal (cat) (dog) hair and dander: Secondary | ICD-10-CM | POA: Diagnosis not present

## 2021-12-28 DIAGNOSIS — J3089 Other allergic rhinitis: Secondary | ICD-10-CM | POA: Diagnosis not present

## 2021-12-28 DIAGNOSIS — J301 Allergic rhinitis due to pollen: Secondary | ICD-10-CM | POA: Diagnosis not present

## 2022-01-02 DIAGNOSIS — Z20822 Contact with and (suspected) exposure to covid-19: Secondary | ICD-10-CM | POA: Diagnosis not present

## 2022-01-02 DIAGNOSIS — R361 Hematospermia: Secondary | ICD-10-CM | POA: Diagnosis not present

## 2022-01-03 DIAGNOSIS — J301 Allergic rhinitis due to pollen: Secondary | ICD-10-CM | POA: Diagnosis not present

## 2022-01-03 DIAGNOSIS — J3081 Allergic rhinitis due to animal (cat) (dog) hair and dander: Secondary | ICD-10-CM | POA: Diagnosis not present

## 2022-01-03 DIAGNOSIS — J3089 Other allergic rhinitis: Secondary | ICD-10-CM | POA: Diagnosis not present

## 2022-01-06 DIAGNOSIS — Z20822 Contact with and (suspected) exposure to covid-19: Secondary | ICD-10-CM | POA: Diagnosis not present

## 2022-01-11 DIAGNOSIS — J301 Allergic rhinitis due to pollen: Secondary | ICD-10-CM | POA: Diagnosis not present

## 2022-01-11 DIAGNOSIS — J3081 Allergic rhinitis due to animal (cat) (dog) hair and dander: Secondary | ICD-10-CM | POA: Diagnosis not present

## 2022-01-11 DIAGNOSIS — J3089 Other allergic rhinitis: Secondary | ICD-10-CM | POA: Diagnosis not present

## 2022-01-16 DIAGNOSIS — Z20822 Contact with and (suspected) exposure to covid-19: Secondary | ICD-10-CM | POA: Diagnosis not present

## 2022-01-17 ENCOUNTER — Ambulatory Visit
Admission: RE | Admit: 2022-01-17 | Discharge: 2022-01-17 | Disposition: A | Discharge status: Home / Self Care | Payer: Medicare Other | Source: Ambulatory Visit | Attending: Pulmonary Disease | Admitting: Pulmonary Disease

## 2022-01-17 DIAGNOSIS — R911 Solitary pulmonary nodule: Secondary | ICD-10-CM

## 2022-01-17 DIAGNOSIS — I700 Atherosclerosis of aorta: Secondary | ICD-10-CM | POA: Diagnosis not present

## 2022-01-18 DIAGNOSIS — J3089 Other allergic rhinitis: Secondary | ICD-10-CM | POA: Diagnosis not present

## 2022-01-18 DIAGNOSIS — J301 Allergic rhinitis due to pollen: Secondary | ICD-10-CM | POA: Diagnosis not present

## 2022-01-18 DIAGNOSIS — J3081 Allergic rhinitis due to animal (cat) (dog) hair and dander: Secondary | ICD-10-CM | POA: Diagnosis not present

## 2022-01-24 DIAGNOSIS — J301 Allergic rhinitis due to pollen: Secondary | ICD-10-CM | POA: Diagnosis not present

## 2022-01-24 DIAGNOSIS — J3081 Allergic rhinitis due to animal (cat) (dog) hair and dander: Secondary | ICD-10-CM | POA: Diagnosis not present

## 2022-01-24 DIAGNOSIS — J3089 Other allergic rhinitis: Secondary | ICD-10-CM | POA: Diagnosis not present

## 2022-01-29 ENCOUNTER — Encounter: Payer: Self-pay | Admitting: Pulmonary Disease

## 2022-01-29 ENCOUNTER — Ambulatory Visit (INDEPENDENT_AMBULATORY_CARE_PROVIDER_SITE_OTHER): Payer: Medicare Other | Admitting: Pulmonary Disease

## 2022-01-29 VITALS — BP 130/76 | HR 63 | Temp 97.7°F | Ht 70.0 in | Wt 218.0 lb

## 2022-01-29 DIAGNOSIS — R931 Abnormal findings on diagnostic imaging of heart and coronary circulation: Secondary | ICD-10-CM

## 2022-01-29 DIAGNOSIS — J849 Interstitial pulmonary disease, unspecified: Secondary | ICD-10-CM

## 2022-01-29 NOTE — Progress Notes (Signed)
Frederick Bridges    [id number]    19-Aug-1951  Primary Care Physician:Paterson, Ermalene Searing, MD  Referring Physician: Donnajean Lopes, MD [address] [name],  [place] [address]  Chief complaint: Consult for interstitial lung disease  HPI: 71 year old with history of rheumatoid arthritis, sleep apnea [on dental device], atrial fibrillation.  Referred here for evaluation of abnormal CT.  He had a CT angiogram done in December which showed subtle changes at the base concerning for interstitial lung disease.    He had episode of bronchitis a few days before his CT and was treated with prednisone as an outpatient.  Tested negative for COVID and flu.  Chest x-ray was normal, States that his breathing is doing well now with no issues.  Denies any cough, dyspnea.  He does have occasional chest tightness with wheezing  He has longstanding rheumatoid arthritis and was on Enbrel.  This was switched to Simponi a few years ago but has worsening joint symptoms on the latter medication.  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  He is negative for other exposures Smoking history: 38-pack-year smoker.  Quit in 2009 Travel history: No significant travel history Relevant family history: Mom and brother had lung cancer.  Interim history: Here for review of CT.  Doing well with his breathing with no issues  Outpatient Encounter Medications as of 01/29/2022  Medication Sig   Ascorbic Acid (VITAMIN C) 1000 MG tablet Take 1,000 mg by mouth in the morning.   aspirin EC 81 MG tablet Take 81 mg by mouth daily. Swallow whole.   Azelastine HCl 0.15 % SOLN Place 2 sprays into both nostrils at bedtime.   cetirizine (ZYRTEC) 10 MG tablet Take 10 mg by mouth every  evening.   CHELATED MAGNESIUM PO Take 100 mg by mouth in the morning and at bedtime.   Cholecalciferol (VITAMIN D-3) 125 MCG (5000 UT) TABS Take 5,000 Units by mouth every evening.   ENBREL SURECLICK 50 MG/ML injection Inject 50 mg into the skin once a week.   EPINEPHrine 0.3 mg/0.3 mL IJ SOAJ injection Inject 0.3 mg into the muscle as needed for anaphylaxis.   fexofenadine (ALLEGRA) 180 MG tablet Take 180 mg by mouth in the morning.   finasteride (PROSCAR) 5 MG tablet Take 5 mg by mouth daily.   flecainide (TAMBOCOR) 100 MG tablet TAKE 1 TABLET BY MOUTH TWICE A DAY   fluticasone (FLONASE) 50 MCG/ACT nasal spray Place 1 spray into both nostrils every morning.   Green Tea, Camellia sinensis, (GREEN TEA EXTRACT PO) Take 350 mg by mouth every evening.   hydroxychloroquine (PLAQUENIL) 200 MG tablet Take 200 mg by mouth 2 (two) times daily.   Menatetrenone (VITAMIN K2) 100 MCG TABS Take 100 mcg by mouth every evening.   Methylcobalamin 5000 MCG TBDP Take 5,000 mcg by mouth in the morning.   metoprolol succinate (TOPROL-XL) 25 MG 24 hr tablet Take 25 mg by mouth in the morning.   Misc Natural Products (MIXED TOCOTRIENOLS W/ VITA E PO) Take 1 capsule by mouth in the morning.   montelukast (SINGULAIR) 10 MG tablet Take 10 mg by mouth daily.   Multiple Vitamin (MULTIVITAMIN WITH MINERALS) TABS tablet Take 1 tablet by mouth in the morning.  NON FORMULARY Inject 1 Dose as directed once a week. Allergy shots   Quercetin 500 MG CAPS Take 2 capsules by mouth daily.   rosuvastatin (CRESTOR) 20 MG tablet Take 1 tablet (20 mg total) by mouth daily.   tamsulosin (FLOMAX) 0.4 MG CAPS capsule Take 0.4 mg by mouth at bedtime.   TOCOPHEROLS-TOCOTRIENOLS PO Take 1 capsule by mouth every evening.   Ubiquinol 100 MG CAPS Take 100 mg by mouth in the morning.   Zinc 50 MG TABS Take 50 mg by mouth every evening.   No facility-administered encounter medications on file as of 01/29/2022.   Physical Exam: Blood  pressure 130/64, pulse 66, temperature 97.8 F (36.6 C), temperature source Oral, height '5\' 10"'$  (1.778 m), weight 216 lb (98 kg), SpO2 97 %. Gen:      No acute distress HEENT:  EOMI, sclera anicteric Neck:     No masses; no thyromegaly Lungs:    Clear to auscultation bilaterally; normal respiratory effort CV:         Regular rate and rhythm; no murmurs Abd:      + bowel sounds; soft, non-tender; no palpable masses, no distension Ext:    No edema; adequate peripheral perfusion Skin:      Warm and dry; no rash Neuro: alert and oriented x 3 Psych: normal mood and affect   Data Reviewed: Imaging: CT cardiac 08/18/2021-visualized lung bases show mild reticulation, groundglass opacities at the base.  CT high-resolution 10/16/2021-mild groundglass reticulation in the lower lobes, mild trapping, clustered nodularity in the right middle lobe, emphysema  CT chest 01/17/2022-mild reticulation, subpleural densities, stable peribronchovascular nodularity in the right middle lobe. I have reviewed the images personally.  PFTs: 10/20/2021 FVC 3.21 [71%], FEV1 2.64 [80%], F/F 82, TLC 5.76 [82%], DLCO 21.89 [83%] Normal test  Labs: Labs from primary care CBC 01/09/2021-WBC 9.93, eos 4.8%, absolute eosinophil count 477  Assessment:  Evaluation for interstitial lung disease CT scan shows minimal changes at the lung base.  Not totally convincing for interstitial lung disease, PFTs are normal He will need monitoring as he has rheumatoid arthritis  Repeat high-res CT in 1 year  Plan/Recommendations: CT scan in 1 year and follow-up in clinic  Frederick Garfinkel MD [facility] 01/29/2022, 10:24 AM  CC: Frederick Lopes, MD

## 2022-01-29 NOTE — Addendum Note (Signed)
Addended by: Gavin Potters R on: 01/29/2022 11:57 AM   Modules accepted: Orders

## 2022-01-29 NOTE — Patient Instructions (Signed)
I am glad you are doing well with your breathing CT scan looks stable which is good news. We will order high-res CT in 1 year and follow-up in clinic after CT scan in 1 year.

## 2022-02-02 DIAGNOSIS — J301 Allergic rhinitis due to pollen: Secondary | ICD-10-CM | POA: Diagnosis not present

## 2022-02-02 DIAGNOSIS — J3081 Allergic rhinitis due to animal (cat) (dog) hair and dander: Secondary | ICD-10-CM | POA: Diagnosis not present

## 2022-02-02 DIAGNOSIS — J3089 Other allergic rhinitis: Secondary | ICD-10-CM | POA: Diagnosis not present

## 2022-02-07 DIAGNOSIS — Z79899 Other long term (current) drug therapy: Secondary | ICD-10-CM | POA: Diagnosis not present

## 2022-02-07 DIAGNOSIS — H1045 Other chronic allergic conjunctivitis: Secondary | ICD-10-CM | POA: Diagnosis not present

## 2022-02-07 DIAGNOSIS — H04123 Dry eye syndrome of bilateral lacrimal glands: Secondary | ICD-10-CM | POA: Diagnosis not present

## 2022-02-07 DIAGNOSIS — H2513 Age-related nuclear cataract, bilateral: Secondary | ICD-10-CM | POA: Diagnosis not present

## 2022-02-07 DIAGNOSIS — H0520 Unspecified exophthalmos: Secondary | ICD-10-CM | POA: Diagnosis not present

## 2022-02-07 DIAGNOSIS — H02831 Dermatochalasis of right upper eyelid: Secondary | ICD-10-CM | POA: Diagnosis not present

## 2022-02-07 DIAGNOSIS — M069 Rheumatoid arthritis, unspecified: Secondary | ICD-10-CM | POA: Diagnosis not present

## 2022-02-07 DIAGNOSIS — H02834 Dermatochalasis of left upper eyelid: Secondary | ICD-10-CM | POA: Diagnosis not present

## 2022-02-09 ENCOUNTER — Other Ambulatory Visit: Payer: Medicare Other | Admitting: *Deleted

## 2022-02-09 DIAGNOSIS — J3081 Allergic rhinitis due to animal (cat) (dog) hair and dander: Secondary | ICD-10-CM | POA: Diagnosis not present

## 2022-02-09 DIAGNOSIS — J3089 Other allergic rhinitis: Secondary | ICD-10-CM | POA: Diagnosis not present

## 2022-02-09 DIAGNOSIS — R931 Abnormal findings on diagnostic imaging of heart and coronary circulation: Secondary | ICD-10-CM

## 2022-02-09 DIAGNOSIS — J301 Allergic rhinitis due to pollen: Secondary | ICD-10-CM | POA: Diagnosis not present

## 2022-02-09 DIAGNOSIS — E782 Mixed hyperlipidemia: Secondary | ICD-10-CM

## 2022-02-09 LAB — LIPID PANEL
Chol/HDL Ratio: 4.1 ratio (ref 0.0–5.0)
Cholesterol, Total: 163 mg/dL (ref 100–199)
HDL: 40 mg/dL (ref >39)
LDL Chol Calc (NIH): 98 mg/dL (ref 0–99)
Triglycerides: 140 mg/dL (ref 0–149)
VLDL Cholesterol Cal: 25 mg/dL (ref 5–40)

## 2022-02-12 ENCOUNTER — Telehealth: Payer: Self-pay | Admitting: Pharmacist

## 2022-02-12 DIAGNOSIS — E782 Mixed hyperlipidemia: Secondary | ICD-10-CM

## 2022-02-12 DIAGNOSIS — R931 Abnormal findings on diagnostic imaging of heart and coronary circulation: Secondary | ICD-10-CM

## 2022-02-12 MED ORDER — EZETIMIBE 10 MG PO TABS: 10.0000 mg | ORAL_TABLET | Freq: Every day | ORAL | 3 refills | Status: DC

## 2022-02-12 NOTE — Telephone Encounter (Signed)
LDL relatively unchanged after increasing rosuvastatin '10mg'$  to '20mg'$  daily. LDL goal is < 70 due to calcium score > 2,000. He did not wish to add on new medication when I spoke with him last. Increasing statin further will not bring his LDL to goal, requires addition of Zetia. Discussed again, pt again hesitant to change medication but is eventually agreeable to adding on Zetia. Will recheck lipids and LFTs in 2 months.

## 2022-02-15 DIAGNOSIS — J301 Allergic rhinitis due to pollen: Secondary | ICD-10-CM | POA: Diagnosis not present

## 2022-02-15 DIAGNOSIS — J3089 Other allergic rhinitis: Secondary | ICD-10-CM | POA: Diagnosis not present

## 2022-02-15 DIAGNOSIS — J3081 Allergic rhinitis due to animal (cat) (dog) hair and dander: Secondary | ICD-10-CM | POA: Diagnosis not present

## 2022-02-16 DIAGNOSIS — M170 Bilateral primary osteoarthritis of knee: Secondary | ICD-10-CM | POA: Diagnosis not present

## 2022-02-22 DIAGNOSIS — M170 Bilateral primary osteoarthritis of knee: Secondary | ICD-10-CM | POA: Diagnosis not present

## 2022-02-22 DIAGNOSIS — M1711 Unilateral primary osteoarthritis, right knee: Secondary | ICD-10-CM | POA: Diagnosis not present

## 2022-02-22 DIAGNOSIS — M1712 Unilateral primary osteoarthritis, left knee: Secondary | ICD-10-CM | POA: Diagnosis not present

## 2022-02-23 DIAGNOSIS — J3089 Other allergic rhinitis: Secondary | ICD-10-CM | POA: Diagnosis not present

## 2022-02-23 DIAGNOSIS — J301 Allergic rhinitis due to pollen: Secondary | ICD-10-CM | POA: Diagnosis not present

## 2022-02-23 DIAGNOSIS — J3081 Allergic rhinitis due to animal (cat) (dog) hair and dander: Secondary | ICD-10-CM | POA: Diagnosis not present

## 2022-02-27 DIAGNOSIS — J3089 Other allergic rhinitis: Secondary | ICD-10-CM | POA: Diagnosis not present

## 2022-02-27 DIAGNOSIS — J301 Allergic rhinitis due to pollen: Secondary | ICD-10-CM | POA: Diagnosis not present

## 2022-02-27 DIAGNOSIS — J3081 Allergic rhinitis due to animal (cat) (dog) hair and dander: Secondary | ICD-10-CM | POA: Diagnosis not present

## 2022-03-01 DIAGNOSIS — M1712 Unilateral primary osteoarthritis, left knee: Secondary | ICD-10-CM | POA: Diagnosis not present

## 2022-03-01 DIAGNOSIS — M1711 Unilateral primary osteoarthritis, right knee: Secondary | ICD-10-CM | POA: Diagnosis not present

## 2022-03-02 DIAGNOSIS — M1712 Unilateral primary osteoarthritis, left knee: Secondary | ICD-10-CM | POA: Diagnosis not present

## 2022-03-08 DIAGNOSIS — J3081 Allergic rhinitis due to animal (cat) (dog) hair and dander: Secondary | ICD-10-CM | POA: Diagnosis not present

## 2022-03-08 DIAGNOSIS — J301 Allergic rhinitis due to pollen: Secondary | ICD-10-CM | POA: Diagnosis not present

## 2022-03-08 DIAGNOSIS — J3089 Other allergic rhinitis: Secondary | ICD-10-CM | POA: Diagnosis not present

## 2022-03-14 DIAGNOSIS — J3081 Allergic rhinitis due to animal (cat) (dog) hair and dander: Secondary | ICD-10-CM | POA: Diagnosis not present

## 2022-03-14 DIAGNOSIS — J3089 Other allergic rhinitis: Secondary | ICD-10-CM | POA: Diagnosis not present

## 2022-03-14 DIAGNOSIS — J301 Allergic rhinitis due to pollen: Secondary | ICD-10-CM | POA: Diagnosis not present

## 2022-03-15 DIAGNOSIS — M8580 Other specified disorders of bone density and structure, unspecified site: Secondary | ICD-10-CM | POA: Diagnosis not present

## 2022-03-15 DIAGNOSIS — I10 Essential (primary) hypertension: Secondary | ICD-10-CM | POA: Diagnosis not present

## 2022-03-15 DIAGNOSIS — E785 Hyperlipidemia, unspecified: Secondary | ICD-10-CM | POA: Diagnosis not present

## 2022-03-15 DIAGNOSIS — R739 Hyperglycemia, unspecified: Secondary | ICD-10-CM | POA: Diagnosis not present

## 2022-03-15 DIAGNOSIS — Z125 Encounter for screening for malignant neoplasm of prostate: Secondary | ICD-10-CM | POA: Diagnosis not present

## 2022-03-22 DIAGNOSIS — G4733 Obstructive sleep apnea (adult) (pediatric): Secondary | ICD-10-CM | POA: Diagnosis not present

## 2022-03-22 DIAGNOSIS — I2510 Atherosclerotic heart disease of native coronary artery without angina pectoris: Secondary | ICD-10-CM | POA: Diagnosis not present

## 2022-03-22 DIAGNOSIS — J3089 Other allergic rhinitis: Secondary | ICD-10-CM | POA: Diagnosis not present

## 2022-03-22 DIAGNOSIS — Z0000 Encounter for general adult medical examination without abnormal findings: Secondary | ICD-10-CM | POA: Diagnosis not present

## 2022-03-22 DIAGNOSIS — Z1331 Encounter for screening for depression: Secondary | ICD-10-CM | POA: Diagnosis not present

## 2022-03-22 DIAGNOSIS — M5416 Radiculopathy, lumbar region: Secondary | ICD-10-CM | POA: Diagnosis not present

## 2022-03-22 DIAGNOSIS — I4891 Unspecified atrial fibrillation: Secondary | ICD-10-CM | POA: Diagnosis not present

## 2022-03-22 DIAGNOSIS — Z1389 Encounter for screening for other disorder: Secondary | ICD-10-CM | POA: Diagnosis not present

## 2022-03-22 DIAGNOSIS — M069 Rheumatoid arthritis, unspecified: Secondary | ICD-10-CM | POA: Diagnosis not present

## 2022-03-22 DIAGNOSIS — I10 Essential (primary) hypertension: Secondary | ICD-10-CM | POA: Diagnosis not present

## 2022-03-22 DIAGNOSIS — R739 Hyperglycemia, unspecified: Secondary | ICD-10-CM | POA: Diagnosis not present

## 2022-03-22 DIAGNOSIS — J301 Allergic rhinitis due to pollen: Secondary | ICD-10-CM | POA: Diagnosis not present

## 2022-03-22 DIAGNOSIS — E785 Hyperlipidemia, unspecified: Secondary | ICD-10-CM | POA: Diagnosis not present

## 2022-03-22 DIAGNOSIS — L989 Disorder of the skin and subcutaneous tissue, unspecified: Secondary | ICD-10-CM | POA: Diagnosis not present

## 2022-03-22 DIAGNOSIS — J3081 Allergic rhinitis due to animal (cat) (dog) hair and dander: Secondary | ICD-10-CM | POA: Diagnosis not present

## 2022-03-22 DIAGNOSIS — R82998 Other abnormal findings in urine: Secondary | ICD-10-CM | POA: Diagnosis not present

## 2022-03-28 DIAGNOSIS — J3089 Other allergic rhinitis: Secondary | ICD-10-CM | POA: Diagnosis not present

## 2022-03-28 DIAGNOSIS — J3081 Allergic rhinitis due to animal (cat) (dog) hair and dander: Secondary | ICD-10-CM | POA: Diagnosis not present

## 2022-03-28 DIAGNOSIS — J301 Allergic rhinitis due to pollen: Secondary | ICD-10-CM | POA: Diagnosis not present

## 2022-04-05 DIAGNOSIS — J301 Allergic rhinitis due to pollen: Secondary | ICD-10-CM | POA: Diagnosis not present

## 2022-04-05 DIAGNOSIS — J3081 Allergic rhinitis due to animal (cat) (dog) hair and dander: Secondary | ICD-10-CM | POA: Diagnosis not present

## 2022-04-05 DIAGNOSIS — J3089 Other allergic rhinitis: Secondary | ICD-10-CM | POA: Diagnosis not present

## 2022-04-13 DIAGNOSIS — M170 Bilateral primary osteoarthritis of knee: Secondary | ICD-10-CM | POA: Diagnosis not present

## 2022-04-13 DIAGNOSIS — J3089 Other allergic rhinitis: Secondary | ICD-10-CM | POA: Diagnosis not present

## 2022-04-13 DIAGNOSIS — J301 Allergic rhinitis due to pollen: Secondary | ICD-10-CM | POA: Diagnosis not present

## 2022-04-13 DIAGNOSIS — J3081 Allergic rhinitis due to animal (cat) (dog) hair and dander: Secondary | ICD-10-CM | POA: Diagnosis not present

## 2022-04-18 DIAGNOSIS — J3081 Allergic rhinitis due to animal (cat) (dog) hair and dander: Secondary | ICD-10-CM | POA: Diagnosis not present

## 2022-04-18 DIAGNOSIS — J3089 Other allergic rhinitis: Secondary | ICD-10-CM | POA: Diagnosis not present

## 2022-04-18 DIAGNOSIS — J301 Allergic rhinitis due to pollen: Secondary | ICD-10-CM | POA: Diagnosis not present

## 2022-04-25 ENCOUNTER — Other Ambulatory Visit: Payer: Medicare Other

## 2022-04-25 DIAGNOSIS — C4441 Basal cell carcinoma of skin of scalp and neck: Secondary | ICD-10-CM | POA: Diagnosis not present

## 2022-04-25 DIAGNOSIS — D225 Melanocytic nevi of trunk: Secondary | ICD-10-CM | POA: Diagnosis not present

## 2022-04-25 DIAGNOSIS — L821 Other seborrheic keratosis: Secondary | ICD-10-CM | POA: Diagnosis not present

## 2022-04-25 DIAGNOSIS — L814 Other melanin hyperpigmentation: Secondary | ICD-10-CM | POA: Diagnosis not present

## 2022-04-25 DIAGNOSIS — Z85828 Personal history of other malignant neoplasm of skin: Secondary | ICD-10-CM | POA: Diagnosis not present

## 2022-04-25 DIAGNOSIS — E782 Mixed hyperlipidemia: Secondary | ICD-10-CM | POA: Diagnosis not present

## 2022-04-25 DIAGNOSIS — D492 Neoplasm of unspecified behavior of bone, soft tissue, and skin: Secondary | ICD-10-CM | POA: Diagnosis not present

## 2022-04-25 DIAGNOSIS — R931 Abnormal findings on diagnostic imaging of heart and coronary circulation: Secondary | ICD-10-CM | POA: Diagnosis not present

## 2022-04-25 DIAGNOSIS — Z08 Encounter for follow-up examination after completed treatment for malignant neoplasm: Secondary | ICD-10-CM | POA: Diagnosis not present

## 2022-04-25 LAB — HEPATIC FUNCTION PANEL
ALT: 39 IU/L (ref 0–44)
AST: 40 IU/L (ref 0–40)
Albumin: 4.4 g/dL (ref 3.9–4.9)
Alkaline Phosphatase: 82 IU/L (ref 44–121)
Bilirubin Total: 0.3 mg/dL (ref 0.0–1.2)
Bilirubin, Direct: 0.12 mg/dL (ref 0.00–0.40)
Total Protein: 7 g/dL (ref 6.0–8.5)

## 2022-04-25 LAB — LIPID PANEL
Chol/HDL Ratio: 3.1 ratio (ref 0.0–5.0)
Cholesterol, Total: 126 mg/dL (ref 100–199)
HDL: 41 mg/dL (ref >39)
LDL Chol Calc (NIH): 68 mg/dL (ref 0–99)
Triglycerides: 91 mg/dL (ref 0–149)
VLDL Cholesterol Cal: 17 mg/dL (ref 5–40)

## 2022-04-26 DIAGNOSIS — J301 Allergic rhinitis due to pollen: Secondary | ICD-10-CM | POA: Diagnosis not present

## 2022-04-26 DIAGNOSIS — J3081 Allergic rhinitis due to animal (cat) (dog) hair and dander: Secondary | ICD-10-CM | POA: Diagnosis not present

## 2022-04-26 DIAGNOSIS — J3089 Other allergic rhinitis: Secondary | ICD-10-CM | POA: Diagnosis not present

## 2022-05-02 DIAGNOSIS — J301 Allergic rhinitis due to pollen: Secondary | ICD-10-CM | POA: Diagnosis not present

## 2022-05-02 DIAGNOSIS — J3081 Allergic rhinitis due to animal (cat) (dog) hair and dander: Secondary | ICD-10-CM | POA: Diagnosis not present

## 2022-05-02 DIAGNOSIS — J3089 Other allergic rhinitis: Secondary | ICD-10-CM | POA: Diagnosis not present

## 2022-05-09 ENCOUNTER — Ambulatory Visit (HOSPITAL_COMMUNITY)
Admission: RE | Admit: 2022-05-09 | Discharge: 2022-05-09 | Disposition: A | Discharge status: Home / Self Care | Payer: Medicare Other | Source: Ambulatory Visit | Attending: Physician Assistant | Admitting: Physician Assistant

## 2022-05-09 ENCOUNTER — Encounter (HOSPITAL_COMMUNITY): Payer: Self-pay | Admitting: Physician Assistant

## 2022-05-09 VITALS — BP 106/76 | HR 60 | Ht 70.0 in | Wt 215.6 lb

## 2022-05-09 DIAGNOSIS — Z6830 Body mass index (BMI) 30.0-30.9, adult: Secondary | ICD-10-CM | POA: Diagnosis not present

## 2022-05-09 DIAGNOSIS — J3081 Allergic rhinitis due to animal (cat) (dog) hair and dander: Secondary | ICD-10-CM | POA: Diagnosis not present

## 2022-05-09 DIAGNOSIS — I2510 Atherosclerotic heart disease of native coronary artery without angina pectoris: Secondary | ICD-10-CM | POA: Insufficient documentation

## 2022-05-09 DIAGNOSIS — I4819 Other persistent atrial fibrillation: Secondary | ICD-10-CM | POA: Insufficient documentation

## 2022-05-09 DIAGNOSIS — G4733 Obstructive sleep apnea (adult) (pediatric): Secondary | ICD-10-CM | POA: Diagnosis not present

## 2022-05-09 DIAGNOSIS — M069 Rheumatoid arthritis, unspecified: Secondary | ICD-10-CM | POA: Insufficient documentation

## 2022-05-09 DIAGNOSIS — Z8679 Personal history of other diseases of the circulatory system: Secondary | ICD-10-CM | POA: Diagnosis not present

## 2022-05-09 DIAGNOSIS — D6869 Other thrombophilia: Secondary | ICD-10-CM | POA: Diagnosis not present

## 2022-05-09 DIAGNOSIS — Z7901 Long term (current) use of anticoagulants: Secondary | ICD-10-CM | POA: Diagnosis not present

## 2022-05-09 DIAGNOSIS — J301 Allergic rhinitis due to pollen: Secondary | ICD-10-CM | POA: Diagnosis not present

## 2022-05-09 DIAGNOSIS — J3089 Other allergic rhinitis: Secondary | ICD-10-CM | POA: Diagnosis not present

## 2022-05-09 DIAGNOSIS — E669 Obesity, unspecified: Secondary | ICD-10-CM | POA: Diagnosis not present

## 2022-05-09 LAB — CBC
HCT: 43.2 % (ref 39.0–52.0)
Hemoglobin: 14.1 g/dL (ref 13.0–17.0)
MCH: 30.5 pg (ref 26.0–34.0)
MCHC: 32.6 g/dL (ref 30.0–36.0)
MCV: 93.5 fL (ref 80.0–100.0)
Platelets: 248 10*3/uL (ref 150–400)
RBC: 4.62 MIL/uL (ref 4.22–5.81)
RDW: 12.9 % (ref 11.5–15.5)
WBC: 8.9 10*3/uL (ref 4.0–10.5)
nRBC: 0 % (ref 0.0–0.2)

## 2022-05-09 MED ORDER — APIXABAN 5 MG PO TABS: 5.0000 mg | ORAL_TABLET | Freq: Two times a day (BID) | ORAL | 3 refills | Status: DC

## 2022-05-09 NOTE — Patient Instructions (Signed)
Stop flecainide  Stop aspirin  Start Eliquis '5mg'$  twice a day

## 2022-05-09 NOTE — Progress Notes (Signed)
Primary Care Physician: Donnajean Lopes, MD Primary Cardiologist: Dr [name]  Primary Electrophysiologist: none Referring Physician: Dr Gwynneth Macleod is a 71 y.o. male with a history of AAA, RA, OSA, CAD, and atrial fibrillation who presents for follow up in the [facility].  The patient was initially diagnosed with atrial fibrillation 01/2021 after presenting to his PCP for routine follow up. He is unaware of his arrhythmia and rate controlled. Patient is on Eliquis for a CHADS2VASC score of 1. He underwent DCCV on 02/20/21 which was unsuccessful. He does consume alcohol. He has an oral device for his OSA. Patient is s/p repeat DCCV on 03/10/21.   On follow up today, patient reports that he has done well since his last visit with no tachypalpitations. He is in SR today. He did have a cardiac CT which showed CAC score of 2179.   Today, he denies symptoms of palpitations, chest pain, shortness of breath, orthopnea, PND, lower extremity edema, dizziness, presyncope, syncope, bleeding, or neurologic sequela. The patient is tolerating medications without difficulties and is otherwise without complaint today.    Atrial Fibrillation Risk Factors:  he does have symptoms or diagnosis of sleep apnea. he is compliant with oral device for OSA. he does not have a history of rheumatic fever. he does have a history of alcohol use. The patient does not have a history of early familial atrial fibrillation or other arrhythmias.  he has a BMI of Body mass index is 30.94 kg/m.Marland Kitchen Filed Weights   05/09/22 0827  Weight: 97.8 kg    Family History  Problem Relation Age of Onset   Lung cancer Mother    Lung cancer Brother    Colon cancer Neg Hx    Pancreatic cancer Neg Hx    Rectal cancer Neg Hx    Stomach cancer Neg Hx    Colon polyps Neg Hx    Esophageal cancer Neg Hx      Atrial Fibrillation Management history:  Previous antiarrhythmic drugs:  flecainide Previous cardioversions: 02/20/21, 03/10/21 Previous ablations: none CHADS2VASC score: 2 Anticoagulation history: Eliquis   Past Medical History:  Diagnosis Date   A-fib [facility])    AAA (abdominal aortic aneurysm) (HCC)    Allergy    hayfever   Colon polyps    Hay fever    Nocturia    Personal history of colonic adenomas 12/22/2012   Rheumatoid arthritis ([place])    Sleep apnea    Dental Device   Wears glasses    Past Surgical History:  Procedure Laterality Date   CARDIOVERSION N/A 02/20/2021   Procedure: CARDIOVERSION;  Surgeon: Sueanne Margarita, MD;  Location: [facility];  Service: Cardiovascular;  Laterality: N/A;   CARDIOVERSION N/A 03/10/2021   Procedure: CARDIOVERSION;  Surgeon: Geralynn Rile, MD;  Location: [place] [facility];  Service: Cardiovascular;  Laterality: N/A;   COLONOSCOPY  multiple   INGUINAL HERNIA REPAIR     PARATHYROIDECTOMY N/A 09/02/2015   Procedure: PARATHYROIDECTOMY;  Surgeon: Armandina Gemma, MD;  Location: [place];  Service: General;  Laterality: N/A;   TONSILLECTOMY     as a child    Current Outpatient Medications  Medication Sig Dispense Refill   Ascorbic Acid (VITAMIN C) 1000 MG tablet Take 1,000 mg by mouth in the morning.     aspirin EC 81 MG tablet Take 81 mg by mouth daily. Swallow whole.     Azelastine HCl 0.15 % SOLN Place 2 sprays into both  nostrils at bedtime.     cetirizine (ZYRTEC) 10 MG tablet Take 10 mg by mouth every evening.     CHELATED MAGNESIUM PO Take 100 mg by mouth in the morning and at bedtime.     Cholecalciferol (VITAMIN D-3) 125 MCG (5000 UT) TABS Take 5,000 Units by mouth every evening.     ENBREL SURECLICK 50 MG/ML injection Inject 50 mg into the skin once a week.     EPINEPHrine 0.3 mg/0.3 mL IJ SOAJ injection Inject 0.3 mg into the muscle as needed for anaphylaxis.     ezetimibe (ZETIA) 10 MG tablet Take 1 tablet (10 mg total) by mouth daily. 90 tablet 3   fexofenadine (ALLEGRA) 180 MG tablet Take 180 mg by mouth in  the morning.     finasteride (PROSCAR) 5 MG tablet Take 5 mg by mouth daily.     flecainide (TAMBOCOR) 100 MG tablet TAKE 1 TABLET BY MOUTH TWICE A DAY 180 tablet 1   fluticasone (FLONASE) 50 MCG/ACT nasal spray Place 1 spray into both nostrils every morning.     Green Tea, Camellia sinensis, (GREEN TEA EXTRACT PO) Take 350 mg by mouth every evening.     hydroxychloroquine (PLAQUENIL) 200 MG tablet Take 200 mg by mouth 2 (two) times daily.     Menatetrenone (VITAMIN K2) 100 MCG TABS Take 100 mcg by mouth every evening.     Methylcobalamin 5000 MCG TBDP Take 5,000 mcg by mouth in the morning.     metoprolol succinate (TOPROL-XL) 25 MG 24 hr tablet Take 25 mg by mouth in the morning.     Misc Natural Products (MIXED TOCOTRIENOLS W/ VITA E PO) Take 1 capsule by mouth in the morning.     montelukast (SINGULAIR) 10 MG tablet Take 10 mg by mouth daily.     Multiple Vitamin (MULTIVITAMIN WITH MINERALS) TABS tablet Take 1 tablet by mouth in the morning.     NON FORMULARY Inject 1 Dose as directed once a week. Allergy shots     Quercetin 500 MG CAPS Take 2 capsules by mouth daily.     rosuvastatin (CRESTOR) 20 MG tablet Take 1 tablet (20 mg total) by mouth daily. 90 tablet 3   tamsulosin (FLOMAX) 0.4 MG CAPS capsule Take 0.4 mg by mouth at bedtime.     TOCOPHEROLS-TOCOTRIENOLS PO Take 1 capsule by mouth every evening.     Ubiquinol 100 MG CAPS Take 100 mg by mouth in the morning.     Zinc 50 MG TABS Take 50 mg by mouth every evening.     No current facility-administered medications for this encounter.    Allergies  Allergen Reactions   Prednisone     Other reaction(s): makes hyper/anxious   Ciprofloxacin Rash and Other (See Comments)    pain   Sulfa Antibiotics Rash    pain    Social History   Socioeconomic History   Marital status: Married    Spouse name: Not on file   Number of children: Not on file   Years of education: Not on file   Highest education level: Not on file   Occupational History   Occupation: retired    Fish farm manager: [employer]  Tobacco Use   Smoking status: Former    Types: Cigarettes    Quit date: 07/10/2008    Years since quitting: 13.8   Smokeless tobacco: Never   Tobacco comments:    Former smoker 05/03/2021  Vaping Use   Vaping Use: Never used  Substance and Sexual Activity   Alcohol use: Yes    Alcohol/week: 2.0 - 3.0 standard drinks of alcohol    Types: 2 - 3 Standard drinks or equivalent per week    Comment: 2-3 beers/wine weekly 05/09/22   Drug use: No   Sexual activity: Not on file  Other Topics Concern   Not on file  Social History Narrative   Left handed    Social Determinants of Health   Financial Resource Strain: Not on file  Food Insecurity: Not on file  Transportation Needs: Not on file  Physical Activity: Not on file  Stress: Not on file  Social Connections: Not on file  Intimate Partner Violence: Not on file     ROS- All systems are reviewed and negative except as per the HPI above.  Physical Exam: Vitals:   05/09/22 0827  BP: 106/76  Pulse: 60  Weight: 97.8 kg  Height: '5\' 10"'$  (1.778 m)     GEN- The patient is a well appearing male, alert and oriented x 3 today.   HEENT-head normocephalic, atraumatic, sclera clear, conjunctiva pink, hearing intact, trachea midline. Lungs- Clear to ausculation bilaterally, normal work of breathing Heart- Regular rate and rhythm, no murmurs, rubs or gallops  GI- soft, NT, ND, + BS Extremities- no clubbing, cyanosis, or edema MS- no significant deformity or atrophy Skin- no rash or lesion Psych- euthymic mood, full affect Neuro- strength and sensation are intact   Wt Readings from Last 3 Encounters:  05/09/22 97.8 kg  01/29/22 98.9 kg  11/06/21 99.1 kg    EKG today demonstrates  SR Vent. rate 60 BPM PR interval 148 ms QRS duration 100 ms QT/QTcB 414/414 ms  Echo 02/13/21 demonstrated  1. The mitral valve is grossly normal. Moderate  mitral valve  regurgitation- mechanism appears to be atrial functional; there is  systolic blunting of the pulmonary vein flow. No evidence of mitral  stenosis.   2. Left atrial size was severely dilated.   3. Left ventricular ejection fraction, by estimation, is 50 to 55%. The  left ventricle has low normal function. The left ventricle has no regional wall motion abnormalities. Left ventricular diastolic parameters are indeterminate.   4. Right ventricular systolic function is normal. The right ventricular  size is normal. Tricuspid regurgitation signal is inadequate for assessing PA pressure.   5. Right atrial size was mildly dilated.   6. The aortic valve is tricuspid. There is mild calcification of the  aortic valve. There is mild thickening of the aortic valve. Aortic valve  regurgitation is not visualized. No aortic stenosis is present.   7. Aortic dilatation noted. There is mild dilatation of the aortic root,  measuring 41 mm.   8. The inferior vena cava is normal in size with greater than 50%  respiratory variability, suggesting right atrial pressure of 3 mmHg.   Epic records are reviewed at length today  CHA2DS2-VASc Score = 2  The patient's score is based upon: CHF History: 0 HTN History: 0 Diabetes History: 0 Stroke History: 0 Vascular Disease History: 1 Age Score: 1 Gender Score: 0       ASSESSMENT AND PLAN: 1. Persistent Atrial Fibrillation (ICD10:  I48.19) The patient's CHA2DS2-VASc score is 2, indicating a 2.2% annual risk of stroke.   Patient maintaining SR on flecainide, unfortunately he has a significantly elevated CAC score. We discussed the CAST trial and mutually determined that the risks of flecainide outweigh the benefits. Will stop flecainide today. Given his severely dilated  LA, he will likely need another form of rhythm control. We discussed alternate AAD although his options are limited because he is on hydroxychloroquine for his RA. Amiodarone is also  not idea with questionable ILD. Hopefully, he has had positive remodeling of his LA in SR and would be a better ablation candidate. He would like to take time to consider his options before making a decision.  Continue Toprol 25 mg daily Will start Eliquis 5 mg BID with CV score of 2 now. Check cbc.   2. Secondary Hypercoagulable State (ICD10:  D68.69) The patient is at significant risk for stroke/thromboembolism based upon his CHA2DS2-VASc Score of 2.  Start Apixaban (Eliquis).   3. Obesity Body mass index is 30.94 kg/m. Lifestyle modification was discussed and encouraged including regular physical activity and weight reduction.  4. Obstructive sleep apnea Encouraged compliance with oral device.  5. CAD CAC score of 2179 On statin and Zetia No anginal symptoms.   Follow up with Dr [name] per recall. AF clinic in 3 months.    [facility] [address] [place], [place] [address] [phone] 05/09/2022 8:38 AM

## 2022-05-10 DIAGNOSIS — J3089 Other allergic rhinitis: Secondary | ICD-10-CM | POA: Diagnosis not present

## 2022-05-10 DIAGNOSIS — J3081 Allergic rhinitis due to animal (cat) (dog) hair and dander: Secondary | ICD-10-CM | POA: Diagnosis not present

## 2022-05-10 DIAGNOSIS — J301 Allergic rhinitis due to pollen: Secondary | ICD-10-CM | POA: Diagnosis not present

## 2022-05-17 DIAGNOSIS — J3081 Allergic rhinitis due to animal (cat) (dog) hair and dander: Secondary | ICD-10-CM | POA: Diagnosis not present

## 2022-05-17 DIAGNOSIS — J301 Allergic rhinitis due to pollen: Secondary | ICD-10-CM | POA: Diagnosis not present

## 2022-05-17 DIAGNOSIS — J3089 Other allergic rhinitis: Secondary | ICD-10-CM | POA: Diagnosis not present

## 2022-05-31 DIAGNOSIS — J301 Allergic rhinitis due to pollen: Secondary | ICD-10-CM | POA: Diagnosis not present

## 2022-05-31 DIAGNOSIS — J3089 Other allergic rhinitis: Secondary | ICD-10-CM | POA: Diagnosis not present

## 2022-05-31 DIAGNOSIS — J3081 Allergic rhinitis due to animal (cat) (dog) hair and dander: Secondary | ICD-10-CM | POA: Diagnosis not present

## 2022-06-06 DIAGNOSIS — J3089 Other allergic rhinitis: Secondary | ICD-10-CM | POA: Diagnosis not present

## 2022-06-06 DIAGNOSIS — J3081 Allergic rhinitis due to animal (cat) (dog) hair and dander: Secondary | ICD-10-CM | POA: Diagnosis not present

## 2022-06-06 DIAGNOSIS — J301 Allergic rhinitis due to pollen: Secondary | ICD-10-CM | POA: Diagnosis not present

## 2022-06-14 DIAGNOSIS — J3089 Other allergic rhinitis: Secondary | ICD-10-CM | POA: Diagnosis not present

## 2022-06-14 DIAGNOSIS — C4441 Basal cell carcinoma of skin of scalp and neck: Secondary | ICD-10-CM | POA: Diagnosis not present

## 2022-06-14 DIAGNOSIS — J301 Allergic rhinitis due to pollen: Secondary | ICD-10-CM | POA: Diagnosis not present

## 2022-06-14 DIAGNOSIS — J3081 Allergic rhinitis due to animal (cat) (dog) hair and dander: Secondary | ICD-10-CM | POA: Diagnosis not present

## 2022-06-18 DIAGNOSIS — Z6831 Body mass index (BMI) 31.0-31.9, adult: Secondary | ICD-10-CM | POA: Diagnosis not present

## 2022-06-18 DIAGNOSIS — M5432 Sciatica, left side: Secondary | ICD-10-CM | POA: Diagnosis not present

## 2022-06-18 DIAGNOSIS — R5383 Other fatigue: Secondary | ICD-10-CM | POA: Diagnosis not present

## 2022-06-18 DIAGNOSIS — M0579 Rheumatoid arthritis with rheumatoid factor of multiple sites without organ or systems involvement: Secondary | ICD-10-CM | POA: Diagnosis not present

## 2022-06-18 DIAGNOSIS — E669 Obesity, unspecified: Secondary | ICD-10-CM | POA: Diagnosis not present

## 2022-06-18 DIAGNOSIS — Z79899 Other long term (current) drug therapy: Secondary | ICD-10-CM | POA: Diagnosis not present

## 2022-06-18 DIAGNOSIS — L409 Psoriasis, unspecified: Secondary | ICD-10-CM | POA: Diagnosis not present

## 2022-06-22 DIAGNOSIS — J3081 Allergic rhinitis due to animal (cat) (dog) hair and dander: Secondary | ICD-10-CM | POA: Diagnosis not present

## 2022-06-22 DIAGNOSIS — J3089 Other allergic rhinitis: Secondary | ICD-10-CM | POA: Diagnosis not present

## 2022-06-22 DIAGNOSIS — J301 Allergic rhinitis due to pollen: Secondary | ICD-10-CM | POA: Diagnosis not present

## 2022-06-28 DIAGNOSIS — J301 Allergic rhinitis due to pollen: Secondary | ICD-10-CM | POA: Diagnosis not present

## 2022-06-28 DIAGNOSIS — J3081 Allergic rhinitis due to animal (cat) (dog) hair and dander: Secondary | ICD-10-CM | POA: Diagnosis not present

## 2022-06-28 DIAGNOSIS — J3089 Other allergic rhinitis: Secondary | ICD-10-CM | POA: Diagnosis not present

## 2022-07-06 DIAGNOSIS — J3089 Other allergic rhinitis: Secondary | ICD-10-CM | POA: Diagnosis not present

## 2022-07-06 DIAGNOSIS — J3081 Allergic rhinitis due to animal (cat) (dog) hair and dander: Secondary | ICD-10-CM | POA: Diagnosis not present

## 2022-07-06 DIAGNOSIS — J301 Allergic rhinitis due to pollen: Secondary | ICD-10-CM | POA: Diagnosis not present

## 2022-07-13 DIAGNOSIS — J3081 Allergic rhinitis due to animal (cat) (dog) hair and dander: Secondary | ICD-10-CM | POA: Diagnosis not present

## 2022-07-13 DIAGNOSIS — J301 Allergic rhinitis due to pollen: Secondary | ICD-10-CM | POA: Diagnosis not present

## 2022-07-13 DIAGNOSIS — J3089 Other allergic rhinitis: Secondary | ICD-10-CM | POA: Diagnosis not present

## 2022-07-16 DIAGNOSIS — M0579 Rheumatoid arthritis with rheumatoid factor of multiple sites without organ or systems involvement: Secondary | ICD-10-CM | POA: Diagnosis not present

## 2022-07-20 DIAGNOSIS — J3081 Allergic rhinitis due to animal (cat) (dog) hair and dander: Secondary | ICD-10-CM | POA: Diagnosis not present

## 2022-07-20 DIAGNOSIS — J301 Allergic rhinitis due to pollen: Secondary | ICD-10-CM | POA: Diagnosis not present

## 2022-07-20 DIAGNOSIS — J3089 Other allergic rhinitis: Secondary | ICD-10-CM | POA: Diagnosis not present

## 2022-07-25 DIAGNOSIS — M25561 Pain in right knee: Secondary | ICD-10-CM | POA: Diagnosis not present

## 2022-07-25 DIAGNOSIS — M170 Bilateral primary osteoarthritis of knee: Secondary | ICD-10-CM | POA: Diagnosis not present

## 2022-07-25 DIAGNOSIS — G8929 Other chronic pain: Secondary | ICD-10-CM | POA: Diagnosis not present

## 2022-07-25 DIAGNOSIS — M25562 Pain in left knee: Secondary | ICD-10-CM | POA: Diagnosis not present

## 2022-07-25 DIAGNOSIS — M25461 Effusion, right knee: Secondary | ICD-10-CM | POA: Diagnosis not present

## 2022-07-26 DIAGNOSIS — J3081 Allergic rhinitis due to animal (cat) (dog) hair and dander: Secondary | ICD-10-CM | POA: Diagnosis not present

## 2022-07-26 DIAGNOSIS — J301 Allergic rhinitis due to pollen: Secondary | ICD-10-CM | POA: Diagnosis not present

## 2022-07-26 DIAGNOSIS — J3089 Other allergic rhinitis: Secondary | ICD-10-CM | POA: Diagnosis not present

## 2022-07-27 ENCOUNTER — Encounter: Payer: Self-pay | Admitting: Interventional Cardiology

## 2022-07-30 DIAGNOSIS — M0579 Rheumatoid arthritis with rheumatoid factor of multiple sites without organ or systems involvement: Secondary | ICD-10-CM | POA: Diagnosis not present

## 2022-08-01 DIAGNOSIS — J3089 Other allergic rhinitis: Secondary | ICD-10-CM | POA: Diagnosis not present

## 2022-08-01 DIAGNOSIS — J301 Allergic rhinitis due to pollen: Secondary | ICD-10-CM | POA: Diagnosis not present

## 2022-08-01 DIAGNOSIS — J3081 Allergic rhinitis due to animal (cat) (dog) hair and dander: Secondary | ICD-10-CM | POA: Diagnosis not present

## 2022-08-07 ENCOUNTER — Telehealth: Payer: Self-pay

## 2022-08-07 NOTE — Telephone Encounter (Signed)
Patient with diagnosis of afib on Eliquis for anticoagulation.    Procedure: bilateral knee injection Date of procedure: 08/23/22  CHA2DS2-VASc Score = 2  This indicates a 2.2% annual risk of stroke. The patient's score is based upon: CHF History: 0 HTN History: 0 Diabetes History: 0 Stroke History: 0 Vascular Disease History: 1 Age Score: 1 Gender Score: 0   CrCl 33m/min Platelet count 248K  Per office protocol, patient can hold Eliquis for 3 days prior to procedure.    **This guidance is not considered finalized until pre-operative APP has relayed final recommendations.**

## 2022-08-07 NOTE — Telephone Encounter (Signed)
   Name: Frederick Bridges  DOB: 17-Feb-1951  MRN: [id number]  Primary Cardiologist: Larae Grooms, MD   Preoperative team, please contact this patient and set up a phone call appointment for further preoperative risk assessment. Please obtain consent and complete medication review. Thank you for your help.  I confirm that guidance regarding antiplatelet and oral anticoagulation therapy has been completed and, if necessary, noted below.  Per office protocol, patient can hold Eliquis for 3 days prior to procedure.     Ledora Bottcher, PA 08/07/2022, 5:30 PM [place]

## 2022-08-07 NOTE — Telephone Encounter (Signed)
..     Pre-operative Risk Assessment    Patient Name: Frederick Bridges  DOB: 1951/07/26 MRN: [id number]      Request for Surgical Clearance    Procedure:   BILATERAL KNEE INJECTION  Date of Surgery:  Clearance 08/23/22                                 Surgeon:  DR Lenward Chancellor Surgeon's Group or Practice Name:  [facility] Phone number:  [phone] Fax number:  [phone]    Type of Clearance Requested:   - Medical  - Pharmacy:  Hold Apixaban (Eliquis)     Type of Anesthesia:  Not Indicated   Additional requests/questions:    Gwenlyn Found   08/07/2022, 11:06 AM

## 2022-08-08 NOTE — Telephone Encounter (Signed)
Left detailed VM per DPR requesting he callback to schedule phone visit.   Loel Dubonnet, NP

## 2022-08-09 ENCOUNTER — Ambulatory Visit (HOSPITAL_COMMUNITY): Payer: Medicare Other | Admitting: Physician Assistant

## 2022-08-09 ENCOUNTER — Encounter (HOSPITAL_COMMUNITY): Payer: Self-pay | Admitting: Nurse Practitioner

## 2022-08-09 ENCOUNTER — Telehealth: Payer: Self-pay | Admitting: *Deleted

## 2022-08-09 ENCOUNTER — Ambulatory Visit (HOSPITAL_COMMUNITY)
Admission: RE | Admit: 2022-08-09 | Discharge: 2022-08-09 | Disposition: A | Discharge status: Home / Self Care | Payer: Medicare Other | Source: Ambulatory Visit | Attending: Physician Assistant | Admitting: Physician Assistant

## 2022-08-09 VITALS — BP 126/76 | HR 76 | Ht 70.0 in | Wt 217.2 lb

## 2022-08-09 DIAGNOSIS — D6869 Other thrombophilia: Secondary | ICD-10-CM | POA: Diagnosis not present

## 2022-08-09 DIAGNOSIS — M069 Rheumatoid arthritis, unspecified: Secondary | ICD-10-CM | POA: Diagnosis not present

## 2022-08-09 DIAGNOSIS — I2510 Atherosclerotic heart disease of native coronary artery without angina pectoris: Secondary | ICD-10-CM | POA: Insufficient documentation

## 2022-08-09 DIAGNOSIS — Z8679 Personal history of other diseases of the circulatory system: Secondary | ICD-10-CM | POA: Insufficient documentation

## 2022-08-09 DIAGNOSIS — Z6831 Body mass index (BMI) 31.0-31.9, adult: Secondary | ICD-10-CM | POA: Insufficient documentation

## 2022-08-09 DIAGNOSIS — E669 Obesity, unspecified: Secondary | ICD-10-CM | POA: Diagnosis not present

## 2022-08-09 DIAGNOSIS — I4819 Other persistent atrial fibrillation: Secondary | ICD-10-CM | POA: Insufficient documentation

## 2022-08-09 DIAGNOSIS — Z7901 Long term (current) use of anticoagulants: Secondary | ICD-10-CM | POA: Insufficient documentation

## 2022-08-09 DIAGNOSIS — I4891 Unspecified atrial fibrillation: Secondary | ICD-10-CM | POA: Diagnosis not present

## 2022-08-09 DIAGNOSIS — G4733 Obstructive sleep apnea (adult) (pediatric): Secondary | ICD-10-CM | POA: Insufficient documentation

## 2022-08-09 NOTE — Telephone Encounter (Signed)
S/w pt to let pt know AFib clinic does not clear for surgery.  Pt stated after the telephone clearance was set up someone for Dr. Ernestene Kiel office called pt to let pt know he was cleared.  Stated if whom ever called cleared pt the telephone appt would be canceled.

## 2022-08-09 NOTE — Telephone Encounter (Signed)
     Primary Cardiologist: Larae Grooms, MD  Chart reviewed as part of pre-operative protocol coverage. Given past medical history and time since last visit, based on ACC/AHA guidelines, Frederick Bridges would be at acceptable risk for the planned procedure without further cardiovascular testing.   Patient was advised that if he develops new symptoms prior to surgery to contact our office to arrange a follow-up appointment.  He verbalized understanding.  Patient with diagnosis of afib on Eliquis for anticoagulation.     Procedure: bilateral knee injection Date of procedure: 08/23/22   CHA2DS2-VASc Score = 2  This indicates a 2.2% annual risk of stroke. The patient's score is based upon: CHF History: 0 HTN History: 0 Diabetes History: 0 Stroke History: 0 Vascular Disease History: 1 Age Score: 1 Gender Score: 0   CrCl 26m/min Platelet count 248K   Per office protocol, patient can hold Eliquis for 3 days prior to procedure.  I will route this recommendation to the requesting party via Epic fax function and remove from pre-op pool.  Please call with questions.  JJossie Ng Avaneesh Pepitone NP-C     08/09/2022, 2:30 PM [email or web address] Office [phone] ([phone]

## 2022-08-09 NOTE — Telephone Encounter (Signed)
Pt set up for telephone clearance but was seen by A-Fib clinic today.  Med rec and consent done.  If pt does not need appt due to today's appt please call pt and cancel appt.

## 2022-08-09 NOTE — Telephone Encounter (Signed)
  Patient Consent for Virtual Visit         Frederick Bridges has provided verbal consent on 08/09/2022 for a virtual visit (video or telephone).   CONSENT FOR VIRTUAL VISIT FOR:  Frederick Bridges  By participating in this virtual visit I agree to the following:  I hereby voluntarily request, consent and authorize [facility] and its employed or contracted physicians, physician assistants, nurse practitioners or other licensed health care professionals (the Practitioner), to provide me with telemedicine health care services (the "Services") as deemed necessary by the treating Practitioner. I acknowledge and consent to receive the Services by the Practitioner via telemedicine. I understand that the telemedicine visit will involve communicating with the Practitioner through live audiovisual communication technology and the disclosure of certain medical information by electronic transmission. I acknowledge that I have been given the opportunity to request an in-person assessment or other available alternative prior to the telemedicine visit and am voluntarily participating in the telemedicine visit.  I understand that I have the right to withhold or withdraw my consent to the use of telemedicine in the course of my care at any time, without affecting my right to future care or treatment, and that the Practitioner or I may terminate the telemedicine visit at any time. I understand that I have the right to inspect all information obtained and/or recorded in the course of the telemedicine visit and may receive copies of available information for a reasonable fee.  I understand that some of the potential risks of receiving the Services via telemedicine include:  Delay or interruption in medical evaluation due to technological equipment failure or disruption; Information transmitted may not be sufficient (e.g. poor resolution of images) to allow for appropriate medical decision making by the  Practitioner; and/or  In rare instances, security protocols could fail, causing a breach of personal health information.  Furthermore, I acknowledge that it is my responsibility to provide information about my medical history, conditions and care that is complete and accurate to the best of my ability. I acknowledge that Practitioner's advice, recommendations, and/or decision may be based on factors not within their control, such as incomplete or inaccurate data provided by me or distortions of diagnostic images or specimens that may result from electronic transmissions. I understand that the practice of medicine is not an exact science and that Practitioner makes no warranties or guarantees regarding treatment outcomes. I acknowledge that a copy of this consent can be made available to me via my patient portal ([facility]), or I can request a printed copy by calling the office of [facility].    I understand that my insurance will be billed for this visit.   I have read or had this consent read to me. I understand the contents of this consent, which adequately explains the benefits and risks of the Services being provided via telemedicine.  I have been provided ample opportunity to ask questions regarding this consent and the Services and have had my questions answered to my satisfaction. I give my informed consent for the services to be provided through the use of telemedicine in my medical care

## 2022-08-09 NOTE — Progress Notes (Signed)
Primary Care Physician: Donnajean Lopes, MD Primary Cardiologist: Dr [name]  Primary Electrophysiologist: none Referring Physician: Dr Gwynneth Macleod is a 71 y.o. male with a history of AAA, RA, OSA, CAD, and atrial fibrillation who presents for follow up in the [facility].  The patient was initially diagnosed with atrial fibrillation 01/2021 after presenting to his PCP for routine follow up. He is unaware of his arrhythmia and rate controlled. Patient is on Eliquis for a CHADS2VASC score of 1. He underwent DCCV on 02/20/21 which was unsuccessful. He does consume alcohol. He has an oral device for his OSA. Patient is s/p repeat DCCV on 03/10/21.   On follow up today, patient reports that he has done well since his last visit with no tachypalpitations. He is in SR today. He did have a cardiac CT which showed CAC score of 2179.   F/u in afib clinic, 08/09/22. Pt had flecainide stopped on last visit for in August for high calcium score with evidence of calcium in coronary arteries.He was offered AAD vrs ablation. Pt is back today and is staying in SR. He states as long as he is staying in [place], he would prefer to wait and watch and make a decision if and when afib returns.  He has cut back on alcohol use.  Today, he denies symptoms of palpitations, chest pain, shortness of breath, orthopnea, PND, lower extremity edema, dizziness, presyncope, syncope, bleeding, or neurologic sequela. The patient is tolerating medications without difficulties and is otherwise without complaint today.    Atrial Fibrillation Risk Factors:  he does have symptoms or diagnosis of sleep apnea. he is compliant with oral device for OSA. he does not have a history of rheumatic fever. he does have a history of alcohol use. The patient does not have a history of early familial atrial fibrillation or other arrhythmias.  he has a BMI of Body mass index is 31.16 kg/m.Marland Kitchen Filed  Weights   08/09/22 1014  Weight: 98.5 kg    Family History  Problem Relation Age of Onset   Lung cancer Mother    Lung cancer Brother    Colon cancer Neg Hx    Pancreatic cancer Neg Hx    Rectal cancer Neg Hx    Stomach cancer Neg Hx    Colon polyps Neg Hx    Esophageal cancer Neg Hx      Atrial Fibrillation Management history:  Previous antiarrhythmic drugs: flecainide Previous cardioversions: 02/20/21, 03/10/21 Previous ablations: none CHADS2VASC score: 2 Anticoagulation history: Eliquis   Past Medical History:  Diagnosis Date   A-fib ([place])    AAA (abdominal aortic aneurysm) (HCC)    Allergy    hayfever   Colon polyps    Hay fever    Nocturia    Personal history of colonic adenomas 12/22/2012   Rheumatoid arthritis ([place])    Sleep apnea    Dental Device   Wears glasses    Past Surgical History:  Procedure Laterality Date   CARDIOVERSION N/A 02/20/2021   Procedure: CARDIOVERSION;  Surgeon: Sueanne Margarita, MD;  Location: [facility];  Service: Cardiovascular;  Laterality: N/A;   CARDIOVERSION N/A 03/10/2021   Procedure: CARDIOVERSION;  Surgeon: Geralynn Rile, MD;  Location: [facility];  Service: Cardiovascular;  Laterality: N/A;   COLONOSCOPY  multiple   INGUINAL HERNIA REPAIR     PARATHYROIDECTOMY N/A 09/02/2015   Procedure: PARATHYROIDECTOMY;  Surgeon: Armandina Gemma, MD;  Location: [facility];  Service: General;  Laterality: N/A;   TONSILLECTOMY     as a child    Current Outpatient Medications  Medication Sig Dispense Refill   apixaban (ELIQUIS) 5 MG TABS tablet Take 1 tablet (5 mg total) by mouth 2 (two) times daily. 60 tablet 3   Ascorbic Acid (VITAMIN C) 1000 MG tablet Take 1,000 mg by mouth in the morning.     Azelastine HCl 0.15 % SOLN Place 2 sprays into both nostrils at bedtime.     cetirizine (ZYRTEC) 10 MG tablet Take 10 mg by mouth every evening.     CHELATED MAGNESIUM PO Take 100 mg by mouth in the morning and at bedtime.     Cholecalciferol  (VITAMIN D-3) 125 MCG (5000 UT) TABS Take 5,000 Units by mouth every evening.     EPINEPHrine 0.3 mg/0.3 mL IJ SOAJ injection Inject 0.3 mg into the muscle as needed for anaphylaxis.     ezetimibe (ZETIA) 10 MG tablet Take 1 tablet (10 mg total) by mouth daily. 90 tablet 3   finasteride (PROSCAR) 5 MG tablet Take 5 mg by mouth daily.     fluticasone (FLONASE) 50 MCG/ACT nasal spray Place 1 spray into both nostrils every morning.     hydroxychloroquine (PLAQUENIL) 200 MG tablet Take 200 mg by mouth 2 (two) times daily.     inFLIXimab (REMICADE IV) Once every 2 months     Menatetrenone (VITAMIN K2) 100 MCG TABS Take 100 mcg by mouth every evening.     Methylcobalamin 5000 MCG TBDP Take 5,000 mcg by mouth in the morning.     metoprolol succinate (TOPROL-XL) 25 MG 24 hr tablet Take 25 mg by mouth in the morning.     montelukast (SINGULAIR) 10 MG tablet Take 10 mg by mouth daily.     Multiple Vitamin (MULTIVITAMIN WITH MINERALS) TABS tablet Take 1 tablet by mouth in the morning.     naproxen sodium (ALEVE) 220 MG tablet Take 880 mg by mouth 2 (two) times a week.     NON FORMULARY Inject 1 Dose as directed once a week. Allergy shots     Olopatadine HCl 0.6 % SOLN SPRAY 2 SPRAYS INTO EACH NOSTRIL TWICE A DAY     Quercetin 500 MG CAPS Take 2 capsules by mouth daily.     rosuvastatin (CRESTOR) 20 MG tablet Take 1 tablet (20 mg total) by mouth daily. 90 tablet 3   tamsulosin (FLOMAX) 0.4 MG CAPS capsule Take 0.4 mg by mouth at bedtime.     TOCOPHEROLS-TOCOTRIENOLS PO Take 1 capsule by mouth every evening.     Ubiquinol 100 MG CAPS Take 100 mg by mouth in the morning.     Zinc 50 MG TABS Take 50 mg by mouth every evening.     co-enzyme Q-10 30 MG capsule Take 30 mg by mouth daily.     No current facility-administered medications for this encounter.    Allergies  Allergen Reactions   Prednisone     Other reaction(s): makes hyper/anxious   Ciprofloxacin Rash and Other (See Comments)    pain    Sulfa Antibiotics Rash    pain    Social History   Socioeconomic History   Marital status: Married    Spouse name: Not on file   Number of children: Not on file   Years of education: Not on file   Highest education level: Not on file  Occupational History   Occupation: retired    Fish farm manager: [employer]  Tobacco Use   Smoking status: Former    Types: Cigarettes    Quit date: 07/10/2008    Years since quitting: 14.0   Smokeless tobacco: Never   Tobacco comments:    Former smoker 05/03/2021  Vaping Use   Vaping Use: Never used  Substance and Sexual Activity   Alcohol use: Yes    Alcohol/week: 2.0 - 3.0 standard drinks of alcohol    Types: 2 - 3 Standard drinks or equivalent per week    Comment: 2-3 beers/wine weekly 05/09/22   Drug use: No   Sexual activity: Not on file  Other Topics Concern   Not on file  Social History Narrative   Left handed    Social Determinants of Health   Financial Resource Strain: Not on file  Food Insecurity: Not on file  Transportation Needs: Not on file  Physical Activity: Not on file  Stress: Not on file  Social Connections: Not on file  Intimate Partner Violence: Not on file     ROS- All systems are reviewed and negative except as per the HPI above.  Physical Exam: Vitals:   08/09/22 1014  BP: 126/76  Pulse: 76  Weight: 98.5 kg  Height: '5\' 10"'$  (1.778 m)     GEN- The patient is a well appearing male, alert and oriented x 3 today.   HEENT-head normocephalic, atraumatic, sclera clear, conjunctiva pink, hearing intact, trachea midline. Lungs- Clear to ausculation bilaterally, normal work of breathing Heart- Regular rate and rhythm, no murmurs, rubs or gallops  GI- soft, NT, ND, + BS Extremities- no clubbing, cyanosis, or edema MS- no significant deformity or atrophy Skin- no rash or lesion Psych- euthymic mood, full affect Neuro- strength and sensation are intact   Wt Readings from Last 3 Encounters:   08/09/22 98.5 kg  05/09/22 97.8 kg  01/29/22 98.9 kg    EKG today demonstrates  Vent. rate 76 BPM PR interval 144 ms QRS duration 96 ms QT/QTcB 374/420 ms P-R-T axes 55 92 52 Sinus rhythm with occasional Premature ventricular complexes Rightward axis Borderline ECG When compared with ECG of 09-May-2022 08:30, PREVIOUS ECG IS PRESENT  Echo 02/13/21 demonstrated  1. The mitral valve is grossly normal. Moderate mitral valve  regurgitation- mechanism appears to be atrial functional; there is  systolic blunting of the pulmonary vein flow. No evidence of mitral  stenosis.   2. Left atrial size was severely dilated.   3. Left ventricular ejection fraction, by estimation, is 50 to 55%. The  left ventricle has low normal function. The left ventricle has no regional wall motion abnormalities. Left ventricular diastolic parameters are indeterminate.   4. Right ventricular systolic function is normal. The right ventricular  size is normal. Tricuspid regurgitation signal is inadequate for assessing PA pressure.   5. Right atrial size was mildly dilated.   6. The aortic valve is tricuspid. There is mild calcification of the  aortic valve. There is mild thickening of the aortic valve. Aortic valve  regurgitation is not visualized. No aortic stenosis is present.   7. Aortic dilatation noted. There is mild dilatation of the aortic root,  measuring 41 mm.   8. The inferior vena cava is normal in size with greater than 50%  respiratory variability, suggesting right atrial pressure of 3 mmHg.   Epic records are reviewed at length today  CHA2DS2-VASc Score = 2  The patient's score is based upon: CHF History: 0 HTN History: 0 Diabetes History: 0 Stroke History: 0 Vascular Disease  History: 1 Age Score: 1 Gender Score: 0       ASSESSMENT AND PLAN: 1. Persistent Atrial Fibrillation (ICD10:  I48.19) The patient's CHA2DS2-VASc score is 2, indicating a 2.2% annual risk of stroke.   Patient  maintaining SR on flecainide, unfortunately he has a significantly elevated CAC score.  Marcene Brawn, PA, previously discussed the CAST trial and mutually determined that the risks of flecainide outweigh the benefits. Flecainide was stopped. Given his severely dilated LA, he will likely need another form of rhythm control at some point. We discussed alternate AAD although his options are limited because he is on hydroxychloroquine for his RA.. On young  side for amiodarone. There was a question of ILD, but saw pulmonary and it was thought it was not totally convincing for ILD    Hopefully, he has had positive remodeling of his LA in SR and would be a better ablation candidate. He would like to to continue to wait and watch and if and when afib returns, he will make a decision.   Continue Toprol 25 mg daily Will start Eliquis 5 mg BID with CV score of 2   2. Secondary Hypercoagulable State (ICD10:  D68.69) The patient is at significant risk for stroke/thromboembolism based upon his CHA2DS2-VASc Score of 2.  Start Apixaban (Eliquis).   3. Obesity Body mass index is 31.16 kg/m. Lifestyle modification was discussed and encouraged including regular physical activity and weight reduction.  4. Obstructive sleep apnea Encouraged compliance with oral device.  5. CAD CAC score of 2179 On statin and Zetia No anginal symptoms.   Follow up with Dr [name] per recall. AF clinic in 6 months   Geroge Baseman. Veldon Wager, [facility] [address] [place], [place] [address] [phone]

## 2022-08-10 DIAGNOSIS — J301 Allergic rhinitis due to pollen: Secondary | ICD-10-CM | POA: Diagnosis not present

## 2022-08-10 DIAGNOSIS — J3089 Other allergic rhinitis: Secondary | ICD-10-CM | POA: Diagnosis not present

## 2022-08-10 DIAGNOSIS — J3081 Allergic rhinitis due to animal (cat) (dog) hair and dander: Secondary | ICD-10-CM | POA: Diagnosis not present

## 2022-08-15 ENCOUNTER — Telehealth: Payer: Medicare Other

## 2022-08-16 DIAGNOSIS — J3089 Other allergic rhinitis: Secondary | ICD-10-CM | POA: Diagnosis not present

## 2022-08-16 DIAGNOSIS — J301 Allergic rhinitis due to pollen: Secondary | ICD-10-CM | POA: Diagnosis not present

## 2022-08-16 DIAGNOSIS — J3081 Allergic rhinitis due to animal (cat) (dog) hair and dander: Secondary | ICD-10-CM | POA: Diagnosis not present

## 2022-08-27 DIAGNOSIS — M0579 Rheumatoid arthritis with rheumatoid factor of multiple sites without organ or systems involvement: Secondary | ICD-10-CM | POA: Diagnosis not present

## 2022-09-11 DIAGNOSIS — J3089 Other allergic rhinitis: Secondary | ICD-10-CM | POA: Diagnosis not present

## 2022-09-11 DIAGNOSIS — J301 Allergic rhinitis due to pollen: Secondary | ICD-10-CM | POA: Diagnosis not present

## 2022-09-11 DIAGNOSIS — J3081 Allergic rhinitis due to animal (cat) (dog) hair and dander: Secondary | ICD-10-CM | POA: Diagnosis not present

## 2022-09-18 DIAGNOSIS — M47816 Spondylosis without myelopathy or radiculopathy, lumbar region: Secondary | ICD-10-CM | POA: Diagnosis not present

## 2022-09-18 DIAGNOSIS — L409 Psoriasis, unspecified: Secondary | ICD-10-CM | POA: Diagnosis not present

## 2022-09-18 DIAGNOSIS — M0579 Rheumatoid arthritis with rheumatoid factor of multiple sites without organ or systems involvement: Secondary | ICD-10-CM | POA: Diagnosis not present

## 2022-09-18 DIAGNOSIS — Z6831 Body mass index (BMI) 31.0-31.9, adult: Secondary | ICD-10-CM | POA: Diagnosis not present

## 2022-09-18 DIAGNOSIS — M25531 Pain in right wrist: Secondary | ICD-10-CM | POA: Diagnosis not present

## 2022-09-18 DIAGNOSIS — E669 Obesity, unspecified: Secondary | ICD-10-CM | POA: Diagnosis not present

## 2022-09-18 DIAGNOSIS — Z79899 Other long term (current) drug therapy: Secondary | ICD-10-CM | POA: Diagnosis not present

## 2022-09-20 DIAGNOSIS — J3089 Other allergic rhinitis: Secondary | ICD-10-CM | POA: Diagnosis not present

## 2022-09-20 DIAGNOSIS — J3081 Allergic rhinitis due to animal (cat) (dog) hair and dander: Secondary | ICD-10-CM | POA: Diagnosis not present

## 2022-09-20 DIAGNOSIS — J301 Allergic rhinitis due to pollen: Secondary | ICD-10-CM | POA: Diagnosis not present

## 2022-09-28 DIAGNOSIS — J3089 Other allergic rhinitis: Secondary | ICD-10-CM | POA: Diagnosis not present

## 2022-09-28 DIAGNOSIS — J301 Allergic rhinitis due to pollen: Secondary | ICD-10-CM | POA: Diagnosis not present

## 2022-09-28 DIAGNOSIS — J3081 Allergic rhinitis due to animal (cat) (dog) hair and dander: Secondary | ICD-10-CM | POA: Diagnosis not present

## 2022-10-04 DIAGNOSIS — J3081 Allergic rhinitis due to animal (cat) (dog) hair and dander: Secondary | ICD-10-CM | POA: Diagnosis not present

## 2022-10-04 DIAGNOSIS — J3089 Other allergic rhinitis: Secondary | ICD-10-CM | POA: Diagnosis not present

## 2022-10-04 DIAGNOSIS — J301 Allergic rhinitis due to pollen: Secondary | ICD-10-CM | POA: Diagnosis not present

## 2022-10-11 DIAGNOSIS — J3081 Allergic rhinitis due to animal (cat) (dog) hair and dander: Secondary | ICD-10-CM | POA: Diagnosis not present

## 2022-10-11 DIAGNOSIS — J301 Allergic rhinitis due to pollen: Secondary | ICD-10-CM | POA: Diagnosis not present

## 2022-10-11 DIAGNOSIS — J3089 Other allergic rhinitis: Secondary | ICD-10-CM | POA: Diagnosis not present

## 2022-10-18 ENCOUNTER — Encounter (HOSPITAL_COMMUNITY): Payer: Self-pay | Admitting: *Deleted

## 2022-10-18 DIAGNOSIS — J3081 Allergic rhinitis due to animal (cat) (dog) hair and dander: Secondary | ICD-10-CM | POA: Diagnosis not present

## 2022-10-18 DIAGNOSIS — J3089 Other allergic rhinitis: Secondary | ICD-10-CM | POA: Diagnosis not present

## 2022-10-18 DIAGNOSIS — J301 Allergic rhinitis due to pollen: Secondary | ICD-10-CM | POA: Diagnosis not present

## 2022-10-19 ENCOUNTER — Other Ambulatory Visit: Payer: Self-pay | Admitting: Interventional Cardiology

## 2022-10-22 DIAGNOSIS — M0579 Rheumatoid arthritis with rheumatoid factor of multiple sites without organ or systems involvement: Secondary | ICD-10-CM | POA: Diagnosis not present

## 2022-10-26 DIAGNOSIS — J3089 Other allergic rhinitis: Secondary | ICD-10-CM | POA: Diagnosis not present

## 2022-10-26 DIAGNOSIS — J3081 Allergic rhinitis due to animal (cat) (dog) hair and dander: Secondary | ICD-10-CM | POA: Diagnosis not present

## 2022-10-26 DIAGNOSIS — J301 Allergic rhinitis due to pollen: Secondary | ICD-10-CM | POA: Diagnosis not present

## 2022-10-29 DIAGNOSIS — Z08 Encounter for follow-up examination after completed treatment for malignant neoplasm: Secondary | ICD-10-CM | POA: Diagnosis not present

## 2022-10-29 DIAGNOSIS — D225 Melanocytic nevi of trunk: Secondary | ICD-10-CM | POA: Diagnosis not present

## 2022-10-29 DIAGNOSIS — Z85828 Personal history of other malignant neoplasm of skin: Secondary | ICD-10-CM | POA: Diagnosis not present

## 2022-10-29 DIAGNOSIS — L821 Other seborrheic keratosis: Secondary | ICD-10-CM | POA: Diagnosis not present

## 2022-10-29 DIAGNOSIS — L814 Other melanin hyperpigmentation: Secondary | ICD-10-CM | POA: Diagnosis not present

## 2022-10-29 DIAGNOSIS — L570 Actinic keratosis: Secondary | ICD-10-CM | POA: Diagnosis not present

## 2022-10-31 DIAGNOSIS — J3081 Allergic rhinitis due to animal (cat) (dog) hair and dander: Secondary | ICD-10-CM | POA: Diagnosis not present

## 2022-10-31 DIAGNOSIS — J3089 Other allergic rhinitis: Secondary | ICD-10-CM | POA: Diagnosis not present

## 2022-10-31 DIAGNOSIS — J301 Allergic rhinitis due to pollen: Secondary | ICD-10-CM | POA: Diagnosis not present

## 2022-11-08 DIAGNOSIS — J3081 Allergic rhinitis due to animal (cat) (dog) hair and dander: Secondary | ICD-10-CM | POA: Diagnosis not present

## 2022-11-08 DIAGNOSIS — J301 Allergic rhinitis due to pollen: Secondary | ICD-10-CM | POA: Diagnosis not present

## 2022-11-08 DIAGNOSIS — J3089 Other allergic rhinitis: Secondary | ICD-10-CM | POA: Diagnosis not present

## 2022-11-15 DIAGNOSIS — J301 Allergic rhinitis due to pollen: Secondary | ICD-10-CM | POA: Diagnosis not present

## 2022-11-15 DIAGNOSIS — J3089 Other allergic rhinitis: Secondary | ICD-10-CM | POA: Diagnosis not present

## 2022-11-15 DIAGNOSIS — J3081 Allergic rhinitis due to animal (cat) (dog) hair and dander: Secondary | ICD-10-CM | POA: Diagnosis not present

## 2022-11-20 ENCOUNTER — Other Ambulatory Visit: Payer: Self-pay | Admitting: Interventional Cardiology

## 2022-11-21 ENCOUNTER — Other Ambulatory Visit: Payer: Self-pay | Admitting: Interventional Cardiology

## 2022-11-21 DIAGNOSIS — J301 Allergic rhinitis due to pollen: Secondary | ICD-10-CM | POA: Diagnosis not present

## 2022-11-21 DIAGNOSIS — J3089 Other allergic rhinitis: Secondary | ICD-10-CM | POA: Diagnosis not present

## 2022-11-21 DIAGNOSIS — J3081 Allergic rhinitis due to animal (cat) (dog) hair and dander: Secondary | ICD-10-CM | POA: Diagnosis not present

## 2022-11-28 ENCOUNTER — Other Ambulatory Visit: Payer: Self-pay | Admitting: Interventional Cardiology

## 2022-11-28 DIAGNOSIS — J301 Allergic rhinitis due to pollen: Secondary | ICD-10-CM | POA: Diagnosis not present

## 2022-11-28 DIAGNOSIS — J3081 Allergic rhinitis due to animal (cat) (dog) hair and dander: Secondary | ICD-10-CM | POA: Diagnosis not present

## 2022-11-28 DIAGNOSIS — J3089 Other allergic rhinitis: Secondary | ICD-10-CM | POA: Diagnosis not present

## 2022-12-05 DIAGNOSIS — J301 Allergic rhinitis due to pollen: Secondary | ICD-10-CM | POA: Diagnosis not present

## 2022-12-05 DIAGNOSIS — J3089 Other allergic rhinitis: Secondary | ICD-10-CM | POA: Diagnosis not present

## 2022-12-05 DIAGNOSIS — J3081 Allergic rhinitis due to animal (cat) (dog) hair and dander: Secondary | ICD-10-CM | POA: Diagnosis not present

## 2022-12-08 ENCOUNTER — Other Ambulatory Visit: Payer: Self-pay | Admitting: Interventional Cardiology

## 2022-12-12 DIAGNOSIS — J3089 Other allergic rhinitis: Secondary | ICD-10-CM | POA: Diagnosis not present

## 2022-12-12 DIAGNOSIS — J301 Allergic rhinitis due to pollen: Secondary | ICD-10-CM | POA: Diagnosis not present

## 2022-12-12 DIAGNOSIS — J3081 Allergic rhinitis due to animal (cat) (dog) hair and dander: Secondary | ICD-10-CM | POA: Diagnosis not present

## 2022-12-17 ENCOUNTER — Other Ambulatory Visit (HOSPITAL_COMMUNITY): Payer: Self-pay | Admitting: Physician Assistant

## 2022-12-17 DIAGNOSIS — M0579 Rheumatoid arthritis with rheumatoid factor of multiple sites without organ or systems involvement: Secondary | ICD-10-CM | POA: Diagnosis not present

## 2022-12-17 DIAGNOSIS — Z79899 Other long term (current) drug therapy: Secondary | ICD-10-CM | POA: Diagnosis not present

## 2022-12-20 DIAGNOSIS — J3081 Allergic rhinitis due to animal (cat) (dog) hair and dander: Secondary | ICD-10-CM | POA: Diagnosis not present

## 2022-12-20 DIAGNOSIS — J3089 Other allergic rhinitis: Secondary | ICD-10-CM | POA: Diagnosis not present

## 2022-12-20 DIAGNOSIS — J301 Allergic rhinitis due to pollen: Secondary | ICD-10-CM | POA: Diagnosis not present

## 2022-12-27 DIAGNOSIS — J3089 Other allergic rhinitis: Secondary | ICD-10-CM | POA: Diagnosis not present

## 2022-12-27 DIAGNOSIS — J3081 Allergic rhinitis due to animal (cat) (dog) hair and dander: Secondary | ICD-10-CM | POA: Diagnosis not present

## 2022-12-27 DIAGNOSIS — J301 Allergic rhinitis due to pollen: Secondary | ICD-10-CM | POA: Diagnosis not present

## 2023-01-03 DIAGNOSIS — J3081 Allergic rhinitis due to animal (cat) (dog) hair and dander: Secondary | ICD-10-CM | POA: Diagnosis not present

## 2023-01-03 DIAGNOSIS — J301 Allergic rhinitis due to pollen: Secondary | ICD-10-CM | POA: Diagnosis not present

## 2023-01-03 DIAGNOSIS — J3089 Other allergic rhinitis: Secondary | ICD-10-CM | POA: Diagnosis not present

## 2023-01-09 DIAGNOSIS — J301 Allergic rhinitis due to pollen: Secondary | ICD-10-CM | POA: Diagnosis not present

## 2023-01-09 DIAGNOSIS — J3089 Other allergic rhinitis: Secondary | ICD-10-CM | POA: Diagnosis not present

## 2023-01-09 DIAGNOSIS — J3081 Allergic rhinitis due to animal (cat) (dog) hair and dander: Secondary | ICD-10-CM | POA: Diagnosis not present

## 2023-01-15 ENCOUNTER — Ambulatory Visit (HOSPITAL_BASED_OUTPATIENT_CLINIC_OR_DEPARTMENT_OTHER)
Admission: RE | Admit: 2023-01-15 | Discharge: 2023-01-15 | Disposition: A | Discharge status: Home / Self Care | Payer: Medicare Other | Source: Ambulatory Visit | Attending: Pulmonary Disease | Admitting: Pulmonary Disease

## 2023-01-15 ENCOUNTER — Encounter (HOSPITAL_BASED_OUTPATIENT_CLINIC_OR_DEPARTMENT_OTHER): Payer: Self-pay

## 2023-01-15 DIAGNOSIS — J849 Interstitial pulmonary disease, unspecified: Secondary | ICD-10-CM | POA: Insufficient documentation

## 2023-01-15 DIAGNOSIS — J301 Allergic rhinitis due to pollen: Secondary | ICD-10-CM | POA: Diagnosis not present

## 2023-01-15 DIAGNOSIS — J479 Bronchiectasis, uncomplicated: Secondary | ICD-10-CM | POA: Diagnosis not present

## 2023-01-15 DIAGNOSIS — J3089 Other allergic rhinitis: Secondary | ICD-10-CM | POA: Diagnosis not present

## 2023-01-15 DIAGNOSIS — J3081 Allergic rhinitis due to animal (cat) (dog) hair and dander: Secondary | ICD-10-CM | POA: Diagnosis not present

## 2023-01-15 DIAGNOSIS — J8410 Pulmonary fibrosis, unspecified: Secondary | ICD-10-CM | POA: Diagnosis not present

## 2023-01-21 ENCOUNTER — Encounter: Payer: Self-pay | Admitting: Interventional Cardiology

## 2023-01-21 ENCOUNTER — Other Ambulatory Visit: Payer: Self-pay | Admitting: Interventional Cardiology

## 2023-01-21 ENCOUNTER — Other Ambulatory Visit: Payer: Self-pay

## 2023-01-21 MED ORDER — ROSUVASTATIN CALCIUM 20 MG PO TABS: 20.0000 mg | ORAL_TABLET | Freq: Every day | ORAL | 0 refills | Status: DC

## 2023-01-21 MED ORDER — ROSUVASTATIN CALCIUM 20 MG PO TABS: 20.0000 mg | ORAL_TABLET | Freq: Every day | ORAL | 2 refills | Status: DC

## 2023-01-24 DIAGNOSIS — M25562 Pain in left knee: Secondary | ICD-10-CM | POA: Diagnosis not present

## 2023-01-24 DIAGNOSIS — Z6831 Body mass index (BMI) 31.0-31.9, adult: Secondary | ICD-10-CM | POA: Diagnosis not present

## 2023-01-24 DIAGNOSIS — M47816 Spondylosis without myelopathy or radiculopathy, lumbar region: Secondary | ICD-10-CM | POA: Diagnosis not present

## 2023-01-24 DIAGNOSIS — L409 Psoriasis, unspecified: Secondary | ICD-10-CM | POA: Diagnosis not present

## 2023-01-24 DIAGNOSIS — J3089 Other allergic rhinitis: Secondary | ICD-10-CM | POA: Diagnosis not present

## 2023-01-24 DIAGNOSIS — Z79899 Other long term (current) drug therapy: Secondary | ICD-10-CM | POA: Diagnosis not present

## 2023-01-24 DIAGNOSIS — M0579 Rheumatoid arthritis with rheumatoid factor of multiple sites without organ or systems involvement: Secondary | ICD-10-CM | POA: Diagnosis not present

## 2023-01-24 DIAGNOSIS — E663 Overweight: Secondary | ICD-10-CM | POA: Diagnosis not present

## 2023-01-24 DIAGNOSIS — J301 Allergic rhinitis due to pollen: Secondary | ICD-10-CM | POA: Diagnosis not present

## 2023-01-24 DIAGNOSIS — J3081 Allergic rhinitis due to animal (cat) (dog) hair and dander: Secondary | ICD-10-CM | POA: Diagnosis not present

## 2023-01-24 DIAGNOSIS — M25531 Pain in right wrist: Secondary | ICD-10-CM | POA: Diagnosis not present

## 2023-01-31 DIAGNOSIS — J3081 Allergic rhinitis due to animal (cat) (dog) hair and dander: Secondary | ICD-10-CM | POA: Diagnosis not present

## 2023-01-31 DIAGNOSIS — J3089 Other allergic rhinitis: Secondary | ICD-10-CM | POA: Diagnosis not present

## 2023-01-31 DIAGNOSIS — J301 Allergic rhinitis due to pollen: Secondary | ICD-10-CM | POA: Diagnosis not present

## 2023-02-05 ENCOUNTER — Ambulatory Visit (HOSPITAL_COMMUNITY)
Admission: RE | Admit: 2023-02-05 | Discharge: 2023-02-05 | Disposition: A | Discharge status: Home / Self Care | Payer: Medicare Other | Source: Ambulatory Visit | Attending: Physician Assistant | Admitting: Physician Assistant

## 2023-02-05 VITALS — BP 142/82 | HR 69 | Ht 70.0 in | Wt 219.0 lb

## 2023-02-05 DIAGNOSIS — I2510 Atherosclerotic heart disease of native coronary artery without angina pectoris: Secondary | ICD-10-CM | POA: Insufficient documentation

## 2023-02-05 DIAGNOSIS — I714 Abdominal aortic aneurysm, without rupture, unspecified: Secondary | ICD-10-CM | POA: Diagnosis not present

## 2023-02-05 DIAGNOSIS — D6869 Other thrombophilia: Secondary | ICD-10-CM | POA: Insufficient documentation

## 2023-02-05 DIAGNOSIS — I4819 Other persistent atrial fibrillation: Secondary | ICD-10-CM | POA: Insufficient documentation

## 2023-02-05 DIAGNOSIS — Z7901 Long term (current) use of anticoagulants: Secondary | ICD-10-CM | POA: Insufficient documentation

## 2023-02-05 DIAGNOSIS — G4733 Obstructive sleep apnea (adult) (pediatric): Secondary | ICD-10-CM | POA: Insufficient documentation

## 2023-02-05 NOTE — Progress Notes (Signed)
Primary Care Physician: Garlan Fillers, MD Primary Cardiologist: Dr Eldridge Dace  Primary Electrophysiologist: none Referring Physician: Dr Philippa Chester is a 72 y.o. male with a history of AAA, RA, OSA, CAD, and atrial fibrillation who presents for follow up in the [facility].  The patient was initially diagnosed with atrial fibrillation 01/2021 after presenting to his PCP for routine follow up. He is unaware of his arrhythmia and rate controlled. Patient is on Eliquis for a CHADS2VASC score of 1. He underwent DCCV on 02/20/21 which was unsuccessful. He does consume alcohol. He has an oral device for his OSA. Patient is s/p repeat DCCV on 03/10/21. Flecainide discontinued 04/2022 due to an elevated calcium score.   On follow up today, patient reports that he has done well since his last visit with no known episodes of afib. No bleeding issues on anticoagulation.   Today, he denies symptoms of palpitations, chest pain, shortness of breath, orthopnea, PND, lower extremity edema, dizziness, presyncope, syncope, bleeding, or neurologic sequela. The patient is tolerating medications without difficulties and is otherwise without complaint today.    Atrial Fibrillation Risk Factors:  he does have symptoms or diagnosis of sleep apnea. he is compliant with oral device for OSA. he does not have a history of rheumatic fever. he does have a history of alcohol use. The patient does not have a history of early familial atrial fibrillation or other arrhythmias.  he has a BMI of Body mass index is 31.42 kg/m.Marland Kitchen Filed Weights   02/05/23 0859  Weight: 99.3 kg   Family History  Problem Relation Age of Onset   Lung cancer Mother    Lung cancer Brother    Colon cancer Neg Hx    Pancreatic cancer Neg Hx    Rectal cancer Neg Hx    Stomach cancer Neg Hx    Colon polyps Neg Hx    Esophageal cancer Neg Hx      Atrial Fibrillation Management  history:  Previous antiarrhythmic drugs: flecainide Previous cardioversions: 02/20/21, 03/10/21 Previous ablations: none CHADS2VASC score: 2 Anticoagulation history: Eliquis   Past Medical History:  Diagnosis Date   A-fib (HCC)    AAA (abdominal aortic aneurysm) (HCC)    Allergy    hayfever   Colon polyps    Hay fever    Nocturia    Personal history of colonic adenomas 12/22/2012   Rheumatoid arthritis (HCC)    Sleep apnea    Dental Device   Wears glasses    Past Surgical History:  Procedure Laterality Date   CARDIOVERSION N/A 02/20/2021   Procedure: CARDIOVERSION;  Surgeon: Quintella Reichert, MD;  Location: [facility];  Service: Cardiovascular;  Laterality: N/A;   CARDIOVERSION N/A 03/10/2021   Procedure: CARDIOVERSION;  Surgeon: Sande Rives, MD;  Location: [facility] ENDOSCOPY;  Service: Cardiovascular;  Laterality: N/A;   COLONOSCOPY  multiple   INGUINAL HERNIA REPAIR     PARATHYROIDECTOMY N/A 09/02/2015   Procedure: PARATHYROIDECTOMY;  Surgeon: Darnell Level, MD;  Location: [facility] OR;  Service: General;  Laterality: N/A;   TONSILLECTOMY     as a child    Current Outpatient Medications  Medication Sig Dispense Refill   Ascorbic Acid (VITAMIN C) 1000 MG tablet Take 1,000 mg by mouth in the morning.     Azelastine HCl 0.15 % SOLN Place 2 sprays into both nostrils at bedtime.     cetirizine (ZYRTEC ALLERGY) 10 MG tablet Take 10 mg by mouth  daily.     Cholecalciferol (VITAMIN D-3) 125 MCG (5000 UT) TABS Take 5,000 Units by mouth every evening.     co-enzyme Q-10 30 MG capsule Take 30 mg by mouth daily.     ELIQUIS 5 MG TABS tablet TAKE 1 TABLET BY MOUTH TWICE A DAY 60 tablet 3   EPINEPHrine 0.3 mg/0.3 mL IJ SOAJ injection Inject 0.3 mg into the muscle as needed for anaphylaxis.     ezetimibe (ZETIA) 10 MG tablet TAKE 1 TABLET BY MOUTH EVERY DAY 90 tablet 3   fexofenadine (ALLEGRA) 180 MG tablet Take 180 mg by mouth daily.     finasteride (PROSCAR) 5 MG tablet Take 5 mg by mouth  daily.     fluticasone (FLONASE) 50 MCG/ACT nasal spray Place 1 spray into both nostrils every morning.     hydroxychloroquine (PLAQUENIL) 200 MG tablet Take 200 mg by mouth 2 (two) times daily.     inFLIXimab (REMICADE IV) Once every 2 months     Magnesium Citrate 100 MG CAPS Take 1 capsule by mouth every morning.     Menatetrenone (VITAMIN K2) 100 MCG TABS Take 100 mcg by mouth every evening.     Methylcobalamin 5000 MCG TBDP Take 5,000 mcg by mouth in the morning.     metoprolol succinate (TOPROL-XL) 25 MG 24 hr tablet Take 25 mg by mouth in the morning.     montelukast (SINGULAIR) 10 MG tablet Take 10 mg by mouth daily.     Multiple Vitamin (MULTI VITAMIN) TABS 1 tablet Orally Once a day for 30 day(s)     naproxen sodium (ALEVE) 220 MG tablet Take 3 tablets by mouth once a week.     NON FORMULARY Inject 1 Dose as directed once a week. Allergy shots     Olopatadine HCl 0.6 % SOLN SPRAY 2 SPRAYS INTO EACH NOSTRIL TWICE A DAY     Quercetin 500 MG CAPS Take 2 capsules by mouth daily.     rosuvastatin (CRESTOR) 20 MG tablet Take 1 tablet (20 mg total) by mouth daily. 90 tablet 2   tamsulosin (FLOMAX) 0.4 MG CAPS capsule Take 0.4 mg by mouth at bedtime.     TOCOPHEROLS-TOCOTRIENOLS PO Take 1 capsule by mouth every evening.     Turmeric (QC TUMERIC COMPLEX PO) Take 1 tablet by mouth every morning.     Ubiquinol 100 MG CAPS Take 100 mg by mouth in the morning.     Zinc 50 MG TABS Take 50 mg by mouth every evening.     No current facility-administered medications for this encounter.    Allergies  Allergen Reactions   Prednisone     Other reaction(s): makes hyper/anxious   Ciprofloxacin Rash and Other (See Comments)    pain   Sulfa Antibiotics Rash    pain    Social History   Socioeconomic History   Marital status: Married    Spouse name: Not on file   Number of children: Not on file   Years of education: Not on file   Highest education level: Not on file  Occupational History    Occupation: retired    Associate Professor: [employer]  Tobacco Use   Smoking status: Former    Types: Cigarettes    Quit date: 07/10/2008    Years since quitting: 14.5   Smokeless tobacco: Never   Tobacco comments:    Former smoker 05/03/2021  Vaping Use   Vaping Use: Never used  Substance and Sexual Activity  Alcohol use: Yes    Alcohol/week: 2.0 - 3.0 standard drinks of alcohol    Types: 2 - 3 Standard drinks or equivalent per week    Comment: 2-3 beers/wine weekly 05/09/22   Drug use: No   Sexual activity: Not on file  Other Topics Concern   Not on file  Social History Narrative   Left handed    Social Determinants of Health   Financial Resource Strain: Not on file  Food Insecurity: Not on file  Transportation Needs: Not on file  Physical Activity: Not on file  Stress: Not on file  Social Connections: Not on file  Intimate Partner Violence: Not on file     ROS- All systems are reviewed and negative except as per the HPI above.  Physical Exam: Vitals:   02/05/23 0859  BP: (!) 142/82  Pulse: 69  Weight: 99.3 kg  Height: 5\' 10"  (1.778 m)     GEN- The patient is a well appearing male, alert and oriented x 3 today.   HEENT-head normocephalic, atraumatic, sclera clear, conjunctiva pink, hearing intact, trachea midline. Lungs- Clear to ausculation bilaterally, normal work of breathing Heart- Regular rate and rhythm, no murmurs, rubs or gallops  GI- soft, NT, ND, + BS Extremities- no clubbing, cyanosis, or edema MS- no significant deformity or atrophy Skin- no rash or lesion Psych- euthymic mood, full affect Neuro- strength and sensation are intact   Wt Readings from Last 3 Encounters:  02/05/23 99.3 kg  08/09/22 98.5 kg  05/09/22 97.8 kg    EKG today demonstrates  SR Vent. rate 69 BPM PR interval 138 ms QRS duration 94 ms QT/QTcB 386/413 ms   Echo 02/13/21 demonstrated  1. The mitral valve is grossly normal. Moderate mitral valve   regurgitation- mechanism appears to be atrial functional; there is  systolic blunting of the pulmonary vein flow. No evidence of mitral  stenosis.   2. Left atrial size was severely dilated.   3. Left ventricular ejection fraction, by estimation, is 50 to 55%. The  left ventricle has low normal function. The left ventricle has no regional wall motion abnormalities. Left ventricular diastolic parameters are indeterminate.   4. Right ventricular systolic function is normal. The right ventricular  size is normal. Tricuspid regurgitation signal is inadequate for assessing PA pressure.   5. Right atrial size was mildly dilated.   6. The aortic valve is tricuspid. There is mild calcification of the  aortic valve. There is mild thickening of the aortic valve. Aortic valve  regurgitation is not visualized. No aortic stenosis is present.   7. Aortic dilatation noted. There is mild dilatation of the aortic root,  measuring 41 mm.   8. The inferior vena cava is normal in size with greater than 50%  respiratory variability, suggesting right atrial pressure of 3 mmHg.   Epic records are reviewed at length today  CHA2DS2-VASc Score = 2  The patient's score is based upon: CHF History: 0 HTN History: 0 Diabetes History: 0 Stroke History: 0 Vascular Disease History: 1 Age Score: 1 Gender Score: 0       ASSESSMENT AND PLAN: 1. Persistent Atrial Fibrillation (ICD10:  I48.19) The patient's CHA2DS2-VASc score is 2, indicating a 2.2% annual risk of stroke.   Patient appears to be maintaining SR.  AAD options are limited because he is on hydroxychloroquine for his RA. Amiodarone is also not ideal with questionable ILD, seen by Dr Isaiah Serge. Hopefully, he has had positive remodeling of his LA in  SR and would be an ablation candidate if needed.  Continue home monitoring with Fitbit and Kardia mobile.  Continue Toprol 25 mg daily Continue Eliquis 5 mg BID   2. Secondary Hypercoagulable State (ICD10:   D68.69) The patient is at significant risk for stroke/thromboembolism based upon his CHA2DS2-VASc Score of 2.  Continue Apixaban (Eliquis).   3. Obstructive sleep apnea Encouraged compliance with oral device.  4. CAD CAC score of 2179 On statin and Zetia No anginal symptoms.   Follow up with Dr Eldridge Dace as scheduled. AF clinic in one year.    Jorja Loa PA-C [facility] [address] [place], [place] [address] [phone] 02/05/2023 9:19 AM

## 2023-02-07 DIAGNOSIS — J3089 Other allergic rhinitis: Secondary | ICD-10-CM | POA: Diagnosis not present

## 2023-02-07 DIAGNOSIS — J3081 Allergic rhinitis due to animal (cat) (dog) hair and dander: Secondary | ICD-10-CM | POA: Diagnosis not present

## 2023-02-07 DIAGNOSIS — J301 Allergic rhinitis due to pollen: Secondary | ICD-10-CM | POA: Diagnosis not present

## 2023-02-11 DIAGNOSIS — M0579 Rheumatoid arthritis with rheumatoid factor of multiple sites without organ or systems involvement: Secondary | ICD-10-CM | POA: Diagnosis not present

## 2023-02-13 DIAGNOSIS — J3081 Allergic rhinitis due to animal (cat) (dog) hair and dander: Secondary | ICD-10-CM | POA: Diagnosis not present

## 2023-02-13 DIAGNOSIS — J3089 Other allergic rhinitis: Secondary | ICD-10-CM | POA: Diagnosis not present

## 2023-02-13 DIAGNOSIS — J301 Allergic rhinitis due to pollen: Secondary | ICD-10-CM | POA: Diagnosis not present

## 2023-02-13 DIAGNOSIS — J300 Vasomotor rhinitis: Secondary | ICD-10-CM | POA: Diagnosis not present

## 2023-02-21 DIAGNOSIS — J3081 Allergic rhinitis due to animal (cat) (dog) hair and dander: Secondary | ICD-10-CM | POA: Diagnosis not present

## 2023-02-21 DIAGNOSIS — J3089 Other allergic rhinitis: Secondary | ICD-10-CM | POA: Diagnosis not present

## 2023-02-21 DIAGNOSIS — J301 Allergic rhinitis due to pollen: Secondary | ICD-10-CM | POA: Diagnosis not present

## 2023-03-06 DIAGNOSIS — J3089 Other allergic rhinitis: Secondary | ICD-10-CM | POA: Diagnosis not present

## 2023-03-06 DIAGNOSIS — J3081 Allergic rhinitis due to animal (cat) (dog) hair and dander: Secondary | ICD-10-CM | POA: Diagnosis not present

## 2023-03-06 DIAGNOSIS — J301 Allergic rhinitis due to pollen: Secondary | ICD-10-CM | POA: Diagnosis not present

## 2023-03-13 DIAGNOSIS — J3081 Allergic rhinitis due to animal (cat) (dog) hair and dander: Secondary | ICD-10-CM | POA: Diagnosis not present

## 2023-03-13 DIAGNOSIS — J3089 Other allergic rhinitis: Secondary | ICD-10-CM | POA: Diagnosis not present

## 2023-03-13 DIAGNOSIS — J301 Allergic rhinitis due to pollen: Secondary | ICD-10-CM | POA: Diagnosis not present

## 2023-03-21 DIAGNOSIS — J301 Allergic rhinitis due to pollen: Secondary | ICD-10-CM | POA: Diagnosis not present

## 2023-03-21 DIAGNOSIS — J3081 Allergic rhinitis due to animal (cat) (dog) hair and dander: Secondary | ICD-10-CM | POA: Diagnosis not present

## 2023-03-21 DIAGNOSIS — J3089 Other allergic rhinitis: Secondary | ICD-10-CM | POA: Diagnosis not present

## 2023-03-27 DIAGNOSIS — J3081 Allergic rhinitis due to animal (cat) (dog) hair and dander: Secondary | ICD-10-CM | POA: Diagnosis not present

## 2023-03-27 DIAGNOSIS — J301 Allergic rhinitis due to pollen: Secondary | ICD-10-CM | POA: Diagnosis not present

## 2023-03-27 DIAGNOSIS — J3089 Other allergic rhinitis: Secondary | ICD-10-CM | POA: Diagnosis not present

## 2023-04-04 DIAGNOSIS — J3089 Other allergic rhinitis: Secondary | ICD-10-CM | POA: Diagnosis not present

## 2023-04-04 DIAGNOSIS — J301 Allergic rhinitis due to pollen: Secondary | ICD-10-CM | POA: Diagnosis not present

## 2023-04-04 DIAGNOSIS — J3081 Allergic rhinitis due to animal (cat) (dog) hair and dander: Secondary | ICD-10-CM | POA: Diagnosis not present

## 2023-04-10 DIAGNOSIS — M0579 Rheumatoid arthritis with rheumatoid factor of multiple sites without organ or systems involvement: Secondary | ICD-10-CM | POA: Diagnosis not present

## 2023-04-10 DIAGNOSIS — J301 Allergic rhinitis due to pollen: Secondary | ICD-10-CM | POA: Diagnosis not present

## 2023-04-10 DIAGNOSIS — J3081 Allergic rhinitis due to animal (cat) (dog) hair and dander: Secondary | ICD-10-CM | POA: Diagnosis not present

## 2023-04-10 DIAGNOSIS — J3089 Other allergic rhinitis: Secondary | ICD-10-CM | POA: Diagnosis not present

## 2023-04-17 DIAGNOSIS — J3081 Allergic rhinitis due to animal (cat) (dog) hair and dander: Secondary | ICD-10-CM | POA: Diagnosis not present

## 2023-04-17 DIAGNOSIS — J301 Allergic rhinitis due to pollen: Secondary | ICD-10-CM | POA: Diagnosis not present

## 2023-04-17 DIAGNOSIS — J3089 Other allergic rhinitis: Secondary | ICD-10-CM | POA: Diagnosis not present

## 2023-04-18 DIAGNOSIS — H0520 Unspecified exophthalmos: Secondary | ICD-10-CM | POA: Diagnosis not present

## 2023-04-18 DIAGNOSIS — M069 Rheumatoid arthritis, unspecified: Secondary | ICD-10-CM | POA: Diagnosis not present

## 2023-04-18 DIAGNOSIS — H2513 Age-related nuclear cataract, bilateral: Secondary | ICD-10-CM | POA: Diagnosis not present

## 2023-04-18 DIAGNOSIS — H1045 Other chronic allergic conjunctivitis: Secondary | ICD-10-CM | POA: Diagnosis not present

## 2023-04-18 DIAGNOSIS — H02834 Dermatochalasis of left upper eyelid: Secondary | ICD-10-CM | POA: Diagnosis not present

## 2023-04-18 DIAGNOSIS — H02831 Dermatochalasis of right upper eyelid: Secondary | ICD-10-CM | POA: Diagnosis not present

## 2023-04-18 DIAGNOSIS — H04123 Dry eye syndrome of bilateral lacrimal glands: Secondary | ICD-10-CM | POA: Diagnosis not present

## 2023-04-18 DIAGNOSIS — Z79899 Other long term (current) drug therapy: Secondary | ICD-10-CM | POA: Diagnosis not present

## 2023-04-24 DIAGNOSIS — J3081 Allergic rhinitis due to animal (cat) (dog) hair and dander: Secondary | ICD-10-CM | POA: Diagnosis not present

## 2023-04-24 DIAGNOSIS — J3089 Other allergic rhinitis: Secondary | ICD-10-CM | POA: Diagnosis not present

## 2023-04-24 DIAGNOSIS — J301 Allergic rhinitis due to pollen: Secondary | ICD-10-CM | POA: Diagnosis not present

## 2023-04-27 ENCOUNTER — Other Ambulatory Visit (HOSPITAL_COMMUNITY): Payer: Self-pay | Admitting: Physician Assistant

## 2023-04-30 DIAGNOSIS — C44529 Squamous cell carcinoma of skin of other part of trunk: Secondary | ICD-10-CM | POA: Diagnosis not present

## 2023-04-30 DIAGNOSIS — L821 Other seborrheic keratosis: Secondary | ICD-10-CM | POA: Diagnosis not present

## 2023-04-30 DIAGNOSIS — D492 Neoplasm of unspecified behavior of bone, soft tissue, and skin: Secondary | ICD-10-CM | POA: Diagnosis not present

## 2023-04-30 DIAGNOSIS — C44319 Basal cell carcinoma of skin of other parts of face: Secondary | ICD-10-CM | POA: Diagnosis not present

## 2023-04-30 DIAGNOSIS — L814 Other melanin hyperpigmentation: Secondary | ICD-10-CM | POA: Diagnosis not present

## 2023-04-30 DIAGNOSIS — D225 Melanocytic nevi of trunk: Secondary | ICD-10-CM | POA: Diagnosis not present

## 2023-04-30 DIAGNOSIS — L570 Actinic keratosis: Secondary | ICD-10-CM | POA: Diagnosis not present

## 2023-05-01 DIAGNOSIS — J301 Allergic rhinitis due to pollen: Secondary | ICD-10-CM | POA: Diagnosis not present

## 2023-05-01 DIAGNOSIS — J3089 Other allergic rhinitis: Secondary | ICD-10-CM | POA: Diagnosis not present

## 2023-05-01 DIAGNOSIS — J3081 Allergic rhinitis due to animal (cat) (dog) hair and dander: Secondary | ICD-10-CM | POA: Diagnosis not present

## 2023-05-08 DIAGNOSIS — J301 Allergic rhinitis due to pollen: Secondary | ICD-10-CM | POA: Diagnosis not present

## 2023-05-08 DIAGNOSIS — J3089 Other allergic rhinitis: Secondary | ICD-10-CM | POA: Diagnosis not present

## 2023-05-08 DIAGNOSIS — J3081 Allergic rhinitis due to animal (cat) (dog) hair and dander: Secondary | ICD-10-CM | POA: Diagnosis not present

## 2023-05-15 DIAGNOSIS — J3089 Other allergic rhinitis: Secondary | ICD-10-CM | POA: Diagnosis not present

## 2023-05-15 DIAGNOSIS — J3081 Allergic rhinitis due to animal (cat) (dog) hair and dander: Secondary | ICD-10-CM | POA: Diagnosis not present

## 2023-05-15 DIAGNOSIS — J301 Allergic rhinitis due to pollen: Secondary | ICD-10-CM | POA: Diagnosis not present

## 2023-05-22 DIAGNOSIS — J3081 Allergic rhinitis due to animal (cat) (dog) hair and dander: Secondary | ICD-10-CM | POA: Diagnosis not present

## 2023-05-22 DIAGNOSIS — J301 Allergic rhinitis due to pollen: Secondary | ICD-10-CM | POA: Diagnosis not present

## 2023-05-22 DIAGNOSIS — J3089 Other allergic rhinitis: Secondary | ICD-10-CM | POA: Diagnosis not present

## 2023-05-29 ENCOUNTER — Other Ambulatory Visit: Payer: Self-pay | Admitting: *Deleted

## 2023-05-29 DIAGNOSIS — I7143 Infrarenal abdominal aortic aneurysm, without rupture: Secondary | ICD-10-CM

## 2023-06-03 DIAGNOSIS — C44319 Basal cell carcinoma of skin of other parts of face: Secondary | ICD-10-CM | POA: Diagnosis not present

## 2023-06-05 ENCOUNTER — Ambulatory Visit: Payer: Medicare Other

## 2023-06-05 ENCOUNTER — Ambulatory Visit: Payer: Medicare Other | Admitting: Physician Assistant

## 2023-06-05 ENCOUNTER — Ambulatory Visit (HOSPITAL_COMMUNITY)
Admission: RE | Admit: 2023-06-05 | Discharge: 2023-06-05 | Disposition: A | Discharge status: Home / Self Care | Payer: Medicare Other | Source: Ambulatory Visit | Attending: Vascular Surgery | Admitting: Vascular Surgery

## 2023-06-05 DIAGNOSIS — I7143 Infrarenal abdominal aortic aneurysm, without rupture: Secondary | ICD-10-CM | POA: Insufficient documentation

## 2023-06-05 DIAGNOSIS — R5383 Other fatigue: Secondary | ICD-10-CM | POA: Diagnosis not present

## 2023-06-05 DIAGNOSIS — Z79899 Other long term (current) drug therapy: Secondary | ICD-10-CM | POA: Diagnosis not present

## 2023-06-05 DIAGNOSIS — Z111 Encounter for screening for respiratory tuberculosis: Secondary | ICD-10-CM | POA: Diagnosis not present

## 2023-06-05 DIAGNOSIS — M0579 Rheumatoid arthritis with rheumatoid factor of multiple sites without organ or systems involvement: Secondary | ICD-10-CM | POA: Diagnosis not present

## 2023-06-05 NOTE — Progress Notes (Signed)
    Virtual Visit via Telephone Note   I connected with Frederick Bridges on 06/05/2023 using the [employer] by telephone and verified that I was speaking with the correct person using two identifiers. Patient was located at home and accompanied by none. I am located at [facility]   The limitations of evaluation and management by telemedicine and the availability of in person appointments have been previously discussed with the patient and are documented in the patients chart. The patient expressed understanding and consented to proceed.  PCP: Garlan Fillers, MD  Chief Complaint: AAA surveillance  History of Present Illness: Frederick Bridges is a 72 y.o. male who returns to clinic for surveillance of AAA.  He denies any new or changing abd or back pain.  He is also without rest pain or tissue changes of BLE.  He is on Eliquis for atrial fibrillation.  He follows regularly with his PCP for management of chronic medical conditions including hyperlipidemia.  He is a former smoker.  Past Medical History:  Diagnosis Date   A-fib [facility])    AAA (abdominal aortic aneurysm) (HCC)    Allergy    hayfever   Colon polyps    Hay fever    Nocturia    Personal history of colonic adenomas 12/22/2012   Rheumatoid arthritis (HCC)    Sleep apnea    Dental Device   Wears glasses     Past Surgical History:  Procedure Laterality Date   CARDIOVERSION N/A 02/20/2021   Procedure: CARDIOVERSION;  Surgeon: Quintella Reichert, MD;  Location: [facility] ENDOSCOPY;  Service: Cardiovascular;  Laterality: N/A;   CARDIOVERSION N/A 03/10/2021   Procedure: CARDIOVERSION;  Surgeon: Sande Rives, MD;  Location: [facility] ENDOSCOPY;  Service: Cardiovascular;  Laterality: N/A;   COLONOSCOPY  multiple   INGUINAL HERNIA REPAIR     PARATHYROIDECTOMY N/A 09/02/2015   Procedure: PARATHYROIDECTOMY;  Surgeon: Darnell Level, MD;  Location: [facility] OR;  Service: General;  Laterality: N/A;   TONSILLECTOMY     as a child    No outpatient medications  have been marked as taking for the 06/05/23 encounter (Appointment) with Emilie Rutter, PA-C.    12 system ROS was negative unless otherwise noted in HPI   Observations/Objective: AAA duplex demonstrates largest diameter of 3 cm which is stable compared to 2 years ago Right common iliac artery diameter 1.5 Left common iliac artery diameter 1.6  Assessment and Plan: [name] is a 72 year old male last seen in the office about 2 years ago.  He had a AAA duplex this morning which demonstrated a stable AAA measuring 3 cm in maximal diameter.  Iliac arteries also measuring stable at 1.5 cm.  He will continue to follow regularly with his PCP for chronic medical conditions.  We will repeat AAA duplex in 2 years.  He will call/return office sooner with any questions or concerns.  Follow Up Instructions:   Follow up in 2 year(s)   I discussed the assessment and treatment plan with the patient. The patient was provided an opportunity to ask questions and all were answered. The patient agreed with the plan and demonstrated an understanding of the instructions.   The patient was advised to call back or seek an in-person evaluation if the symptoms worsen or if the condition fails to improve as anticipated.  I spent 5 minutes with the patient via telephone encounter.   SignedEmilie Rutter [facility] Office: [phone]  06/05/2023, 1:01 PM

## 2023-06-06 DIAGNOSIS — Z1212 Encounter for screening for malignant neoplasm of rectum: Secondary | ICD-10-CM | POA: Diagnosis not present

## 2023-06-06 DIAGNOSIS — R739 Hyperglycemia, unspecified: Secondary | ICD-10-CM | POA: Diagnosis not present

## 2023-06-06 DIAGNOSIS — I10 Essential (primary) hypertension: Secondary | ICD-10-CM | POA: Diagnosis not present

## 2023-06-06 DIAGNOSIS — M8580 Other specified disorders of bone density and structure, unspecified site: Secondary | ICD-10-CM | POA: Diagnosis not present

## 2023-06-06 DIAGNOSIS — E785 Hyperlipidemia, unspecified: Secondary | ICD-10-CM | POA: Diagnosis not present

## 2023-06-10 DIAGNOSIS — Z4802 Encounter for removal of sutures: Secondary | ICD-10-CM | POA: Diagnosis not present

## 2023-06-10 DIAGNOSIS — D045 Carcinoma in situ of skin of trunk: Secondary | ICD-10-CM | POA: Diagnosis not present

## 2023-06-13 DIAGNOSIS — J3089 Other allergic rhinitis: Secondary | ICD-10-CM | POA: Diagnosis not present

## 2023-06-13 DIAGNOSIS — E785 Hyperlipidemia, unspecified: Secondary | ICD-10-CM | POA: Diagnosis not present

## 2023-06-13 DIAGNOSIS — G4733 Obstructive sleep apnea (adult) (pediatric): Secondary | ICD-10-CM | POA: Diagnosis not present

## 2023-06-13 DIAGNOSIS — J301 Allergic rhinitis due to pollen: Secondary | ICD-10-CM | POA: Diagnosis not present

## 2023-06-13 DIAGNOSIS — J3081 Allergic rhinitis due to animal (cat) (dog) hair and dander: Secondary | ICD-10-CM | POA: Diagnosis not present

## 2023-06-13 DIAGNOSIS — I2510 Atherosclerotic heart disease of native coronary artery without angina pectoris: Secondary | ICD-10-CM | POA: Diagnosis not present

## 2023-06-13 DIAGNOSIS — I10 Essential (primary) hypertension: Secondary | ICD-10-CM | POA: Diagnosis not present

## 2023-06-13 DIAGNOSIS — R82998 Other abnormal findings in urine: Secondary | ICD-10-CM | POA: Diagnosis not present

## 2023-06-13 DIAGNOSIS — R739 Hyperglycemia, unspecified: Secondary | ICD-10-CM | POA: Diagnosis not present

## 2023-06-13 DIAGNOSIS — Z0000 Encounter for general adult medical examination without abnormal findings: Secondary | ICD-10-CM | POA: Diagnosis not present

## 2023-06-13 DIAGNOSIS — I4891 Unspecified atrial fibrillation: Secondary | ICD-10-CM | POA: Diagnosis not present

## 2023-06-19 ENCOUNTER — Ambulatory Visit: Payer: Medicare Other | Admitting: Interventional Cardiology

## 2023-06-20 DIAGNOSIS — J3081 Allergic rhinitis due to animal (cat) (dog) hair and dander: Secondary | ICD-10-CM | POA: Diagnosis not present

## 2023-06-20 DIAGNOSIS — J3089 Other allergic rhinitis: Secondary | ICD-10-CM | POA: Diagnosis not present

## 2023-06-20 DIAGNOSIS — J301 Allergic rhinitis due to pollen: Secondary | ICD-10-CM | POA: Diagnosis not present

## 2023-07-08 DIAGNOSIS — J3081 Allergic rhinitis due to animal (cat) (dog) hair and dander: Secondary | ICD-10-CM | POA: Diagnosis not present

## 2023-07-08 DIAGNOSIS — J301 Allergic rhinitis due to pollen: Secondary | ICD-10-CM | POA: Diagnosis not present

## 2023-07-08 DIAGNOSIS — J3089 Other allergic rhinitis: Secondary | ICD-10-CM | POA: Diagnosis not present

## 2023-07-22 DIAGNOSIS — J301 Allergic rhinitis due to pollen: Secondary | ICD-10-CM | POA: Diagnosis not present

## 2023-07-22 DIAGNOSIS — J3081 Allergic rhinitis due to animal (cat) (dog) hair and dander: Secondary | ICD-10-CM | POA: Diagnosis not present

## 2023-07-22 DIAGNOSIS — J3089 Other allergic rhinitis: Secondary | ICD-10-CM | POA: Diagnosis not present

## 2023-07-23 ENCOUNTER — Encounter: Payer: Self-pay | Admitting: Physician Assistant

## 2023-07-24 DIAGNOSIS — M47816 Spondylosis without myelopathy or radiculopathy, lumbar region: Secondary | ICD-10-CM | POA: Diagnosis not present

## 2023-07-24 DIAGNOSIS — Z6831 Body mass index (BMI) 31.0-31.9, adult: Secondary | ICD-10-CM | POA: Diagnosis not present

## 2023-07-24 DIAGNOSIS — M0579 Rheumatoid arthritis with rheumatoid factor of multiple sites without organ or systems involvement: Secondary | ICD-10-CM | POA: Diagnosis not present

## 2023-07-24 DIAGNOSIS — M1991 Primary osteoarthritis, unspecified site: Secondary | ICD-10-CM | POA: Diagnosis not present

## 2023-07-24 DIAGNOSIS — L409 Psoriasis, unspecified: Secondary | ICD-10-CM | POA: Diagnosis not present

## 2023-07-24 DIAGNOSIS — Z79899 Other long term (current) drug therapy: Secondary | ICD-10-CM | POA: Diagnosis not present

## 2023-07-24 DIAGNOSIS — E669 Obesity, unspecified: Secondary | ICD-10-CM | POA: Diagnosis not present

## 2023-08-01 DIAGNOSIS — M0579 Rheumatoid arthritis with rheumatoid factor of multiple sites without organ or systems involvement: Secondary | ICD-10-CM | POA: Diagnosis not present

## 2023-08-02 DIAGNOSIS — J301 Allergic rhinitis due to pollen: Secondary | ICD-10-CM | POA: Diagnosis not present

## 2023-08-02 DIAGNOSIS — J3089 Other allergic rhinitis: Secondary | ICD-10-CM | POA: Diagnosis not present

## 2023-08-02 DIAGNOSIS — J3081 Allergic rhinitis due to animal (cat) (dog) hair and dander: Secondary | ICD-10-CM | POA: Diagnosis not present

## 2023-08-12 NOTE — Progress Notes (Unsigned)
Cardiology Office Note:  .   Date:  08/14/2023  ID:  Frederick Bridges, DOB 1951/07/29, MRN [id number] PCP: Garlan Fillers, MD  [facility] Providers Cardiologist:  Lance Muss, MD    History of Present Illness: Frederick Bridges is a 72 y.o. male with history of CAD, persistent Afib s/p DCCV, ILD, HLD, AAA who presents for follow-up.   History of Present Illness   Frederick Bridges, a 72 year old male with a history of elevated coronary calcium score, persistent atrial fibrillation status post cardioversion, interstitial lung disease, moderate mitral valve regurgitation, and abdominal aortic aneurysm, presents for a routine follow-up. The patient expresses concern about the number of medications he is taking and the side effects, particularly bruising. He is currently on Eliquis, metoprolol, rosuvastatin (Crestor), and Zetia. The patient has no symptoms of chest pain or trouble breathing and is generally active, playing golf regularly. He has a history of smoking but quit in 2009. The patient also has rheumatoid arthritis for which he takes Remicade. No CP or SOB reported.          Problem List Persistent Afib -CHADSVASC=3 (age, CAD) -DCCV 02/20/2021 2. CAD -CAC 2179 (95th percentile) 3. AAA -3.0 cm 06/05/2023 4. Mitral regurgitation, moderate  5. HLD -T chol 126, HDL 41, LDL 68, TG 91 6. Interstitial lung disease  7. RA    ROS: All other ROS reviewed and negative. Pertinent positives noted in the HPI.     Studies Reviewed: Marland Kitchen       Physical Exam:   VS:  BP 124/82 (BP Location: Left Arm, Patient Position: Sitting, Cuff Size: Normal)   Pulse 67   Ht 5\' 10"  (1.778 m)   Wt 215 lb 6.4 oz (97.7 kg)   SpO2 97%   BMI 30.91 kg/m    Wt Readings from Last 3 Encounters:  08/14/23 215 lb 6.4 oz (97.7 kg)  02/05/23 219 lb (99.3 kg)  08/09/22 217 lb 3.2 oz (98.5 kg)    GEN: Well nourished, well developed in no acute distress NECK: No JVD; No carotid bruits CARDIAC: RRR, no  murmurs, rubs, gallops RESPIRATORY:  Clear to auscultation without rales, wheezing or rhonchi  ABDOMEN: Soft, non-tender, non-distended EXTREMITIES:  No edema; No deformity  ASSESSMENT AND PLAN: .   Assessment and Plan    Atrial Fibrillation Maintaining sinus rhythm since cardioversion in 2022. Bruising noted, likely secondary to Eliquis. Discussed Watchman procedure as an alternative to long-term anticoagulation, but patient prefers to continue current regimen. -Continue Eliquis 5mg  BID. -Continue Metoprolol Succinate 25mg  daily. -Annual follow-up or sooner if needed.  Elevated Coronary Calcium Score Score of 2179 (95th percentile). No symptoms of ischemia. LDL cholesterol at goal on current regimen. -Continue Rosuvastatin (Crestor) 20mg  daily. -Continue Ezetimibe (Zetia) 10mg  daily.  Mitral Valve Regurgitation No symptoms reported. No murmurs noted on examination. -Schedule echocardiogram to assess for any changes.  General Health Maintenance -Encouraged to increase physical activity. -Annual follow-up or sooner if needed.              Follow-up: Return in about 1 year (around 08/13/2024).  Time Spent with Patient: I have spent a total of 25 minutes caring for this patient today face to face, ordering and reviewing labs/tests, reviewing prior records/medical history, examining the patient, establishing an assessment and plan, communicating results/findings to the patient/family, and documenting in the medical record.   Signed, Lenna Gilford. Flora Lipps, MD, [facility]  [facility]  [address], [address]  [place], [place] [address] [phone]  10:51 AM

## 2023-08-14 ENCOUNTER — Encounter: Payer: Self-pay | Admitting: Cardiovascular Disease

## 2023-08-14 ENCOUNTER — Ambulatory Visit: Payer: Medicare Other | Attending: Interventional Cardiology | Admitting: Cardiovascular Disease

## 2023-08-14 VITALS — BP 124/82 | HR 67 | Ht 70.0 in | Wt 215.4 lb

## 2023-08-14 DIAGNOSIS — E782 Mixed hyperlipidemia: Secondary | ICD-10-CM | POA: Diagnosis not present

## 2023-08-14 DIAGNOSIS — I2510 Atherosclerotic heart disease of native coronary artery without angina pectoris: Secondary | ICD-10-CM | POA: Insufficient documentation

## 2023-08-14 DIAGNOSIS — I7143 Infrarenal abdominal aortic aneurysm, without rupture: Secondary | ICD-10-CM | POA: Insufficient documentation

## 2023-08-14 DIAGNOSIS — R931 Abnormal findings on diagnostic imaging of heart and coronary circulation: Secondary | ICD-10-CM | POA: Diagnosis not present

## 2023-08-14 DIAGNOSIS — I4819 Other persistent atrial fibrillation: Secondary | ICD-10-CM | POA: Diagnosis not present

## 2023-08-14 DIAGNOSIS — I340 Nonrheumatic mitral (valve) insufficiency: Secondary | ICD-10-CM | POA: Diagnosis not present

## 2023-08-14 NOTE — Patient Instructions (Signed)
Medication Instructions:  Your physician recommends that you continue on your current medications as directed. Please refer to the Current Medication list given to you today.    *If you need a refill on your cardiac medications before your next appointment, please call your pharmacy*   Lab Work: NONE    If you have labs (blood work) drawn today and your tests are completely normal, you will receive your results only by: MyChart Message (if you have MyChart) OR A paper copy in the mail If you have any lab test that is abnormal or we need to change your treatment, we will call you to review the results.   Testing/Procedures: Echo will be scheduled at [address].  Your physician has requested that you have an echocardiogram. Echocardiography is a painless test that uses sound waves to create images of your heart. It provides your doctor with information about the size and shape of your heart and how well your heart's chambers and valves are working. This procedure takes approximately one hour. There are no restrictions for this procedure. Please do NOT wear cologne, perfume, aftershave, or lotions (deodorant is allowed). Please arrive 15 minutes prior to your appointment time.    Follow-Up: At [facility], you and your health needs are our priority.  As part of our continuing mission to provide you with exceptional heart care, we have created designated Provider Care Teams.  These Care Teams include your primary Cardiologist (physician) and Advanced Practice Providers (APPs -  Physician Assistants and Nurse Practitioners) who all work together to provide you with the care you need, when you need it.  We recommend signing up for the patient portal called "MyChart".  Sign up information is provided on this After Visit Summary.  MyChart is used to connect with patients for Virtual Visits (Telemedicine).  Patients are able to view lab/test results, encounter notes, upcoming  appointments, etc.  Non-urgent messages can be sent to your provider as well.   To learn more about what you can do with MyChart, go to [email or web address].    Your next appointment:   1 year(s)  The format for your next appointment:   In Person  Provider:   Lennie Odor, MD   Other Instructions

## 2023-08-16 DIAGNOSIS — J3081 Allergic rhinitis due to animal (cat) (dog) hair and dander: Secondary | ICD-10-CM | POA: Diagnosis not present

## 2023-08-16 DIAGNOSIS — J301 Allergic rhinitis due to pollen: Secondary | ICD-10-CM | POA: Diagnosis not present

## 2023-08-16 DIAGNOSIS — J3089 Other allergic rhinitis: Secondary | ICD-10-CM | POA: Diagnosis not present

## 2023-08-23 DIAGNOSIS — J3081 Allergic rhinitis due to animal (cat) (dog) hair and dander: Secondary | ICD-10-CM | POA: Diagnosis not present

## 2023-08-23 DIAGNOSIS — J3089 Other allergic rhinitis: Secondary | ICD-10-CM | POA: Diagnosis not present

## 2023-08-23 DIAGNOSIS — J301 Allergic rhinitis due to pollen: Secondary | ICD-10-CM | POA: Diagnosis not present

## 2023-08-26 DIAGNOSIS — J3081 Allergic rhinitis due to animal (cat) (dog) hair and dander: Secondary | ICD-10-CM | POA: Diagnosis not present

## 2023-08-26 DIAGNOSIS — J300 Vasomotor rhinitis: Secondary | ICD-10-CM | POA: Diagnosis not present

## 2023-08-26 DIAGNOSIS — J301 Allergic rhinitis due to pollen: Secondary | ICD-10-CM | POA: Diagnosis not present

## 2023-08-26 DIAGNOSIS — J3089 Other allergic rhinitis: Secondary | ICD-10-CM | POA: Diagnosis not present

## 2023-09-05 DIAGNOSIS — J301 Allergic rhinitis due to pollen: Secondary | ICD-10-CM | POA: Diagnosis not present

## 2023-09-05 DIAGNOSIS — J3081 Allergic rhinitis due to animal (cat) (dog) hair and dander: Secondary | ICD-10-CM | POA: Diagnosis not present

## 2023-09-05 DIAGNOSIS — J3089 Other allergic rhinitis: Secondary | ICD-10-CM | POA: Diagnosis not present

## 2023-09-16 ENCOUNTER — Other Ambulatory Visit: Payer: Self-pay

## 2023-09-16 ENCOUNTER — Encounter: Payer: Self-pay | Admitting: Cardiovascular Disease

## 2023-09-16 DIAGNOSIS — J301 Allergic rhinitis due to pollen: Secondary | ICD-10-CM | POA: Diagnosis not present

## 2023-09-16 DIAGNOSIS — J3089 Other allergic rhinitis: Secondary | ICD-10-CM | POA: Diagnosis not present

## 2023-09-16 DIAGNOSIS — J3081 Allergic rhinitis due to animal (cat) (dog) hair and dander: Secondary | ICD-10-CM | POA: Diagnosis not present

## 2023-09-16 MED ORDER — APIXABAN 5 MG PO TABS: 5.0000 mg | ORAL_TABLET | Freq: Two times a day (BID) | ORAL | 11 refills | Status: DC

## 2023-09-19 ENCOUNTER — Encounter (HOSPITAL_BASED_OUTPATIENT_CLINIC_OR_DEPARTMENT_OTHER): Payer: Self-pay

## 2023-09-23 ENCOUNTER — Ambulatory Visit (HOSPITAL_COMMUNITY): Payer: Medicare Other | Attending: Cardiology

## 2023-09-23 DIAGNOSIS — I340 Nonrheumatic mitral (valve) insufficiency: Secondary | ICD-10-CM | POA: Diagnosis not present

## 2023-09-23 LAB — ECHOCARDIOGRAM COMPLETE
Area-P 1/2: 3.32 cm2
S' Lateral: 3.7 cm

## 2023-09-26 DIAGNOSIS — J3081 Allergic rhinitis due to animal (cat) (dog) hair and dander: Secondary | ICD-10-CM | POA: Diagnosis not present

## 2023-09-26 DIAGNOSIS — M0579 Rheumatoid arthritis with rheumatoid factor of multiple sites without organ or systems involvement: Secondary | ICD-10-CM | POA: Diagnosis not present

## 2023-09-26 DIAGNOSIS — J301 Allergic rhinitis due to pollen: Secondary | ICD-10-CM | POA: Diagnosis not present

## 2023-09-26 DIAGNOSIS — J3089 Other allergic rhinitis: Secondary | ICD-10-CM | POA: Diagnosis not present

## 2023-10-07 DIAGNOSIS — J3089 Other allergic rhinitis: Secondary | ICD-10-CM | POA: Diagnosis not present

## 2023-10-07 DIAGNOSIS — J3081 Allergic rhinitis due to animal (cat) (dog) hair and dander: Secondary | ICD-10-CM | POA: Diagnosis not present

## 2023-10-07 DIAGNOSIS — J301 Allergic rhinitis due to pollen: Secondary | ICD-10-CM | POA: Diagnosis not present

## 2023-10-14 ENCOUNTER — Other Ambulatory Visit: Payer: Self-pay

## 2023-10-14 ENCOUNTER — Ambulatory Visit (INDEPENDENT_AMBULATORY_CARE_PROVIDER_SITE_OTHER): Payer: Medicare Other | Admitting: Physician Assistant

## 2023-10-14 ENCOUNTER — Encounter: Payer: Self-pay | Admitting: Physician Assistant

## 2023-10-14 VITALS — BP 112/74 | HR 73 | Ht 70.0 in | Wt 212.4 lb

## 2023-10-14 DIAGNOSIS — R195 Other fecal abnormalities: Secondary | ICD-10-CM

## 2023-10-14 DIAGNOSIS — J3081 Allergic rhinitis due to animal (cat) (dog) hair and dander: Secondary | ICD-10-CM | POA: Diagnosis not present

## 2023-10-14 DIAGNOSIS — K649 Unspecified hemorrhoids: Secondary | ICD-10-CM | POA: Diagnosis not present

## 2023-10-14 DIAGNOSIS — J3089 Other allergic rhinitis: Secondary | ICD-10-CM | POA: Diagnosis not present

## 2023-10-14 DIAGNOSIS — J301 Allergic rhinitis due to pollen: Secondary | ICD-10-CM | POA: Diagnosis not present

## 2023-10-14 MED ORDER — SUFLAVE 178.7 G PO SOLR
1.0000 | Freq: Once | ORAL | 0 refills | Status: AC
Start: 2023-10-14 — End: 2023-10-14

## 2023-10-14 NOTE — Progress Notes (Signed)
10/14/2023 BRIDGER PIZZI [id number] 1951-07-09  Referring provider: Garlan Fillers, MD Primary GI doctor: Dr. Leone Payor  ASSESSMENT AND PLAN:   FOBT+ stool, no overt GI bleeding Recall colonoscopy 09/2024 No changes in bowel habits, no UGI symptoms Will schedule for colonoscopy for evaluation, most likely this is related to his hemorrhoids, declined rectal today Will repeat CBC, iron/ferritin to evaluate for anemia, I can not see any recent labs and patient states last labs were Sept, if true IDA can consider EGD with colon We have discussed the risks of bleeding, infection, perforation, medication reactions, and remote risk of death associated with colonoscopy. All questions were answered and the patient acknowledges these risk and wishes to proceed.  Hemorrhoids -Sitz baths, increase fiber, increase water -declined rectal today -We discussed hemorrhoid banding here in the office for internal hemorrhoids if not improving with conservative treatment.  Afib on eliquis Hold Eliquis for 2 days before procedure will instruct when and how to resume after procedure. Dr. Scharlene Gloss Patient understands that there is a low but real risk of cardiovascular event such as heart attack, stroke, or embolism /  thrombosis, or ischemia while off Eliquis  The patient consents to proceed.  Will communicate by phone or EMR with patient's prescribing provider to confirm that holding Eliquis is reasonable in this case.    Patient Care Team: Garlan Fillers, MD as PCP - General (Internal Medicine) Corky Crafts, MD as PCP - Cardiology (Cardiology)  HISTORY OF PRESENT ILLNESS: 73 y.o. male with a past medical history of hypertension, hyperlipidemia, persistent atrial fibrillation on Eliquis 5 mg twice daily, (09/23/2023 echocardiogram EF 55 to 60% grade 1 diastolic no aortic stenosis) AAA, RA on Remicade, LPR, hyperparathyroidism, personal history of colon polyps and others listed below presents  for evaluation of FOBT positive.   Last colonoscopy 2021 and completely unremarkable however prior colonoscopies had multiple adenomatous polyps.   Recall 09/2024. Patient was referred in October for FOBT positive stools there were no labs to accompany this.  Discussed the use of AI scribe software for clinical note transcription with the patient, who gave verbal consent to proceed.  History of Present Illness             He  reports that he quit smoking about 15 years ago. His smoking use included cigarettes. He has never used smokeless tobacco. He reports current alcohol use of about 2.0 - 3.0 standard drinks of alcohol per week. He reports that he does not use drugs.  RELEVANT GI HISTORY, LABS, IMAGING: 09/17/2019 colonoscopy recall 09/16/2024 - The entire examined colon is normal on direct and retroflexion views. - No specimens collected. - 2009 - 15 removed - max 8 mm mix of adenomas and ssp' s - 2011 - 3 polyps max 12 mm - right sided hyperplastic ( serrated) 9/ 4/ 2015 - 2 diminutive polyps, cecum and sigmoid - 2 hyperplastic CBC    Component Value Date/Time   WBC 8.9 05/09/2022 0919   RBC 4.62 05/09/2022 0919   HGB 14.1 05/09/2022 0919   HCT 43.2 05/09/2022 0919   PLT 248 05/09/2022 0919   MCV 93.5 05/09/2022 0919   MCH 30.5 05/09/2022 0919   MCHC 32.6 05/09/2022 0919   RDW 12.9 05/09/2022 0919   No results for input(s): "HGB" in the last 8760 hours.  CMP     Component Value Date/Time   NA 135 03/01/2021 0854   NA 139 02/13/2021 0000   K 4.2 03/01/2021 0854  CL 102 03/01/2021 0854   CO2 26 03/01/2021 0854   GLUCOSE 104 (H) 03/01/2021 0854   BUN 17 03/01/2021 0854   BUN 26 02/13/2021 0000   CREATININE 0.88 03/01/2021 0854   CALCIUM 9.9 03/01/2021 0854   PROT 7.0 04/25/2022 0733   ALBUMIN 4.4 04/25/2022 0733   AST 40 04/25/2022 0733   ALT 39 04/25/2022 0733   ALKPHOS 82 04/25/2022 0733   BILITOT 0.3 04/25/2022 0733   GFRNONAA >60 03/01/2021 0854   GFRAA >60  08/26/2015 0953      Latest Ref Rng & Units 04/25/2022    7:33 AM 11/08/2021    8:37 AM 08/26/2015    9:53 AM  Hepatic Function  Total Protein 6.0 - 8.5 g/dL 7.0  7.0  7.3   Albumin 3.9 - 4.9 g/dL 4.4  4.3  3.8   AST 0 - 40 IU/L 40  29  20   ALT 0 - 44 IU/L 39  24  19   Alk Phosphatase 44 - 121 IU/L 82  78  63   Total Bilirubin 0.0 - 1.2 mg/dL 0.3  0.5  0.6   Bilirubin, Direct 0.00 - 0.40 mg/dL 1.61  0.96        Current Medications:    Current Outpatient Medications (Cardiovascular):    EPINEPHrine 0.3 mg/0.3 mL IJ SOAJ injection, Inject 0.3 mg into the muscle as needed for anaphylaxis.   ezetimibe (ZETIA) 10 MG tablet, TAKE 1 TABLET BY MOUTH EVERY DAY   metoprolol succinate (TOPROL-XL) 25 MG 24 hr tablet, Take 25 mg by mouth in the morning.   rosuvastatin (CRESTOR) 20 MG tablet, Take 1 tablet (20 mg total) by mouth daily.  Current Outpatient Medications (Respiratory):    fexofenadine (ALLEGRA) 180 MG tablet, Take 180 mg by mouth daily.   montelukast (SINGULAIR) 10 MG tablet, Take 10 mg by mouth daily.  Current Outpatient Medications (Analgesics):    naproxen sodium (ALEVE) 220 MG tablet, Take 3 tablets by mouth once a week.  Current Outpatient Medications (Hematological):    apixaban (ELIQUIS) 5 MG TABS tablet, Take 1 tablet (5 mg total) by mouth 2 (two) times daily.   Methylcobalamin 5000 MCG TBDP, Take 5,000 mcg by mouth in the morning.  Current Outpatient Medications (Other):    Ascorbic Acid (VITAMIN C) 1000 MG tablet, Take 1,000 mg by mouth in the morning.   Cholecalciferol (VITAMIN D-3) 125 MCG (5000 UT) TABS, Take 5,000 Units by mouth every evening.   co-enzyme Q-10 30 MG capsule, Take 30 mg by mouth daily.   finasteride (PROSCAR) 5 MG tablet, Take 5 mg by mouth daily.   hydroxychloroquine (PLAQUENIL) 200 MG tablet, Take 200 mg by mouth 2 (two) times daily.   inFLIXimab (REMICADE IV), Once every 2 months   Magnesium Citrate 100 MG CAPS, Take 1 capsule by mouth every  morning.   Menatetrenone (VITAMIN K2) 100 MCG TABS, Take 100 mcg by mouth every evening.   Multiple Vitamin (MULTI VITAMIN) TABS, 1 tablet Orally Once a day for 30 day(s)   NON FORMULARY, Inject 1 Dose as directed once a week. Allergy shots   Quercetin 500 MG CAPS, Take 2 capsules by mouth daily.   tamsulosin (FLOMAX) 0.4 MG CAPS capsule, Take 0.4 mg by mouth at bedtime.   TOCOPHEROLS-TOCOTRIENOLS PO, Take 1 capsule by mouth every evening.   Ubiquinol 100 MG CAPS, Take 100 mg by mouth in the morning.   Zinc 50 MG TABS, Take 50 mg by mouth every evening.  Medical History:  Past Medical History:  Diagnosis Date   A-fib [facility])    AAA (abdominal aortic aneurysm) (HCC)    Allergy    hayfever   Arrhythmia    Colon polyps    Coronary artery disease    Hay fever    Hyperlipidemia    Hypertension    Nocturia    Personal history of colonic adenomas 12/22/2012   Rheumatoid arthritis (HCC)    Sleep apnea    Dental Device   Wears glasses    Allergies:  Allergies  Allergen Reactions   Prednisone     Other reaction(s): makes hyper/anxious   Ciprofloxacin Rash and Other (See Comments)    pain   Sulfa Antibiotics Rash    pain     Surgical History:  He  has a past surgical history that includes Colonoscopy (multiple); Inguinal hernia repair; Tonsillectomy; Parathyroidectomy (N/A, 09/02/2015); Cardioversion (N/A, 02/20/2021); and Cardioversion (N/A, 03/10/2021). Family History:  His family history includes Lung cancer in his brother and mother.  REVIEW OF SYSTEMS  : All other systems reviewed and negative except where noted in the History of Present Illness.  PHYSICAL EXAM: BP 112/74   Pulse 73   Ht 5\' 10"  (1.778 m)   Wt 212 lb 6 oz (96.3 kg)   BMI 30.47 kg/m  General Appearance: Well nourished, in no apparent distress. Head:   Normocephalic and atraumatic. Eyes:  sclerae anicteric,conjunctive pink  Respiratory: Respiratory effort normal, BS equal bilaterally without rales,  rhonchi, wheezing. Cardio: RRR with no MRGs. Peripheral pulses intact.  Abdomen: Soft,  Non-distended ,active bowel sounds. No tenderness . No masses. Rectal: declines Musculoskeletal: Full ROM, Normal gait. Without edema. Skin:  Dry and intact without significant lesions or rashes Neuro: Alert and  oriented x4;  No focal deficits. Psych:  Cooperative. Normal mood and affect.    Doree Albee, PA-C 10:52 AM

## 2023-10-14 NOTE — Patient Instructions (Addendum)
___________________________________________________________________________   Please do sitz baths- these can be found at the pharmacy. It is a Chief Operating Officer that is put in your toliet.  Please increase fiber or add benefiber, increase water and increase acitivity.  Will send in hydrocoritsone suppository, cheapest with GOODRX from [employer], [employer], [employer] or [employer] if your insurance does not pay for it. If the hemorrhoid suppository sent in is too expensive you can do this over the counter trick.  Apply a pea size amount of generic prescription Anusol HC cream that has been sent into your pharmacy to the tip of an over the counter PrepH suppository and insert rectally once every night for at least 7 nights.  If this does not improve there are procedures that can be done.   About Hemorrhoids  Hemorrhoids are swollen veins in the lower rectum and anus.  Also called piles, hemorrhoids are a common problem.  Hemorrhoids may be internal (inside the rectum) or external (around the anus).  Internal Hemorrhoids  Internal hemorrhoids are often painless, but they rarely cause bleeding.  The internal veins may stretch and fall down (prolapse) through the anus to the outside of the body.  The veins may then become irritated and painful.  External Hemorrhoids  External hemorrhoids can be easily seen or felt around the anal opening.  They are under the skin around the anus.  When the swollen veins are scratched or broken by straining, rubbing or wiping they sometimes bleed.  How Hemorrhoids Occur  Veins in the rectum and around the anus tend to swell under pressure.  Hemorrhoids can result from increased pressure in the veins of your anus or rectum.  Some sources of pressure are:  Straining to have a bowel movement because of constipation Waiting too long to have a bowel movement Coughing and sneezing often Sitting for extended periods of time, including on the  toilet Diarrhea Obesity Trauma or injury to the anus Some liver diseases Stress Family history of hemorrhoids Pregnancy  Pregnant women should try to avoid becoming constipated, because they are more likely to have hemorrhoids during pregnancy.  In the last trimester of pregnancy, the enlarged uterus may press on blood vessels and causes hemorrhoids.  In addition, the strain of childbirth sometimes causes hemorrhoids after the birth.  Symptoms of Hemorrhoids  Some symptoms of hemorrhoids include: Swelling and/or a tender lump around the anus Itching, mild burning and bleeding around the anus Painful bowel movements with or without constipation Bright red blood covering the stool, on toilet paper or in the toilet bowel.   Symptoms usually go away within a few days.  Always talk to your doctor about any bleeding to make sure it is not from some other causes.  Diagnosing and Treating Hemorrhoids  Diagnosis is made by an examination by your healthcare provider.  Special test can be performed by your doctor.    Most cases of hemorrhoids can be treated with: High-fiber diet: Eat more high-fiber foods, which help prevent constipation.  Ask for more detailed fiber information on types and sources of fiber from your healthcare provider. Fluids: Drink plenty of water.  This helps soften bowel movements so they are easier to pass. Sitz baths and cold packs: Sitting in lukewarm water two or three times a day for 15 minutes cleases the anal area and may relieve discomfort.  If the water is too hot, swelling around the anus will get worse.  Placing a cloth-covered ice pack on the anus for ten minutes four  times a day can also help reduce selling.  Gently pushing a prolapsed hemorrhoid back inside after the bath or ice pack can be helpful. Medications: For mild discomfort, your healthcare provider may suggest over-the-counter pain medication or prescribe a cream or ointment for topical use.  The cream  may contain witch hazel, zinc oxide or petroleum jelly.  Medicated suppositories are also a treatment option.  Always consult your doctor before applying medications or creams. Procedures and surgeries: There are also a number of procedures and surgeries to shrink or remove hemorrhoids in more serious cases.  Talk to your physician about these options.  You can often prevent hemorrhoids or keep them from becoming worse by maintaining a healthy lifestyle.  Eat a fiber-rich diet of fruits, vegetables and whole grains.  Also, drink plenty of water and exercise regularly.   2007, [employer] Doc.30

## 2023-10-22 ENCOUNTER — Other Ambulatory Visit: Payer: Self-pay | Admitting: Interventional Cardiology

## 2023-10-25 DIAGNOSIS — J3081 Allergic rhinitis due to animal (cat) (dog) hair and dander: Secondary | ICD-10-CM | POA: Diagnosis not present

## 2023-10-25 DIAGNOSIS — J301 Allergic rhinitis due to pollen: Secondary | ICD-10-CM | POA: Diagnosis not present

## 2023-10-25 DIAGNOSIS — J3089 Other allergic rhinitis: Secondary | ICD-10-CM | POA: Diagnosis not present

## 2023-10-31 DIAGNOSIS — D225 Melanocytic nevi of trunk: Secondary | ICD-10-CM | POA: Diagnosis not present

## 2023-10-31 DIAGNOSIS — L814 Other melanin hyperpigmentation: Secondary | ICD-10-CM | POA: Diagnosis not present

## 2023-10-31 DIAGNOSIS — Z08 Encounter for follow-up examination after completed treatment for malignant neoplasm: Secondary | ICD-10-CM | POA: Diagnosis not present

## 2023-10-31 DIAGNOSIS — L821 Other seborrheic keratosis: Secondary | ICD-10-CM | POA: Diagnosis not present

## 2023-11-01 DIAGNOSIS — J3089 Other allergic rhinitis: Secondary | ICD-10-CM | POA: Diagnosis not present

## 2023-11-01 DIAGNOSIS — J3081 Allergic rhinitis due to animal (cat) (dog) hair and dander: Secondary | ICD-10-CM | POA: Diagnosis not present

## 2023-11-01 DIAGNOSIS — J301 Allergic rhinitis due to pollen: Secondary | ICD-10-CM | POA: Diagnosis not present

## 2023-11-15 ENCOUNTER — Encounter: Payer: Self-pay | Admitting: Cardiovascular Disease

## 2023-11-15 MED ORDER — ROSUVASTATIN CALCIUM 20 MG PO TABS: 20.0000 mg | ORAL_TABLET | Freq: Every day | ORAL | 2 refills | Status: DC

## 2023-11-21 DIAGNOSIS — Z79899 Other long term (current) drug therapy: Secondary | ICD-10-CM | POA: Diagnosis not present

## 2023-11-21 DIAGNOSIS — M0579 Rheumatoid arthritis with rheumatoid factor of multiple sites without organ or systems involvement: Secondary | ICD-10-CM | POA: Diagnosis not present

## 2023-11-22 DIAGNOSIS — J301 Allergic rhinitis due to pollen: Secondary | ICD-10-CM | POA: Diagnosis not present

## 2023-11-22 DIAGNOSIS — J3081 Allergic rhinitis due to animal (cat) (dog) hair and dander: Secondary | ICD-10-CM | POA: Diagnosis not present

## 2023-11-22 DIAGNOSIS — J3089 Other allergic rhinitis: Secondary | ICD-10-CM | POA: Diagnosis not present

## 2023-11-22 IMAGING — CT CT CARDIAC CORONARY ARTERY CALCIUM SCORE
3 series · 14 of 20 positions shown, 16 images · non-contrast
Comparison: None.
COMPARISON: None.

Addendum:
EXAM:
OVER-READ INTERPRETATION  CT CHEST

The following report is an over-read performed by radiologist Dr.
Vytaras Traill [REDACTED] on 08/18/2021. This
over-read does not include interpretation of cardiac or coronary
anatomy or pathology. The coronary calcium score interpretation by
the cardiologist is attached.
CLINICAL DATA: Cardiovascular Disease Risk stratification
Coronary Calcium Score
TECHNIQUE: A gated, non-contrast computed tomography scan of the heart was
performed using 3mm slice thickness. Axial images were analyzed on a
dedicated workstation. Calcium scoring of the coronary arteries was
performed using the Agatston method.

[Series 2: cascseq 2.0 sa36 70% (id) · axial · 0.43mm/px · z∈[-276,-174]mm · 4 of 85 slices shown]
[im 17/85  vessel]
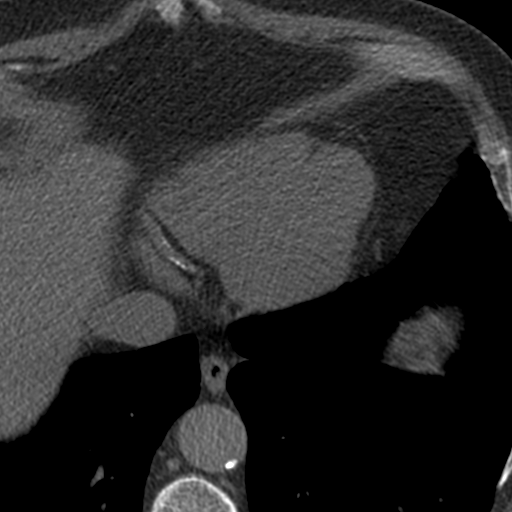
[im 34/85  vessel]
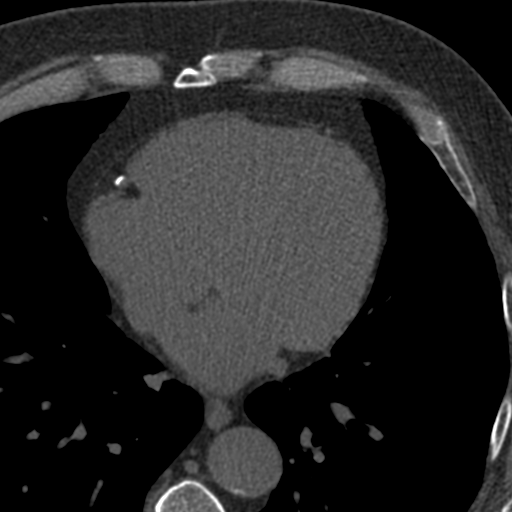
[im 51/85  vessel]
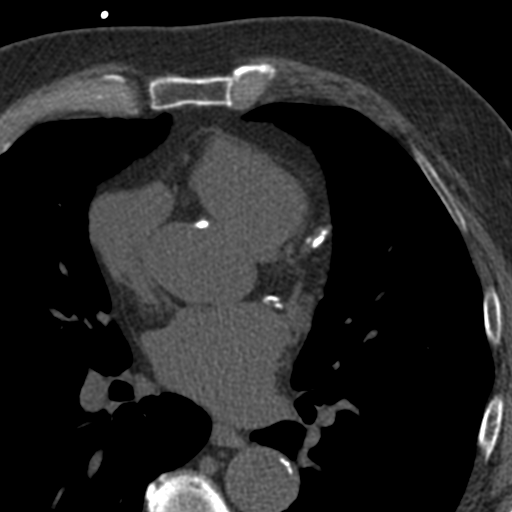
[im 68/85  vessel]
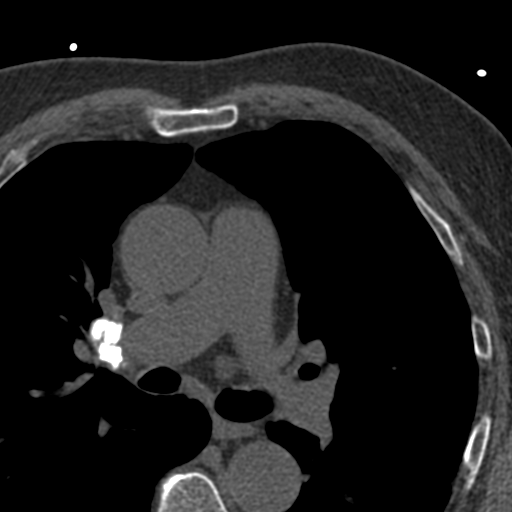

[Series 3: cascseq 2.0 bf37 st · axial · 0.76mm/px · z∈[-280,-168]mm · 5 of 85 slices shown, 7 images]
[im 15/85  vessel]
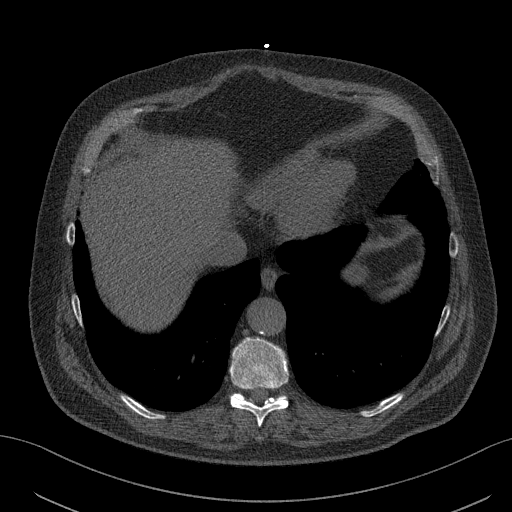
[im 15/85  lung]
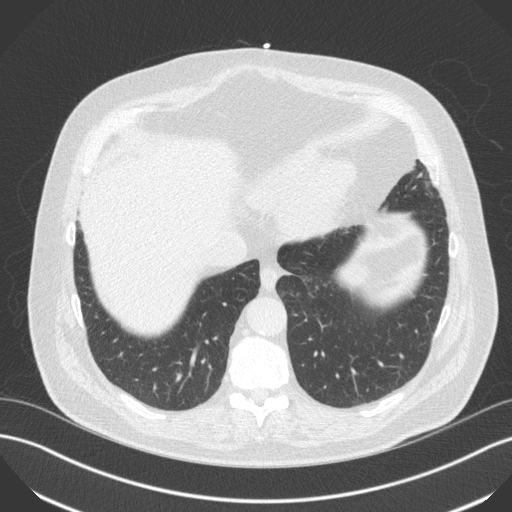
[im 29/85  vessel]
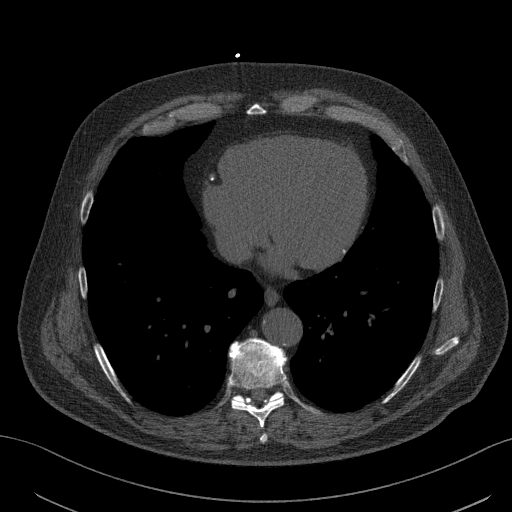
[im 43/85  vessel]
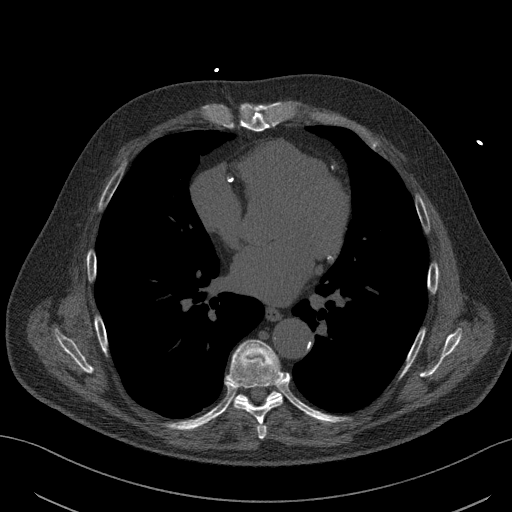
[im 57/85  vessel]
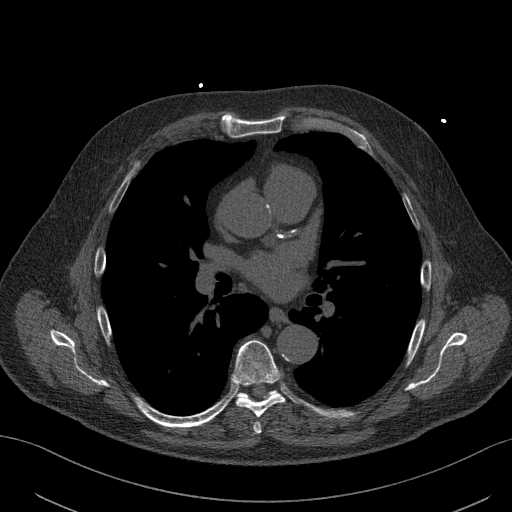
[im 71/85  vessel]
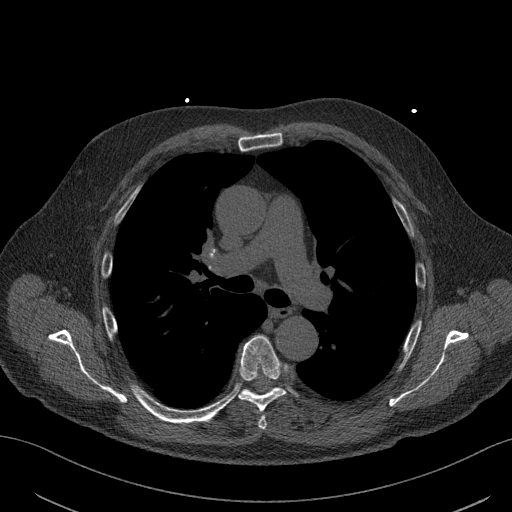
[im 71/85  lung]
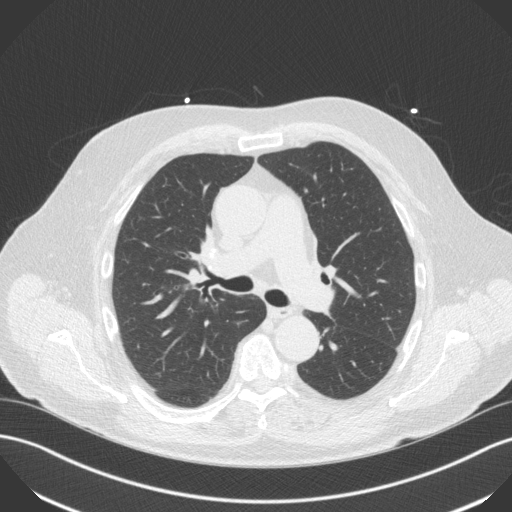

[Series 4: cascseq 2.0 br59 lung · axial · 0.76mm/px · z∈[-280,-168]mm · 5 of 85 slices shown]
[im 15/85  lung]
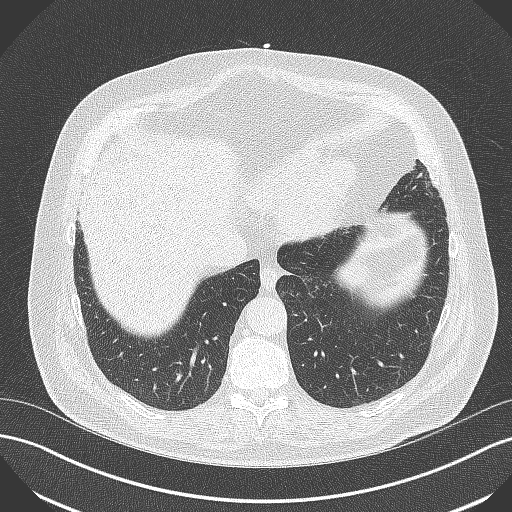
[im 29/85  lung]
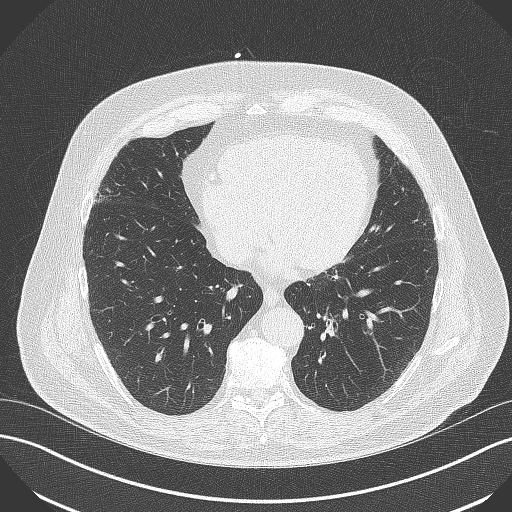
[im 43/85  lung]
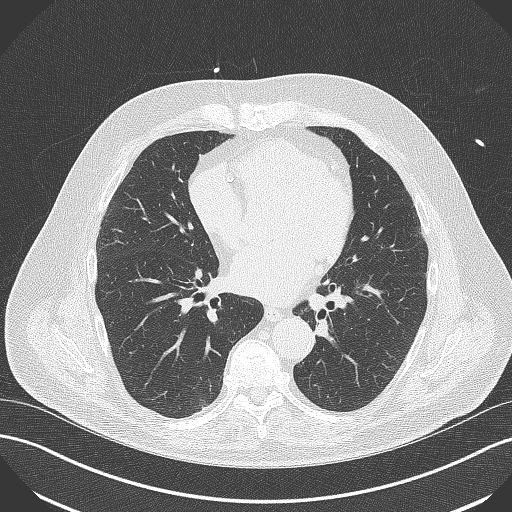
[im 57/85  lung]
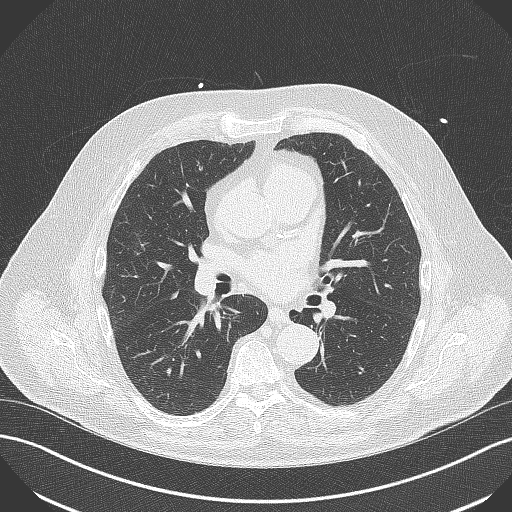
[im 71/85  lung]
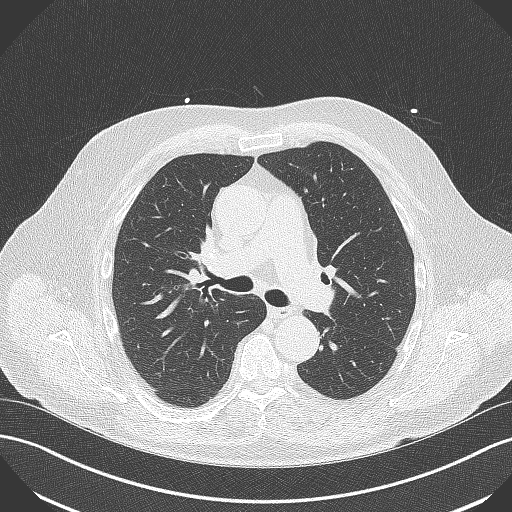

[14 of 20 positions shown; findings below may reference images not displayed]

FINDINGS: Atherosclerotic calcifications in the thoracic aorta. Mild fibrotic
changes in the lung bases. Within the visualized portions of the
thorax there are no suspicious appearing pulmonary nodules or
masses, there is no acute consolidative airspace disease, no pleural
effusions, no pneumothorax and no lymphadenopathy. Visualized
portions of the upper abdomen are unremarkable. There are no
aggressive appearing lytic or blastic lesions noted in the
visualized portions of the skeleton.
IMPRESSION: 1.  Aortic Atherosclerosis (9CIAV-VUM.M).
2. The appearance of the lung bases may suggest developing
interstitial lung disease. Further evaluation with nonemergent
high-resolution chest CT is suggested in the near future. Outpatient
referral to Pulmonology is also recommended for further clinical
evaluation.
FINDINGS: Coronary Calcium Score:

Left main:

Left anterior descending artery: 805

Left circumflex artery: 486

Right coronary artery: 801

Total: 5464

Percentile: 95

Pericardium: Normal.

Ascending Aorta: Normal caliber; aortic atherosclerosis noted.

Non-cardiac: See separate report from [REDACTED].
IMPRESSION: Coronary calcium score of 5464. This was 95 percentile for age-,
race-, and sex-matched controls.

Aortic atherosclerosis noted.



If CAC=0, it is reasonable to withhold statin therapy and reassess
in 5 to 10 years, as long as higher risk conditions are absent
(diabetes mellitus, family history of premature CHD in first degree
relatives (males <55 years; females <65 years), cigarette smoking,
or LDL >=190 mg/dL).

If CAC is 1 to 99, it is reasonable to initiate statin therapy for
patients >=55 years of age.

If CAC is >=100 or >=75th percentile, it is reasonable to initiate
statin therapy at any age.

Cardiology referral should be considered for patients with CAC
scores >=400 or >=75th percentile.

*5741 AHA/ACC/AACVPR/AAPA/ABC/MKHATSHWA/ALI JAN/RINNAH/Shanita/SEIBOKIENE/BEDOLLA/NOMASIBULELE
Guideline on the Management of Blood Cholesterol: A Report of the
American College of Cardiology/American Heart Association Task Force
on Clinical Practice Guidelines. J Am Coll Cardiol.
5060;73(24):9926-9857.

*** End of Addendum ***
EXAM:
OVER-READ INTERPRETATION  CT CHEST

The following report is an over-read performed by radiologist Dr.
Vytaras Traill [REDACTED] on 08/18/2021. This
over-read does not include interpretation of cardiac or coronary
anatomy or pathology. The coronary calcium score interpretation by
the cardiologist is attached.
FINDINGS: Atherosclerotic calcifications in the thoracic aorta. Mild fibrotic
changes in the lung bases. Within the visualized portions of the
thorax there are no suspicious appearing pulmonary nodules or
masses, there is no acute consolidative airspace disease, no pleural
effusions, no pneumothorax and no lymphadenopathy. Visualized
portions of the upper abdomen are unremarkable. There are no
aggressive appearing lytic or blastic lesions noted in the
visualized portions of the skeleton.
IMPRESSION: 1.  Aortic Atherosclerosis (9CIAV-VUM.M).
2. The appearance of the lung bases may suggest developing
interstitial lung disease. Further evaluation with nonemergent
high-resolution chest CT is suggested in the near future. Outpatient
referral to Pulmonology is also recommended for further clinical
evaluation.

## 2023-11-25 ENCOUNTER — Telehealth: Payer: Self-pay | Admitting: Physician Assistant

## 2023-11-25 ENCOUNTER — Telehealth: Payer: Self-pay

## 2023-11-25 NOTE — Telephone Encounter (Signed)
     Primary Cardiologist: Lance Muss, MD  Clinical pharmacist have reviewed patient's chart as part of pre-operative protocol coverage.  The following recommendations have been made for , Frederick Bridges   Patient with diagnosis of afib on Eliquis for anticoagulation.     Procedure: colonoscopy Date of procedure: 11/27/23     CHA2DS2-VASc Score = 2   This indicates a 2.2% annual risk of stroke. The patient's score is based upon: CHF History: 0 HTN History: 0 Diabetes History: 0 Stroke History: 0 Vascular Disease History: 1 Age Score: 1 Gender Score: 0       CrCl 86 ml/min Platelet count 255   Per office protocol, patient can hold Eliquis for 2 days prior to procedure.  I will route this recommendation to the requesting party via Epic fax function and remove from pre-op pool.  Please call with questions.  Thomasene Ripple. Enrica Corliss NP-C     11/25/2023, 4:50 PM [facility] [address] Office [phone] Fax [phone]\

## 2023-11-25 NOTE — Telephone Encounter (Signed)
 Inbound call from patient, states he would like to speak to a nurse about his eliquis hold and other medications he is unsure if he has to hold or not.

## 2023-11-25 NOTE — Telephone Encounter (Signed)
 Patient with diagnosis of afib on Eliquis for anticoagulation.    Procedure: colonoscopy Date of procedure: 11/27/23   CHA2DS2-VASc Score = 2   This indicates a 2.2% annual risk of stroke. The patient's score is based upon: CHF History: 0 HTN History: 0 Diabetes History: 0 Stroke History: 0 Vascular Disease History: 1 Age Score: 1 Gender Score: 0      CrCl 86 ml/min Platelet count 255  Per office protocol, patient can hold Eliquis for 2 days prior to procedure.    **This guidance is not considered finalized until pre-operative APP has relayed final recommendations.**

## 2023-11-25 NOTE — Telephone Encounter (Signed)
 URGENT     Request for surgical clearance:     Endoscopy Procedure  What type of surgery is being performed?     Colonoscopy   When is this surgery scheduled?     11/27/2023  What type of clearance is required ?   Pharmacy  Are there any medications that need to be held prior to surgery and how long? Eliquis   Practice name and name of physician performing surgery?      [facility]  What is your office phone and fax number?      Phone- [phone]  Fax- [phone]  Anesthesia type (None, local, MAC, general) ?       MAC   Please route your response to Frederick Bridges

## 2023-11-26 NOTE — Progress Notes (Unsigned)
 [facility] History and Physical   Primary Care Physician:  Garlan Fillers, MD   Reason for Procedure:   Heme + stool  Plan:    colonoscopy     HPI: Frederick Bridges is a 73 y.o. male w/ heme + stool last Fall - Hgb was normal and so was MCV. Some hx suggests bleeding hemorrhoids. Has hx colon polyps - surveillance exam was recommeded for 09/2024 but now heme +. Also on Eliquis (held)   Polyp history: 2009 - 15 removed - max 8 mm mix of adenomas and ssp's 2011 - 3 polyps max 12 mm - right sided hyperplastic (serrated) 05/14/2014 - 2 diminutive polyps, cecum and sigmoid - 2 hyperplastic 09/17/2019 no polyps - recall 2026   Past Medical History:  Diagnosis Date   A-fib [facility])    AAA (abdominal aortic aneurysm) (HCC)    Allergy    hayfever   Arrhythmia    Colon polyps    Coronary artery disease    Hay fever    Hyperlipidemia    Hypertension    Nocturia    Personal history of colonic adenomas 12/22/2012   Rheumatoid arthritis (HCC)    Sleep apnea    Dental Device   Wears glasses     Past Surgical History:  Procedure Laterality Date   CARDIOVERSION N/A 02/20/2021   Procedure: CARDIOVERSION;  Surgeon: Quintella Reichert, MD;  Location: [facility] ENDOSCOPY;  Service: Cardiovascular;  Laterality: N/A;   CARDIOVERSION N/A 03/10/2021   Procedure: CARDIOVERSION;  Surgeon: Sande Rives, MD;  Location: [facility] ENDOSCOPY;  Service: Cardiovascular;  Laterality: N/A;   COLONOSCOPY  multiple   INGUINAL HERNIA REPAIR     PARATHYROIDECTOMY N/A 09/02/2015   Procedure: PARATHYROIDECTOMY;  Surgeon: Darnell Level, MD;  Location: [facility] OR;  Service: General;  Laterality: N/A;   TONSILLECTOMY     as a child    Prior to Admission medications   Medication Sig Start Date End Date Taking? Authorizing Provider  apixaban (ELIQUIS) 5 MG TABS tablet Take 1 tablet (5 mg total) by mouth 2 (two) times daily. 09/16/23   O'NealRonnald Ramp, MD  Ascorbic Acid (VITAMIN C) 1000 MG tablet Take 1,000  mg by mouth in the morning.    [provider]  Cholecalciferol (VITAMIN D-3) 125 MCG (5000 UT) TABS Take 5,000 Units by mouth every evening.    [provider]  co-enzyme Q-10 30 MG capsule Take 30 mg by mouth daily.    [provider]  EPINEPHrine 0.3 mg/0.3 mL IJ SOAJ injection Inject 0.3 mg into the muscle as needed for anaphylaxis.    [provider]  ezetimibe (ZETIA) 10 MG tablet TAKE 1 TABLET BY MOUTH EVERY DAY 10/22/23   O'Neal, Ronnald Ramp, MD  fexofenadine (ALLEGRA) 180 MG tablet Take 180 mg by mouth daily.    [provider]  finasteride (PROSCAR) 5 MG tablet Take 5 mg by mouth daily. 01/02/22   [provider]  hydroxychloroquine (PLAQUENIL) 200 MG tablet Take 200 mg by mouth 2 (two) times daily. 03/02/21   [provider]  inFLIXimab (REMICADE IV) Once every 2 months    [provider]  Magnesium Citrate 100 MG CAPS Take 1 capsule by mouth every morning. 02/01/20   [provider]  Menatetrenone (VITAMIN K2) 100 MCG TABS Take 100 mcg by mouth every evening.    [provider]  Methylcobalamin 5000 MCG TBDP Take 5,000 mcg by mouth in the morning.    [provider]  metoprolol succinate (TOPROL-XL) 25 MG 24 hr tablet Take 25 mg by mouth in the morning. 01/16/21   [provider]  montelukast (SINGULAIR) 10 MG tablet Take 10 mg by mouth daily. 10/26/21   [provider]  Multiple Vitamin (MULTI VITAMIN) TABS 1 tablet Orally Once a day for 30 day(s)    [provider]  naproxen sodium (ALEVE) 220 MG tablet Take 3 tablets by mouth once a week. 05/29/18   [provider]  NON FORMULARY Inject 1 Dose as directed once a week. Allergy shots    [provider]  Quercetin 500 MG CAPS Take 2 capsules by mouth daily.    [provider]  rosuvastatin (CRESTOR) 20 MG tablet Take 1 tablet (20 mg total) by mouth daily. 11/15/23   O'NealRonnald Ramp, MD   tamsulosin (FLOMAX) 0.4 MG CAPS capsule Take 0.4 mg by mouth at bedtime. 01/02/21   [provider]  TOCOPHEROLS-TOCOTRIENOLS PO Take 1 capsule by mouth every evening.    [provider]  Ubiquinol 100 MG CAPS Take 100 mg by mouth in the morning.    [provider]  Zinc 50 MG TABS Take 50 mg by mouth every evening.    [provider]    Current Outpatient Medications  Medication Sig Dispense Refill   apixaban (ELIQUIS) 5 MG TABS tablet Take 1 tablet (5 mg total) by mouth 2 (two) times daily. 60 tablet 11   Ascorbic Acid (VITAMIN C) 1000 MG tablet Take 1,000 mg by mouth in the morning.     Cholecalciferol (VITAMIN D-3) 125 MCG (5000 UT) TABS Take 5,000 Units by mouth every evening.     co-enzyme Q-10 30 MG capsule Take 30 mg by mouth daily.     fexofenadine (ALLEGRA) 180 MG tablet Take 180 mg by mouth daily.     inFLIXimab (REMICADE IV) Once every 2 months     naproxen sodium (ALEVE) 220 MG tablet Take 3 tablets by mouth once a week.     rosuvastatin (CRESTOR) 20 MG tablet Take 1 tablet (20 mg total) by mouth daily. 90 tablet 2   tamsulosin (FLOMAX) 0.4 MG CAPS capsule Take 0.4 mg by mouth at bedtime.     Azelastine HCl 0.15 % SOLN 2 sprays in each nostril Nasally Once a day PM for 30 day(s)     EPINEPHrine 0.3 mg/0.3 mL IJ SOAJ injection Inject 0.3 mg into the muscle as needed for anaphylaxis.     ezetimibe (ZETIA) 10 MG tablet TAKE 1 TABLET BY MOUTH EVERY DAY 90 tablet 3   finasteride (PROSCAR) 5 MG tablet Take 5 mg by mouth daily.     hydroxychloroquine (PLAQUENIL) 200 MG tablet Take 200 mg by mouth 2 (two) times daily.     Magnesium Citrate 100 MG CAPS Take 1 capsule by mouth every morning.     Menatetrenone (VITAMIN K2) 100 MCG TABS Take 100 mcg by mouth every evening.     Methylcobalamin 5000 MCG TBDP Take 5,000 mcg by mouth in the morning.     metoprolol succinate (TOPROL-XL) 25 MG 24 hr tablet Take 25 mg by mouth in the morning.     montelukast  (SINGULAIR) 10 MG tablet Take 10 mg by mouth daily.     Multiple Vitamin (MULTI VITAMIN) TABS 1 tablet Orally Once a day for 30 day(s)     NON FORMULARY Inject 1 Dose as directed once a week. Allergy shots     Quercetin 500  MG CAPS Take 2 capsules by mouth daily.     TOCOPHEROLS-TOCOTRIENOLS PO Take 1 capsule by mouth every evening.     Ubiquinol 100 MG CAPS Take 100 mg by mouth in the morning.     Zinc 50 MG TABS Take 50 mg by mouth every evening.     Current Facility-Administered Medications  Medication Dose Route Frequency Provider Last Rate Last Admin   0.9 %  sodium chloride infusion  500 mL Intravenous Once Iva Boop, MD        Allergies as of 11/27/2023 - Review Complete 11/27/2023  Allergen Reaction Noted   Atorvastatin Hives 12/31/2017   Ciprofloxacin Rash and Other (See Comments) 01/20/2021   Prednisone Other (See Comments) 01/30/2021   Sulfa antibiotics Rash 01/20/2021    Family History  Problem Relation Age of Onset   Lung cancer Mother    Lung cancer Brother    Colon cancer Neg Hx    Pancreatic cancer Neg Hx    Rectal cancer Neg Hx    Stomach cancer Neg Hx    Colon polyps Neg Hx    Esophageal cancer Neg Hx     Social History   Socioeconomic History   Marital status: Married    Spouse name: Not on file   Number of children: 2   Years of education: Not on file   Highest education level: Not on file  Occupational History   Occupation: retired    Associate Professor: [employer]  Tobacco Use   Smoking status: Former    Current packs/day: 0.00    Types: Cigarettes    Quit date: 07/10/2008    Years since quitting: 15.3   Smokeless tobacco: Never   Tobacco comments:    Former smoker 05/03/2021  Vaping Use   Vaping status: Never Used  Substance and Sexual Activity   Alcohol use: Yes    Alcohol/week: 2.0 - 3.0 standard drinks of alcohol    Types: 2 - 3 Standard drinks or equivalent per week    Comment: 2-3 beers/wine weekly 05/09/22   Drug use:  No   Sexual activity: Not on file  Other Topics Concern   Not on file  Social History Narrative   Left handed    Social Drivers of Health   Financial Resource Strain: Not on file  Food Insecurity: Not on file  Transportation Needs: Not on file  Physical Activity: Not on file  Stress: Not on file  Social Connections: Not on file  Intimate Partner Violence: Not on file    Review of Systems:  All other review of systems negative except as mentioned in the HPI.  Physical Exam: Vital signs BP 113/76   Pulse 84   Temp 97.9 F (36.6 C)   Ht 5\' 10"  (1.778 m)   Wt 212 lb 9.6 oz (96.4 kg)   SpO2 97%   BMI 30.50 kg/m   General:   Alert,  Well-developed, well-nourished, pleasant and cooperative in NAD Lungs:  Clear throughout to auscultation.   Heart:  Regular rate and rhythm; no murmurs, clicks, rubs,  or gallops. Abdomen:  Soft, nontender and nondistended. Normal bowel sounds.   Neuro/Psych:  Alert and cooperative. Normal mood and affect. A and O x 3   @Dusty Wagoner  Sena Slate, MD, [facility] [phone] (pager) 11/27/2023 11:11 AM@

## 2023-11-27 ENCOUNTER — Encounter: Payer: Self-pay | Admitting: Internal Medicine

## 2023-11-27 ENCOUNTER — Ambulatory Visit: Payer: Medicare Other | Admitting: Internal Medicine

## 2023-11-27 VITALS — BP 111/54 | HR 64 | Temp 97.9°F | Resp 15 | Ht 70.0 in | Wt 212.6 lb

## 2023-11-27 DIAGNOSIS — K5730 Diverticulosis of large intestine without perforation or abscess without bleeding: Secondary | ICD-10-CM | POA: Diagnosis not present

## 2023-11-27 DIAGNOSIS — D125 Benign neoplasm of sigmoid colon: Secondary | ICD-10-CM

## 2023-11-27 DIAGNOSIS — D122 Benign neoplasm of ascending colon: Secondary | ICD-10-CM | POA: Diagnosis not present

## 2023-11-27 DIAGNOSIS — K648 Other hemorrhoids: Secondary | ICD-10-CM

## 2023-11-27 DIAGNOSIS — R195 Other fecal abnormalities: Secondary | ICD-10-CM | POA: Diagnosis not present

## 2023-11-27 DIAGNOSIS — D123 Benign neoplasm of transverse colon: Secondary | ICD-10-CM

## 2023-11-27 DIAGNOSIS — D124 Benign neoplasm of descending colon: Secondary | ICD-10-CM | POA: Diagnosis not present

## 2023-11-27 DIAGNOSIS — Z1211 Encounter for screening for malignant neoplasm of colon: Secondary | ICD-10-CM

## 2023-11-27 MED ORDER — SODIUM CHLORIDE 0.9 % IV SOLN: 500.0000 mL | Freq: Once | INTRAVENOUS | Status: DC

## 2023-11-27 NOTE — Op Note (Signed)
  [facility] Patient Name: Frederick Bridges Procedure Date: 11/27/2023 11:14 AM MRN: [id number] Endoscopist: Iva Boop , MD, [id number] Age: 73 Referring MD:  Date of Birth: 1950/11/01 Gender: Male Account #: [id number] Procedure:                Colonoscopy Indications:              Positive fecal immunochemical test Medicines:                Monitored Anesthesia Care Procedure:                Pre-Anesthesia Assessment:                           - Prior to the procedure, a History and Physical                            was performed, and patient medications and                            allergies were reviewed. The patient's tolerance of                            previous anesthesia was also reviewed. The risks                            and benefits of the procedure and the sedation                            options and risks were discussed with the patient.                            All questions were answered, and informed consent                            was obtained. Prior Anticoagulants: The patient                            last took Eliquis (apixaban) 2 days prior to the                            procedure. ASA Grade Assessment: III - A patient                            with severe systemic disease. After reviewing the                            risks and benefits, the patient was deemed in                            satisfactory condition to undergo the procedure.                           After obtaining informed consent, the colonoscope  was passed under direct vision. Throughout the                            procedure, the patient's blood pressure, pulse, and                            oxygen saturations were monitored continuously. The                            [id number]-HQ190L Colonoscope [id number] was introduced                            through the anus and advanced to the the cecum,                            identified by  appendiceal orifice and ileocecal                            valve. The colonoscopy was performed without                            difficulty. The patient tolerated the procedure                            well. The quality of the bowel preparation was                            good. The ileocecal valve, appendiceal orifice, and                            rectum were photographed. The bowel preparation                            used was SUFLAVE via split dose instruction. Scope In: 11:21:23 AM Scope Out: 11:43:52 AM Scope Withdrawal Time: 0 hours 18 minutes 11 seconds  Total Procedure Duration: 0 hours 22 minutes 29 seconds  Findings:                 The perianal and digital rectal examinations were                            normal.                           12 sessile polyps were found in the sigmoid colon,                            descending colon, transverse colon and ascending                            colon. The polyps were 2 to 12 mm in size. These                            polyps were removed with a cold snare. Resection  and retrieval were complete. Verification of                            patient identification for the specimen was done.                            Estimated blood loss was minimal.                           A few small-mouthed diverticula were found in the                            sigmoid colon.                           Internal hemorrhoids were found.                           The exam was otherwise without abnormality on                            direct and retroflexion views. Estimated Blood Loss:     Estimated blood loss was minimal. Impression:               - 12 2 to 12 mm polyps in the sigmoid colon, in the                            descending colon, in the transverse colon and in                            the ascending colon, removed with a cold snare.                            Resected and retrieved.                            - Diverticulosis in the sigmoid colon.                           - Internal hemorrhoids.                           - The examination was otherwise normal on direct                            and retroflexion views.                           - Personal history of colonic polyps. 2009 - 15                            removed - max 8 mm mix of adenomas and ssp's                           2011 - 3 polyps max 12 mm -  right sided                            hyperplastic (serrated)                           05/14/2014 - 2 diminutive polyps, cecum and sigmoid -                            2 hyperplastic                           09/17/2019 no polyps Recommendation:           - Patient has a contact number available for                            emergencies. The signs and symptoms of potential                            delayed complications were discussed with the                            patient. Return to normal activities tomorrow.                            Written discharge instructions were provided to the                            patient.                           - Resume previous diet.                           - Continue present medications.                           - Repeat colonoscopy is recommended for                            surveillance. The colonoscopy date will be                            determined after pathology results from today's                            exam become available for review.                           - Resume Eliquis (apixaban) at prior dose tomorrow. Iva Boop, MD 11/27/2023 11:58:18 AM This report has been signed electronically.

## 2023-11-27 NOTE — Progress Notes (Signed)
 Vss nad trans to pacu

## 2023-11-27 NOTE — Patient Instructions (Addendum)
 I think the blood in the stool came from hemorrhoids.  You did grow numerous polyps since last exam - I removed 12 small polyps.  I will let you know pathology results and when to have another routine colonoscopy by mail and/or My Chart.  I appreciate the opportunity to care for you. Iva Boop, MD, FACG   YOU HAD AN ENDOSCOPIC PROCEDURE TODAY AT THE [facility]:   Refer to the procedure report that was given to you for any specific questions about what was found during the examination.  If the procedure report does not answer your questions, please call your gastroenterologist to clarify.  If you requested that your care partner not be given the details of your procedure findings, then the procedure report has been included in a sealed envelope for you to review at your convenience later.  YOU SHOULD EXPECT: Some feelings of bloating in the abdomen. Passage of more gas than usual.  Walking can help get rid of the air that was put into your GI tract during the procedure and reduce the bloating. If you had a lower endoscopy (such as a colonoscopy or flexible sigmoidoscopy) you may notice spotting of blood in your stool or on the toilet paper. If you underwent a bowel prep for your procedure, you may not have a normal bowel movement for a few days.  Please Note:  You might notice some irritation and congestion in your nose or some drainage.  This is from the oxygen used during your procedure.  There is no need for concern and it should clear up in a day or so.  SYMPTOMS TO REPORT IMMEDIATELY:  Following lower endoscopy (colonoscopy or flexible sigmoidoscopy):  Excessive amounts of blood in the stool  Significant tenderness or worsening of abdominal pains  Swelling of the abdomen that is new, acute  Fever of 100F or higher   For urgent or emergent issues, a gastroenterologist can be reached at any hour by calling [phone]. Do not use MyChart messaging for urgent  concerns.    DIET:  We do recommend a small meal at first, but then you may proceed to your regular diet.  Drink plenty of fluids but you should avoid alcoholic beverages for 24 hours.  ACTIVITY:  You should plan to take it easy for the rest of today and you should NOT DRIVE or use heavy machinery until tomorrow (because of the sedation medicines used during the test).    FOLLOW UP: Our staff will call the number listed on your records the next business day following your procedure.  We will call around 7:15- 8:00 am to check on you and address any questions or concerns that you may have regarding the information given to you following your procedure. If we do not reach you, we will leave a message.     If any biopsies were taken you will be contacted by phone or by letter within the next 1-3 weeks.  Please call us at [phone] if you have not heard about the biopsies in 3 weeks.    SIGNATURES/CONFIDENTIALITY: You and/or your care partner have signed paperwork which will be entered into your electronic medical record.  These signatures attest to the fact that that the information above on your After Visit Summary has been reviewed and is understood.  Full responsibility of the confidentiality of this discharge information lies with you and/or your care-partner.

## 2023-11-27 NOTE — Progress Notes (Signed)
 Called to room to assist during endoscopic procedure.  Patient ID and intended procedure confirmed with present staff. Received instructions for my participation in the procedure from the performing physician.

## 2023-11-28 ENCOUNTER — Telehealth: Payer: Self-pay

## 2023-11-28 NOTE — Telephone Encounter (Signed)
  Follow up Call-     11/27/2023   10:25 AM  Call back number  Post procedure Call Back phone  # [phone]  Permission to leave phone message Yes     Patient questions:  Do you have a fever, pain , or abdominal swelling? No. Pain Score  0 *  Have you tolerated food without any problems? Yes.    Have you been able to return to your normal activities? Yes.    Do you have any questions about your discharge instructions: Diet   No. Medications  No. Follow up visit  No.  Do you have questions or concerns about your Care? No.  Actions: * If pain score is 4 or above: No action needed, pain <4.

## 2023-11-29 DIAGNOSIS — J3081 Allergic rhinitis due to animal (cat) (dog) hair and dander: Secondary | ICD-10-CM | POA: Diagnosis not present

## 2023-11-29 DIAGNOSIS — J3089 Other allergic rhinitis: Secondary | ICD-10-CM | POA: Diagnosis not present

## 2023-11-29 DIAGNOSIS — J301 Allergic rhinitis due to pollen: Secondary | ICD-10-CM | POA: Diagnosis not present

## 2023-11-29 LAB — SURGICAL PATHOLOGY

## 2023-12-04 DIAGNOSIS — K080 Exfoliation of teeth due to systemic causes: Secondary | ICD-10-CM | POA: Diagnosis not present

## 2023-12-06 ENCOUNTER — Other Ambulatory Visit: Payer: Self-pay | Admitting: Internal Medicine

## 2023-12-06 ENCOUNTER — Encounter: Payer: Self-pay | Admitting: Internal Medicine

## 2023-12-06 DIAGNOSIS — J3081 Allergic rhinitis due to animal (cat) (dog) hair and dander: Secondary | ICD-10-CM | POA: Diagnosis not present

## 2023-12-06 DIAGNOSIS — D369 Benign neoplasm, unspecified site: Secondary | ICD-10-CM

## 2023-12-06 DIAGNOSIS — J301 Allergic rhinitis due to pollen: Secondary | ICD-10-CM | POA: Diagnosis not present

## 2023-12-06 DIAGNOSIS — J3089 Other allergic rhinitis: Secondary | ICD-10-CM | POA: Diagnosis not present

## 2023-12-13 DIAGNOSIS — J3081 Allergic rhinitis due to animal (cat) (dog) hair and dander: Secondary | ICD-10-CM | POA: Diagnosis not present

## 2023-12-13 DIAGNOSIS — J301 Allergic rhinitis due to pollen: Secondary | ICD-10-CM | POA: Diagnosis not present

## 2023-12-13 DIAGNOSIS — J3089 Other allergic rhinitis: Secondary | ICD-10-CM | POA: Diagnosis not present

## 2023-12-19 ENCOUNTER — Other Ambulatory Visit: Payer: Self-pay

## 2023-12-19 ENCOUNTER — Encounter: Payer: Self-pay | Admitting: Genetic Counselor

## 2023-12-19 ENCOUNTER — Inpatient Hospital Stay: Attending: Genetic Counselor | Admitting: Genetic Counselor

## 2023-12-19 ENCOUNTER — Inpatient Hospital Stay

## 2023-12-19 DIAGNOSIS — Z1379 Encounter for other screening for genetic and chromosomal anomalies: Secondary | ICD-10-CM | POA: Diagnosis not present

## 2023-12-19 DIAGNOSIS — Z860100 Personal history of colon polyps, unspecified: Secondary | ICD-10-CM | POA: Diagnosis not present

## 2023-12-19 DIAGNOSIS — J301 Allergic rhinitis due to pollen: Secondary | ICD-10-CM | POA: Diagnosis not present

## 2023-12-19 DIAGNOSIS — J3081 Allergic rhinitis due to animal (cat) (dog) hair and dander: Secondary | ICD-10-CM | POA: Diagnosis not present

## 2023-12-19 DIAGNOSIS — J3089 Other allergic rhinitis: Secondary | ICD-10-CM | POA: Diagnosis not present

## 2023-12-19 LAB — GENETIC SCREENING ORDER

## 2023-12-19 NOTE — Progress Notes (Signed)
 REFERRING PROVIDER: Iva Boop, MD [address] [place],  [place] [address]  PRIMARY PROVIDER:  Garlan Fillers, MD  PRIMARY REASON FOR VISIT:  Encounter Diagnosis  Name Primary?   History of colonic polyps Yes     HISTORY OF PRESENT ILLNESS:   [name], a 73 y.o. male, was seen for a [place] cancer genetics consultation at the request of Dr. Leone Payor due to a personal history of colon poylps.  [name] presents to clinic today to discuss the possibility of a hereditary predisposition to cancer, to discuss genetic testing, and to further clarify his future cancer risks, as well as potential cancer risks for family members.   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  His polyp/colonscopy history is as follows:  March 2025: 12 tubular adenomas January 2021: no polyps September 2015: 2 hyperplastic polyps April 2011: 4 hyperplastic polyps October 2009: 15 polyps (tubular adenomas, sessile serrated, hyperplastic)  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    SCREENING/RISK FACTORS:  Colonoscopy: yes;  see history noted above; recall in 1 year . PSA screening: unknown per patient Dermatology screening: annually   Blood transfusion within past 4 weeks: no  Past Medical History:  Diagnosis Date   A-fib [facility])    AAA (abdominal aortic aneurysm) (HCC)    Allergy    hayfever   Arrhythmia    Colon polyps    Coronary artery disease    Hay fever    Hyperlipidemia    Hypertension    Nocturia    Personal history of colonic adenomas 12/22/2012   Rheumatoid arthritis (HCC)    Sleep apnea    Dental Device   Wears glasses     Past Surgical History:  Procedure Laterality Date   CARDIOVERSION N/A 02/20/2021   Procedure: CARDIOVERSION;  Surgeon: Quintella Reichert, MD;  Location: [facility] END[facility]COPY;  Service: Cardiovascular;   Laterality: N/A;   CARDIOVERSION N/A 03/10/2021   Procedure: CARDIOVERSION;  Surgeon: Sande Rives, MD;  Location: [facility] ENDOSCOPY;  Service: Cardiovascular;  Laterality: N/A;   COLONOSCOPY  multiple   INGUINAL HERNIA REPAIR     PARATHYROIDECTOMY N/A 09/02/2015   Procedure: PARATHYROIDECTOMY;  Surgeon: Darnell Level, MD;  Location: [facility] OR;  Service: General;  Laterality: N/A;   TONSILLECTOMY     as a child     FAMILY HISTORY:  We obtained a detailed, 4-generation family history.  Significant diagnoses are listed below: Family History  Problem Relation Age of Onset   Lung cancer Mother 52       smoking hx   Lung cancer Brother 7       or other primary? d. 19     Mr. Vickerman is unaware of previous family history of genetic testing for hereditary cancer risks.  Other relatives are unavailable for genetic testing at this time.    There is no reported Ashkenazi Jewish ancestry. There is no known consanguinity.  GENETIC COUNSELING ASSESSMENT: Mr. Wilkie is a 73 y.o. 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 We, therefore, discussed and recommended the following at today's visit.    DISCUSSION: We discussed that polyps in general are common, however, most people have fewer than 5 lifetime polyps.  When an individual  has 10 or more polyps we become concerned about an underlying polyposis syndrome.  The most common hereditary polyposis syndromes are Familial Adenomatous Polyposis (FAP), caused by mutations in the APC gene, and MUTYH-Associated Polyposis (MAP), caused by mutations in the MUTYH gene.  There are other genes that are associated with polyposis, such as NTHL1 and MSH3.  We discussed that testing is beneficial for several reasons, including knowing about cancer risks, identifying potential screening and risk-reduction options that may be appropriate, and to understand if other family members  could be at risk for colon polyps and/or cancer and allow them to undergo genetic testing.   We reviewed the characteristics, features and inheritance patterns of hereditary cancer syndromes. We also discussed genetic testing, including the appropriate family members to test, the process of testing, insurance coverage and turn-around-time for results. We discussed the implications of a negative, positive and/or variant of uncertain significant result. We recommended Mr. Situ pursue genetic testing for genes associated with colon polyps, colon cancer, other cancers, and primary hyperparathyroidism.  Mr. Raineri was offered a common hereditary cancer panel (~40 genes) and an expanded pan-cancer panel (~70 genes). Mr. Oak was informed of the benefits and limitations of each panel, including that expanded pan-cancer panels contain several genes that do not have clear management guidelines at this point in time.  We also discussed that as the number of genes included on a panel increases, the chances of variants of uncertain significance increases.  After considering the benefits and limitations of each gene panel, Mr. Stefan elected to have an expanded pan-cancer panel through [employer] [facility] [employer]   The [facility] CustomNext-Cancer +RNAinsight Panel (CancerNext-Expanded + prelim GI cancer genes) includes sequencing, deletion/duplication, and RNA analysis for the following 79 genes: AIP, ALK, APC, ATM, AXIN2, BAP1, BARD1, BMPR1A, BRCA1, BRCA2, BRIP1, CDC73, CDH1, CDK4, CDKN1B, CDKN2A, CEBPA, CHEK2, CTNNA1, DDX41, DICER1, EGFR, EPCAM, ETV6, FH, FLCN, GATA2, GREM1, HOXB13, KIT, LZTR1, MAX, MBD4, MEN1, MET, MITF, MLH1, MLH3, MSH2, MSH3, MSH6, MUTYH, NF1, NF2, NTHL1, PALB2, PDGFRA, PHOX2B, PMS2, POLD1, POLE, POT1, PRKAR1A, PTCH1, PTEN, RAD51C, RAD51D, RB1, RET, RNF43, RPS20, RUNX1, SDHA, SDHAF2, SDHB, SDHC, SDHD, SMAD4, SMARCA4, SMARCB1, SMARCE1, STK11, SUFU, TMEM127, TP53, TSC1, TSC2, VHL, WT1.    Based on Mr. 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 He is the most informative relative available for genetic testing.  Despite that he meets criteria, he may still have an out of pocket cost. We discussed that if his out of pocket cost for testing is over $100, the laboratory should contact them to discuss self-pay options and/or patient pay assistance programs.  Mr. Ibe opted to proceed with the self-pay price [id number]) as opposed to proceeding with insurance first.   We discussed the Genetic Information Non-Discrimination Act (GINA) of 2008, which helps protect individuals against genetic discrimination based on their genetic test results.  It impacts both health insurance and employment.  With health insurance, it protects against genetic test results being used for increased premiums or policy termination. For employment, it protects against hiring, firing and promoting decisions based on genetic test results.  GINA does not apply to those in the [employer], those who work for companies with less than 15 employees, and new life insurance or long-term disability insurance policies.  Health status due to a cancer diagnosis is not protected under GINA.  PLAN: After considering the risks, benefits, and limitations, Mr. Mears provided informed consent to pursue genetic testing and the  blood sample was sent to [facility] for analysis of the CustomNext-Cancer +RNAinsight Panel. Results should be available within approximately 3 weeks' time, at which point they will be disclosed by telephone to Mr. Maziarz, as will any additional recommendations warranted by these results. Mr. Hakim will receive a summary of his genetic counseling visit and a copy of his results once available. This information will also be available in Epic.   Mr. Thelen questions were answered to his satisfaction today. Our contact information was provided should additional questions  or concerns arise. Thank you for the referral and allowing [place] to share in the care of your patient.   Merida Alcantar M. Rennie Plowman, MS, [facility] Genetic Counselor [email or web address] (P) [phone]    40 minutes were spent on the date of the encounter in service to the patient including preparation, face-to-face consultation, documentation and care coordination.  The patient was seen alone.  Drs. Gunnar Bulla and/or Mosetta Putt were available to discuss this case as needed.   _______________________________________________________________________ For Office Staff:  Number of people involved in session: 1 Was an Intern/ student involved with case: no

## 2023-12-26 DIAGNOSIS — J3081 Allergic rhinitis due to animal (cat) (dog) hair and dander: Secondary | ICD-10-CM | POA: Diagnosis not present

## 2023-12-26 DIAGNOSIS — J301 Allergic rhinitis due to pollen: Secondary | ICD-10-CM | POA: Diagnosis not present

## 2023-12-26 DIAGNOSIS — J3089 Other allergic rhinitis: Secondary | ICD-10-CM | POA: Diagnosis not present

## 2024-01-01 ENCOUNTER — Ambulatory Visit: Payer: Self-pay | Admitting: Genetic Counselor

## 2024-01-01 ENCOUNTER — Encounter: Payer: Self-pay | Admitting: Genetic Counselor

## 2024-01-01 ENCOUNTER — Telehealth: Payer: Self-pay | Admitting: Genetic Counselor

## 2024-01-01 ENCOUNTER — Encounter: Payer: Self-pay | Admitting: Cardiovascular Disease

## 2024-01-01 DIAGNOSIS — Z1379 Encounter for other screening for genetic and chromosomal anomalies: Secondary | ICD-10-CM | POA: Insufficient documentation

## 2024-01-01 NOTE — Telephone Encounter (Addendum)
 I contacted Mr. Maciolek to discuss his genetic testing results. No pathogenic variants were identified in the 79 genes analyzed. Detailed clinic note to follow.  The test report has been scanned into EPIC and is located under the Molecular Pathology section of the Results Review tab.  A portion of the result report is included below for reference.   Rickeya Manus, MS, [facility] Genetic Counselor [email or web address] (P) [phone]

## 2024-01-01 NOTE — Progress Notes (Signed)
 HPI:   Mr. Ra was previously seen in the [facility] due to a personal history of colon polyps and concerns regarding a hereditary predisposition to cancer. Please refer to our prior cancer genetics clinic note for more information regarding our discussion, assessment and recommendations, at the time. Mr. Nardozzi recent genetic test results were disclosed to him, as were recommendations warranted by these results. These results and recommendations are discussed in more detail below.  CANCER HISTORY:  Oncology History   No history exists.    FAMILY HISTORY:  We obtained a detailed, 4-generation family history.  Significant diagnoses are listed below:      Family History  Problem Relation Age of Onset   Lung cancer Mother 28        smoking hx   Lung cancer Brother 55        or other primary? d. 19       Mr. Crull is unaware of previous family history of genetic testing for hereditary cancer risks.  Other relatives are unavailable for genetic testing at this time.    There is no reported Ashkenazi Jewish ancestry. There is no known consanguinity.  GENETIC TEST RESULTS:  The [facility] CustomNext Panel found no pathogenic mutations.   The [facility] CustomNext-Cancer +RNAinsight Panel (CancerNext-Expanded + prelim GI cancer genes) includes sequencing, deletion/duplication, and RNA analysis for the following 79 genes: AIP, ALK, APC, ATM, AXIN2, BAP1, BARD1, BMPR1A, BRCA1, BRCA2, BRIP1, CDC73, CDH1, CDK4, CDKN1B, CDKN2A, CEBPA, CHEK2, CTNNA1, DDX41, DICER1, EGFR, EPCAM, ETV6, FH, FLCN, GATA2, GREM1, HOXB13, KIT, LZTR1, MAX, MBD4, MEN1, MET, MITF, MLH1, MLH3, MSH2, MSH3, MSH6, MUTYH, NF1, NF2, NTHL1, PALB2, PDGFRA, PHOX2B, PMS2, POLD1, POLE, POT1, PRKAR1A, PTCH1, PTEN, RAD51C, RAD51D, RB1, RET, RNF43, RPS20, RUNX1, SDHA, SDHAF2, SDHB, SDHC, SDHD, SMAD4, SMARCA4, SMARCB1, SMARCE1, STK11, SUFU, TMEM127, TP53, TSC1, TSC2, VHL, WT1.    The test report has been scanned into EPIC  and is located under the Molecular Pathology section of the Results Review tab.  A portion of the result report is included below for reference. Genetic testing reported out on 12/28/2023.       Even though a pathogenic variant was not identified, possible explanations for his personal history of colon polyps may include: There may be no hereditary risk for polyposis/cancer in the family. His history of colon polyps may be due to other genetic or environmental factors. There may be a gene mutation in one of these genes that current testing methods cannot detect, but that chance is small. There could be another gene that has not yet been discovered, or that we have not yet tested, that is responsible for his history of colon polyps.   Therefore, it is important to remain in touch with cancer genetics in the future so that we can continue to offer Mr. Goytia the most up to date genetic testing.   ADDITIONAL GENETIC TESTING:  We discussed with Mr. Humphres that his genetic testing was fairly extensive.  If there are genes identified to increase polyposis/cancer risk that can be analyzed in the future, we would be happy to discuss and coordinate this testing at that time.    CANCER SCREENING RECOMMENDATIONS:  Mr. Renault test result is considered negative (normal).  This means that we have not identified a hereditary cause for his personal history of colon polyps at this time.   An individual's cancer risk and medical management are not determined by genetic test results alone. Overall cancer risk assessment incorporates additional factors,  including personal medical history, family history, and any available genetic information that may result in a personalized plan for cancer prevention and surveillance. Therefore, it is recommended he continue to follow the cancer management and screening guidelines provided by his healthcare providers.  RECOMMENDATIONS FOR FAMILY MEMBERS:   Since he did not  inherit a mutation in a cancer predisposition gene included on this panel, his children could not have inherited a mutation from him in one of these genes. His children may be at an increased risk for colon polyps. It is important they discuss Mr. Collignon's history of colon polyps with their providers to determine if they recommend earlier/more frequent colonoscopies.  FOLLOW-UP:  Cancer genetics is a rapidly advancing field and it is possible that new genetic tests will be appropriate for him and/or his family members in the future. We encouraged him to remain in contact with cancer genetics on an annual basis so we can update his personal and family histories and let him know of advances in cancer genetics that may benefit this family.   Our contact number was provided. Mr. Hodkinson questions were answered to his satisfaction, and he knows he is welcome to call us  at anytime with additional questions or concerns.   Jazleen Robeck, MS, [facility] Genetic Counselor [email or web address] (P) [phone]

## 2024-01-02 DIAGNOSIS — J301 Allergic rhinitis due to pollen: Secondary | ICD-10-CM | POA: Diagnosis not present

## 2024-01-02 DIAGNOSIS — J3081 Allergic rhinitis due to animal (cat) (dog) hair and dander: Secondary | ICD-10-CM | POA: Diagnosis not present

## 2024-01-02 DIAGNOSIS — J3089 Other allergic rhinitis: Secondary | ICD-10-CM | POA: Diagnosis not present

## 2024-01-16 DIAGNOSIS — M0579 Rheumatoid arthritis with rheumatoid factor of multiple sites without organ or systems involvement: Secondary | ICD-10-CM | POA: Diagnosis not present

## 2024-01-17 DIAGNOSIS — J3081 Allergic rhinitis due to animal (cat) (dog) hair and dander: Secondary | ICD-10-CM | POA: Diagnosis not present

## 2024-01-17 DIAGNOSIS — J3089 Other allergic rhinitis: Secondary | ICD-10-CM | POA: Diagnosis not present

## 2024-01-17 DIAGNOSIS — J301 Allergic rhinitis due to pollen: Secondary | ICD-10-CM | POA: Diagnosis not present

## 2024-01-23 DIAGNOSIS — J3081 Allergic rhinitis due to animal (cat) (dog) hair and dander: Secondary | ICD-10-CM | POA: Diagnosis not present

## 2024-01-23 DIAGNOSIS — J3089 Other allergic rhinitis: Secondary | ICD-10-CM | POA: Diagnosis not present

## 2024-01-23 DIAGNOSIS — J301 Allergic rhinitis due to pollen: Secondary | ICD-10-CM | POA: Diagnosis not present

## 2024-01-29 DIAGNOSIS — Z79899 Other long term (current) drug therapy: Secondary | ICD-10-CM | POA: Diagnosis not present

## 2024-01-29 DIAGNOSIS — M1991 Primary osteoarthritis, unspecified site: Secondary | ICD-10-CM | POA: Diagnosis not present

## 2024-01-29 DIAGNOSIS — L409 Psoriasis, unspecified: Secondary | ICD-10-CM | POA: Diagnosis not present

## 2024-01-29 DIAGNOSIS — M0579 Rheumatoid arthritis with rheumatoid factor of multiple sites without organ or systems involvement: Secondary | ICD-10-CM | POA: Diagnosis not present

## 2024-01-30 DIAGNOSIS — J301 Allergic rhinitis due to pollen: Secondary | ICD-10-CM | POA: Diagnosis not present

## 2024-01-30 DIAGNOSIS — J3089 Other allergic rhinitis: Secondary | ICD-10-CM | POA: Diagnosis not present

## 2024-01-30 DIAGNOSIS — J3081 Allergic rhinitis due to animal (cat) (dog) hair and dander: Secondary | ICD-10-CM | POA: Diagnosis not present

## 2024-02-07 DIAGNOSIS — J3089 Other allergic rhinitis: Secondary | ICD-10-CM | POA: Diagnosis not present

## 2024-02-07 DIAGNOSIS — J301 Allergic rhinitis due to pollen: Secondary | ICD-10-CM | POA: Diagnosis not present

## 2024-02-07 DIAGNOSIS — J3081 Allergic rhinitis due to animal (cat) (dog) hair and dander: Secondary | ICD-10-CM | POA: Diagnosis not present

## 2024-02-13 DIAGNOSIS — J3081 Allergic rhinitis due to animal (cat) (dog) hair and dander: Secondary | ICD-10-CM | POA: Diagnosis not present

## 2024-02-13 DIAGNOSIS — J301 Allergic rhinitis due to pollen: Secondary | ICD-10-CM | POA: Diagnosis not present

## 2024-02-13 DIAGNOSIS — J3089 Other allergic rhinitis: Secondary | ICD-10-CM | POA: Diagnosis not present

## 2024-03-11 DIAGNOSIS — J3081 Allergic rhinitis due to animal (cat) (dog) hair and dander: Secondary | ICD-10-CM | POA: Diagnosis not present

## 2024-03-11 DIAGNOSIS — J301 Allergic rhinitis due to pollen: Secondary | ICD-10-CM | POA: Diagnosis not present

## 2024-03-11 DIAGNOSIS — J3089 Other allergic rhinitis: Secondary | ICD-10-CM | POA: Diagnosis not present

## 2024-03-12 DIAGNOSIS — M0579 Rheumatoid arthritis with rheumatoid factor of multiple sites without organ or systems involvement: Secondary | ICD-10-CM | POA: Diagnosis not present

## 2024-03-16 DIAGNOSIS — M1712 Unilateral primary osteoarthritis, left knee: Secondary | ICD-10-CM | POA: Diagnosis not present

## 2024-03-26 DIAGNOSIS — J3081 Allergic rhinitis due to animal (cat) (dog) hair and dander: Secondary | ICD-10-CM | POA: Diagnosis not present

## 2024-03-26 DIAGNOSIS — J3089 Other allergic rhinitis: Secondary | ICD-10-CM | POA: Diagnosis not present

## 2024-03-26 DIAGNOSIS — J301 Allergic rhinitis due to pollen: Secondary | ICD-10-CM | POA: Diagnosis not present

## 2024-04-03 DIAGNOSIS — J3081 Allergic rhinitis due to animal (cat) (dog) hair and dander: Secondary | ICD-10-CM | POA: Diagnosis not present

## 2024-04-03 DIAGNOSIS — J301 Allergic rhinitis due to pollen: Secondary | ICD-10-CM | POA: Diagnosis not present

## 2024-04-03 DIAGNOSIS — J3089 Other allergic rhinitis: Secondary | ICD-10-CM | POA: Diagnosis not present

## 2024-04-08 DIAGNOSIS — K080 Exfoliation of teeth due to systemic causes: Secondary | ICD-10-CM | POA: Diagnosis not present

## 2024-04-09 DIAGNOSIS — J301 Allergic rhinitis due to pollen: Secondary | ICD-10-CM | POA: Diagnosis not present

## 2024-04-09 DIAGNOSIS — J3081 Allergic rhinitis due to animal (cat) (dog) hair and dander: Secondary | ICD-10-CM | POA: Diagnosis not present

## 2024-04-09 DIAGNOSIS — J3089 Other allergic rhinitis: Secondary | ICD-10-CM | POA: Diagnosis not present

## 2024-04-17 DIAGNOSIS — J301 Allergic rhinitis due to pollen: Secondary | ICD-10-CM | POA: Diagnosis not present

## 2024-04-17 DIAGNOSIS — J3081 Allergic rhinitis due to animal (cat) (dog) hair and dander: Secondary | ICD-10-CM | POA: Diagnosis not present

## 2024-04-17 DIAGNOSIS — J3089 Other allergic rhinitis: Secondary | ICD-10-CM | POA: Diagnosis not present

## 2024-04-27 DIAGNOSIS — Z79899 Other long term (current) drug therapy: Secondary | ICD-10-CM | POA: Diagnosis not present

## 2024-04-27 DIAGNOSIS — M069 Rheumatoid arthritis, unspecified: Secondary | ICD-10-CM | POA: Diagnosis not present

## 2024-04-27 DIAGNOSIS — H04123 Dry eye syndrome of bilateral lacrimal glands: Secondary | ICD-10-CM | POA: Diagnosis not present

## 2024-04-27 DIAGNOSIS — H1045 Other chronic allergic conjunctivitis: Secondary | ICD-10-CM | POA: Diagnosis not present

## 2024-04-30 DIAGNOSIS — L814 Other melanin hyperpigmentation: Secondary | ICD-10-CM | POA: Diagnosis not present

## 2024-04-30 DIAGNOSIS — D225 Melanocytic nevi of trunk: Secondary | ICD-10-CM | POA: Diagnosis not present

## 2024-04-30 DIAGNOSIS — L821 Other seborrheic keratosis: Secondary | ICD-10-CM | POA: Diagnosis not present

## 2024-04-30 DIAGNOSIS — J3089 Other allergic rhinitis: Secondary | ICD-10-CM | POA: Diagnosis not present

## 2024-04-30 DIAGNOSIS — J3081 Allergic rhinitis due to animal (cat) (dog) hair and dander: Secondary | ICD-10-CM | POA: Diagnosis not present

## 2024-04-30 DIAGNOSIS — J301 Allergic rhinitis due to pollen: Secondary | ICD-10-CM | POA: Diagnosis not present

## 2024-04-30 DIAGNOSIS — L918 Other hypertrophic disorders of the skin: Secondary | ICD-10-CM | POA: Diagnosis not present

## 2024-05-07 DIAGNOSIS — M0579 Rheumatoid arthritis with rheumatoid factor of multiple sites without organ or systems involvement: Secondary | ICD-10-CM | POA: Diagnosis not present

## 2024-05-07 DIAGNOSIS — J301 Allergic rhinitis due to pollen: Secondary | ICD-10-CM | POA: Diagnosis not present

## 2024-05-07 DIAGNOSIS — J3089 Other allergic rhinitis: Secondary | ICD-10-CM | POA: Diagnosis not present

## 2024-05-07 DIAGNOSIS — J3081 Allergic rhinitis due to animal (cat) (dog) hair and dander: Secondary | ICD-10-CM | POA: Diagnosis not present

## 2024-05-20 DIAGNOSIS — J3081 Allergic rhinitis due to animal (cat) (dog) hair and dander: Secondary | ICD-10-CM | POA: Diagnosis not present

## 2024-05-20 DIAGNOSIS — J3089 Other allergic rhinitis: Secondary | ICD-10-CM | POA: Diagnosis not present

## 2024-05-20 DIAGNOSIS — J301 Allergic rhinitis due to pollen: Secondary | ICD-10-CM | POA: Diagnosis not present

## 2024-05-27 DIAGNOSIS — J301 Allergic rhinitis due to pollen: Secondary | ICD-10-CM | POA: Diagnosis not present

## 2024-05-27 DIAGNOSIS — J3081 Allergic rhinitis due to animal (cat) (dog) hair and dander: Secondary | ICD-10-CM | POA: Diagnosis not present

## 2024-05-27 DIAGNOSIS — J3089 Other allergic rhinitis: Secondary | ICD-10-CM | POA: Diagnosis not present

## 2024-06-04 DIAGNOSIS — J301 Allergic rhinitis due to pollen: Secondary | ICD-10-CM | POA: Diagnosis not present

## 2024-06-04 DIAGNOSIS — J3089 Other allergic rhinitis: Secondary | ICD-10-CM | POA: Diagnosis not present

## 2024-06-04 DIAGNOSIS — J3081 Allergic rhinitis due to animal (cat) (dog) hair and dander: Secondary | ICD-10-CM | POA: Diagnosis not present

## 2024-06-11 DIAGNOSIS — Z125 Encounter for screening for malignant neoplasm of prostate: Secondary | ICD-10-CM | POA: Diagnosis not present

## 2024-06-11 DIAGNOSIS — Z0189 Encounter for other specified special examinations: Secondary | ICD-10-CM | POA: Diagnosis not present

## 2024-06-11 DIAGNOSIS — J3089 Other allergic rhinitis: Secondary | ICD-10-CM | POA: Diagnosis not present

## 2024-06-11 DIAGNOSIS — J3081 Allergic rhinitis due to animal (cat) (dog) hair and dander: Secondary | ICD-10-CM | POA: Diagnosis not present

## 2024-06-11 DIAGNOSIS — J301 Allergic rhinitis due to pollen: Secondary | ICD-10-CM | POA: Diagnosis not present

## 2024-06-11 DIAGNOSIS — Z1212 Encounter for screening for malignant neoplasm of rectum: Secondary | ICD-10-CM | POA: Diagnosis not present

## 2024-06-11 DIAGNOSIS — M8580 Other specified disorders of bone density and structure, unspecified site: Secondary | ICD-10-CM | POA: Diagnosis not present

## 2024-06-12 DIAGNOSIS — E785 Hyperlipidemia, unspecified: Secondary | ICD-10-CM | POA: Diagnosis not present

## 2024-06-12 DIAGNOSIS — R739 Hyperglycemia, unspecified: Secondary | ICD-10-CM | POA: Diagnosis not present

## 2024-06-18 DIAGNOSIS — J3081 Allergic rhinitis due to animal (cat) (dog) hair and dander: Secondary | ICD-10-CM | POA: Diagnosis not present

## 2024-06-18 DIAGNOSIS — J3089 Other allergic rhinitis: Secondary | ICD-10-CM | POA: Diagnosis not present

## 2024-06-18 DIAGNOSIS — J301 Allergic rhinitis due to pollen: Secondary | ICD-10-CM | POA: Diagnosis not present

## 2024-06-19 DIAGNOSIS — Z1339 Encounter for screening examination for other mental health and behavioral disorders: Secondary | ICD-10-CM | POA: Diagnosis not present

## 2024-06-19 DIAGNOSIS — Z1331 Encounter for screening for depression: Secondary | ICD-10-CM | POA: Diagnosis not present

## 2024-06-19 DIAGNOSIS — Z0000 Encounter for general adult medical examination without abnormal findings: Secondary | ICD-10-CM | POA: Diagnosis not present

## 2024-06-19 DIAGNOSIS — R82998 Other abnormal findings in urine: Secondary | ICD-10-CM | POA: Diagnosis not present

## 2024-06-19 DIAGNOSIS — M069 Rheumatoid arthritis, unspecified: Secondary | ICD-10-CM | POA: Diagnosis not present

## 2024-06-19 DIAGNOSIS — I10 Essential (primary) hypertension: Secondary | ICD-10-CM | POA: Diagnosis not present

## 2024-06-22 DIAGNOSIS — J3089 Other allergic rhinitis: Secondary | ICD-10-CM | POA: Diagnosis not present

## 2024-06-22 DIAGNOSIS — J3081 Allergic rhinitis due to animal (cat) (dog) hair and dander: Secondary | ICD-10-CM | POA: Diagnosis not present

## 2024-06-22 DIAGNOSIS — J301 Allergic rhinitis due to pollen: Secondary | ICD-10-CM | POA: Diagnosis not present

## 2024-06-24 DIAGNOSIS — K080 Exfoliation of teeth due to systemic causes: Secondary | ICD-10-CM | POA: Diagnosis not present

## 2024-06-25 DIAGNOSIS — J3089 Other allergic rhinitis: Secondary | ICD-10-CM | POA: Diagnosis not present

## 2024-06-25 DIAGNOSIS — J3081 Allergic rhinitis due to animal (cat) (dog) hair and dander: Secondary | ICD-10-CM | POA: Diagnosis not present

## 2024-06-25 DIAGNOSIS — J301 Allergic rhinitis due to pollen: Secondary | ICD-10-CM | POA: Diagnosis not present

## 2024-07-02 DIAGNOSIS — M0579 Rheumatoid arthritis with rheumatoid factor of multiple sites without organ or systems involvement: Secondary | ICD-10-CM | POA: Diagnosis not present

## 2024-07-02 DIAGNOSIS — Z111 Encounter for screening for respiratory tuberculosis: Secondary | ICD-10-CM | POA: Diagnosis not present

## 2024-07-02 DIAGNOSIS — R5383 Other fatigue: Secondary | ICD-10-CM | POA: Diagnosis not present

## 2024-07-09 DIAGNOSIS — J3081 Allergic rhinitis due to animal (cat) (dog) hair and dander: Secondary | ICD-10-CM | POA: Diagnosis not present

## 2024-07-09 DIAGNOSIS — J301 Allergic rhinitis due to pollen: Secondary | ICD-10-CM | POA: Diagnosis not present

## 2024-07-09 DIAGNOSIS — J3089 Other allergic rhinitis: Secondary | ICD-10-CM | POA: Diagnosis not present

## 2024-07-16 DIAGNOSIS — J301 Allergic rhinitis due to pollen: Secondary | ICD-10-CM | POA: Diagnosis not present

## 2024-07-16 DIAGNOSIS — J3089 Other allergic rhinitis: Secondary | ICD-10-CM | POA: Diagnosis not present

## 2024-07-16 DIAGNOSIS — J3081 Allergic rhinitis due to animal (cat) (dog) hair and dander: Secondary | ICD-10-CM | POA: Diagnosis not present

## 2024-07-23 DIAGNOSIS — J3089 Other allergic rhinitis: Secondary | ICD-10-CM | POA: Diagnosis not present

## 2024-07-23 DIAGNOSIS — J301 Allergic rhinitis due to pollen: Secondary | ICD-10-CM | POA: Diagnosis not present

## 2024-07-23 DIAGNOSIS — J3081 Allergic rhinitis due to animal (cat) (dog) hair and dander: Secondary | ICD-10-CM | POA: Diagnosis not present

## 2024-07-28 ENCOUNTER — Encounter: Payer: Self-pay | Admitting: Cardiovascular Disease

## 2024-07-29 DIAGNOSIS — J301 Allergic rhinitis due to pollen: Secondary | ICD-10-CM | POA: Diagnosis not present

## 2024-07-29 DIAGNOSIS — J3089 Other allergic rhinitis: Secondary | ICD-10-CM | POA: Diagnosis not present

## 2024-07-29 DIAGNOSIS — L409 Psoriasis, unspecified: Secondary | ICD-10-CM | POA: Diagnosis not present

## 2024-07-29 DIAGNOSIS — M0579 Rheumatoid arthritis with rheumatoid factor of multiple sites without organ or systems involvement: Secondary | ICD-10-CM | POA: Diagnosis not present

## 2024-07-29 DIAGNOSIS — M1991 Primary osteoarthritis, unspecified site: Secondary | ICD-10-CM | POA: Diagnosis not present

## 2024-07-29 DIAGNOSIS — Z79899 Other long term (current) drug therapy: Secondary | ICD-10-CM | POA: Diagnosis not present

## 2024-07-29 DIAGNOSIS — J3081 Allergic rhinitis due to animal (cat) (dog) hair and dander: Secondary | ICD-10-CM | POA: Diagnosis not present

## 2024-08-05 DIAGNOSIS — J301 Allergic rhinitis due to pollen: Secondary | ICD-10-CM | POA: Diagnosis not present

## 2024-08-05 DIAGNOSIS — J3089 Other allergic rhinitis: Secondary | ICD-10-CM | POA: Diagnosis not present

## 2024-08-05 DIAGNOSIS — J3081 Allergic rhinitis due to animal (cat) (dog) hair and dander: Secondary | ICD-10-CM | POA: Diagnosis not present

## 2024-08-09 ENCOUNTER — Other Ambulatory Visit: Payer: Self-pay | Admitting: Cardiovascular Disease

## 2024-08-31 DIAGNOSIS — M0579 Rheumatoid arthritis with rheumatoid factor of multiple sites without organ or systems involvement: Secondary | ICD-10-CM | POA: Diagnosis not present

## 2024-09-01 ENCOUNTER — Other Ambulatory Visit: Payer: Self-pay | Admitting: Cardiovascular Disease

## 2024-09-01 NOTE — Telephone Encounter (Signed)
 Prescription refill request for Eliquis  received. Indication:afib Last office visit:needs appt Scr: Age:  Weight:  Prescription refilled

## 2024-09-11 ENCOUNTER — Other Ambulatory Visit: Payer: Self-pay | Admitting: Cardiovascular Disease

## 2024-09-11 MED ORDER — ROSUVASTATIN CALCIUM 20 MG PO TABS: 20.0000 mg | ORAL_TABLET | Freq: Every day | ORAL | 0 refills | Status: DC

## 2024-09-28 ENCOUNTER — Other Ambulatory Visit: Payer: Self-pay

## 2024-09-29 MED ORDER — APIXABAN 5 MG PO TABS: 5.0000 mg | ORAL_TABLET | Freq: Two times a day (BID) | ORAL | 0 refills | Status: DC

## 2024-10-08 ENCOUNTER — Other Ambulatory Visit: Payer: Self-pay | Admitting: Cardiovascular Disease

## 2024-10-12 ENCOUNTER — Other Ambulatory Visit: Payer: Self-pay | Admitting: Internal Medicine

## 2024-10-12 ENCOUNTER — Other Ambulatory Visit: Payer: Self-pay | Admitting: Cardiovascular Disease

## 2024-10-12 DIAGNOSIS — I4819 Other persistent atrial fibrillation: Secondary | ICD-10-CM

## 2024-10-12 NOTE — Telephone Encounter (Signed)
 Eliquis  5mg  refill request received. Patient is 74 years old, weight-96.4kg, Crea-0.89 on 07/02/24 via Care Everywhere from [facility], Diagnosis-Afib, and last seen by Dr. Barbaraann on 08/14/23-needs appt. Dose is appropriate based on dosing criteria.   Pt needs an appt. Sending message to [facility] [place]

## 2024-10-13 ENCOUNTER — Other Ambulatory Visit: Payer: Self-pay | Admitting: Cardiovascular Disease

## 2024-10-13 MED ORDER — ROSUVASTATIN CALCIUM 20 MG PO TABS: 20.0000 mg | ORAL_TABLET | Freq: Every day | ORAL | 0 refills | Status: DC

## 2024-12-25 ENCOUNTER — Ambulatory Visit: Admitting: Cardiovascular Disease
